# Patient Record
Sex: Female | Born: 1960
Health system: Southern US, Community
[De-identification: ages and names within clinical notes are randomized; demographics above are authoritative.]

## PROBLEM LIST (undated history)

## (undated) DIAGNOSIS — E7800 Pure hypercholesterolemia, unspecified: Secondary | ICD-10-CM

## (undated) DIAGNOSIS — J449 Chronic obstructive pulmonary disease, unspecified: Secondary | ICD-10-CM

## (undated) DIAGNOSIS — I10 Essential (primary) hypertension: Secondary | ICD-10-CM

## (undated) DIAGNOSIS — R7303 Prediabetes: Secondary | ICD-10-CM

## (undated) DIAGNOSIS — I509 Heart failure, unspecified: Secondary | ICD-10-CM

## (undated) DIAGNOSIS — I739 Peripheral vascular disease, unspecified: Secondary | ICD-10-CM

## (undated) DIAGNOSIS — R911 Solitary pulmonary nodule: Secondary | ICD-10-CM

## (undated) DIAGNOSIS — I6529 Occlusion and stenosis of unspecified carotid artery: Secondary | ICD-10-CM

## (undated) DIAGNOSIS — M1990 Unspecified osteoarthritis, unspecified site: Secondary | ICD-10-CM

## (undated) DIAGNOSIS — I701 Atherosclerosis of renal artery: Secondary | ICD-10-CM

## (undated) DIAGNOSIS — D649 Anemia, unspecified: Secondary | ICD-10-CM

## (undated) DIAGNOSIS — Z87442 Personal history of urinary calculi: Secondary | ICD-10-CM

## (undated) DIAGNOSIS — I2510 Atherosclerotic heart disease of native coronary artery without angina pectoris: Secondary | ICD-10-CM

## (undated) HISTORY — DX: Pure hypercholesterolemia, unspecified: E78.00

## (undated) HISTORY — DX: Peripheral vascular disease, unspecified: I73.9

## (undated) HISTORY — PX: CARDIAC CATHETERIZATION: SHX172

## (undated) HISTORY — PX: GALLBLADDER SURGERY: SHX652

## (undated) HISTORY — DX: Chronic obstructive pulmonary disease, unspecified: J44.9

## (undated) HISTORY — DX: Occlusion and stenosis of unspecified carotid artery: I65.29

## (undated) HISTORY — DX: Solitary pulmonary nodule: R91.1

## (undated) HISTORY — DX: Heart failure, unspecified: I50.9

## (undated) HISTORY — DX: Essential (primary) hypertension: I10

## (undated) HISTORY — PX: CHOLECYSTECTOMY: SHX55

## (undated) HISTORY — DX: Unspecified osteoarthritis, unspecified site: M19.90

## (undated) HISTORY — DX: Atherosclerotic heart disease of native coronary artery without angina pectoris: I25.10

## (undated) HISTORY — PX: EXTERNAL EAR SURGERY: SHX627

## (undated) HISTORY — DX: Anemia, unspecified: D64.9

## (undated) HISTORY — DX: Atherosclerosis of renal artery: I70.1

## (undated) HISTORY — PX: CORONARY STENT PLACEMENT: SHX1402

---

## 2000-09-29 ENCOUNTER — Encounter: Admission: RE | Admit: 2000-09-29 | Discharge: 2000-09-29 | Payer: Self-pay | Admitting: *Deleted

## 2000-09-29 ENCOUNTER — Encounter: Payer: Self-pay | Admitting: *Deleted

## 2000-10-07 ENCOUNTER — Encounter: Payer: Self-pay | Admitting: *Deleted

## 2000-10-07 ENCOUNTER — Encounter: Admission: RE | Admit: 2000-10-07 | Discharge: 2000-10-07 | Payer: Self-pay | Admitting: *Deleted

## 2001-05-17 ENCOUNTER — Ambulatory Visit (HOSPITAL_COMMUNITY): Admission: RE | Admit: 2001-05-17 | Discharge: 2001-05-17 | Payer: Self-pay | Admitting: Obstetrics and Gynecology

## 2001-05-17 ENCOUNTER — Encounter: Payer: Self-pay | Admitting: Obstetrics and Gynecology

## 2001-05-20 ENCOUNTER — Inpatient Hospital Stay (HOSPITAL_COMMUNITY): Admission: AD | Admit: 2001-05-20 | Discharge: 2001-05-20 | Payer: Self-pay | Admitting: Obstetrics and Gynecology

## 2001-05-31 ENCOUNTER — Ambulatory Visit (HOSPITAL_COMMUNITY): Admission: RE | Admit: 2001-05-31 | Discharge: 2001-05-31 | Payer: Self-pay | Admitting: Obstetrics and Gynecology

## 2001-05-31 ENCOUNTER — Encounter (INDEPENDENT_AMBULATORY_CARE_PROVIDER_SITE_OTHER): Payer: Self-pay

## 2002-10-21 ENCOUNTER — Encounter: Payer: Self-pay | Admitting: Obstetrics and Gynecology

## 2002-10-21 ENCOUNTER — Ambulatory Visit (HOSPITAL_COMMUNITY): Admission: RE | Admit: 2002-10-21 | Discharge: 2002-10-21 | Payer: Self-pay | Admitting: Obstetrics and Gynecology

## 2002-12-07 ENCOUNTER — Encounter: Payer: Self-pay | Admitting: Obstetrics and Gynecology

## 2002-12-07 ENCOUNTER — Ambulatory Visit (HOSPITAL_COMMUNITY): Admission: RE | Admit: 2002-12-07 | Discharge: 2002-12-07 | Payer: Self-pay | Admitting: Obstetrics and Gynecology

## 2005-04-25 ENCOUNTER — Ambulatory Visit (HOSPITAL_COMMUNITY): Admission: RE | Admit: 2005-04-25 | Discharge: 2005-04-25 | Payer: Self-pay | Admitting: Obstetrics and Gynecology

## 2005-06-09 DIAGNOSIS — I509 Heart failure, unspecified: Secondary | ICD-10-CM

## 2005-06-09 HISTORY — DX: Heart failure, unspecified: I50.9

## 2005-07-28 ENCOUNTER — Encounter: Admission: RE | Admit: 2005-07-28 | Discharge: 2005-07-28 | Payer: Self-pay | Admitting: Family Medicine

## 2005-08-29 ENCOUNTER — Encounter: Admission: RE | Admit: 2005-08-29 | Discharge: 2005-08-29 | Payer: Self-pay | Admitting: Family Medicine

## 2006-05-31 ENCOUNTER — Ambulatory Visit: Payer: Self-pay | Admitting: Internal Medicine

## 2006-05-31 ENCOUNTER — Inpatient Hospital Stay (HOSPITAL_COMMUNITY): Admission: EM | Admit: 2006-05-31 | Discharge: 2006-06-05 | Payer: Self-pay | Admitting: Emergency Medicine

## 2006-06-01 ENCOUNTER — Encounter: Payer: Self-pay | Admitting: Internal Medicine

## 2006-06-16 ENCOUNTER — Ambulatory Visit: Payer: Self-pay | Admitting: Gastroenterology

## 2006-06-23 ENCOUNTER — Ambulatory Visit: Payer: Self-pay | Admitting: Cardiology

## 2006-06-30 ENCOUNTER — Ambulatory Visit: Payer: Self-pay | Admitting: Gastroenterology

## 2006-07-07 ENCOUNTER — Ambulatory Visit (HOSPITAL_COMMUNITY): Admission: RE | Admit: 2006-07-07 | Discharge: 2006-07-07 | Payer: Self-pay | Admitting: Gastroenterology

## 2006-07-08 ENCOUNTER — Ambulatory Visit: Payer: Self-pay | Admitting: Cardiology

## 2006-07-08 LAB — CONVERTED CEMR LAB
BUN: 6 mg/dL (ref 6–23)
Basophils Relative: 0.9 % (ref 0.0–1.0)
CO2: 30 meq/L (ref 19–32)
Calcium: 9.8 mg/dL (ref 8.4–10.5)
Chloride: 107 meq/L (ref 96–112)
Creatinine, Ser: 1.1 mg/dL (ref 0.4–1.2)
Eosinophils Relative: 6.7 % — ABNORMAL HIGH (ref 0.0–5.0)
GFR calc Af Amer: 69 mL/min
INR: 1 (ref 0.9–2.0)
Monocytes Relative: 7.5 % (ref 3.0–11.0)
Platelets: 284 10*3/uL (ref 150–400)
Prothrombin Time: 12.3 s (ref 10.0–14.0)
RBC: 4.02 M/uL (ref 3.87–5.11)
RDW: 17.3 % — ABNORMAL HIGH (ref 11.5–14.6)
WBC: 7.6 10*3/uL (ref 4.5–10.5)

## 2006-07-09 ENCOUNTER — Ambulatory Visit: Payer: Self-pay | Admitting: Gastroenterology

## 2006-07-09 ENCOUNTER — Encounter: Admission: RE | Admit: 2006-07-09 | Discharge: 2006-07-09 | Payer: Self-pay | Admitting: *Deleted

## 2006-07-14 ENCOUNTER — Ambulatory Visit: Payer: Self-pay | Admitting: Cardiology

## 2006-07-15 ENCOUNTER — Ambulatory Visit: Payer: Self-pay | Admitting: Vascular Surgery

## 2006-07-15 ENCOUNTER — Inpatient Hospital Stay (HOSPITAL_COMMUNITY): Admission: AD | Admit: 2006-07-15 | Discharge: 2006-07-16 | Payer: Self-pay | Admitting: Cardiology

## 2006-07-23 ENCOUNTER — Ambulatory Visit: Payer: Self-pay | Admitting: Cardiology

## 2006-07-23 LAB — CONVERTED CEMR LAB
BUN: 15 mg/dL (ref 6–23)
CO2: 25 meq/L (ref 19–32)
Calcium: 9.7 mg/dL (ref 8.4–10.5)
Chloride: 107 meq/L (ref 96–112)
Creatinine, Ser: 1.2 mg/dL (ref 0.4–1.2)
GFR calc Af Amer: 63 mL/min

## 2006-07-29 ENCOUNTER — Ambulatory Visit: Payer: Self-pay | Admitting: *Deleted

## 2006-07-30 ENCOUNTER — Ambulatory Visit: Payer: Self-pay | Admitting: Cardiology

## 2006-07-30 ENCOUNTER — Ambulatory Visit: Payer: Self-pay

## 2006-07-31 ENCOUNTER — Encounter (INDEPENDENT_AMBULATORY_CARE_PROVIDER_SITE_OTHER): Payer: Self-pay | Admitting: Specialist

## 2006-07-31 ENCOUNTER — Inpatient Hospital Stay (HOSPITAL_COMMUNITY): Admission: RE | Admit: 2006-07-31 | Discharge: 2006-08-02 | Payer: Self-pay | Admitting: *Deleted

## 2006-08-06 ENCOUNTER — Ambulatory Visit: Payer: Self-pay

## 2006-08-20 ENCOUNTER — Ambulatory Visit: Payer: Self-pay | Admitting: *Deleted

## 2006-09-08 ENCOUNTER — Emergency Department (HOSPITAL_COMMUNITY): Admission: EM | Admit: 2006-09-08 | Discharge: 2006-09-08 | Payer: Self-pay | Admitting: Emergency Medicine

## 2006-10-02 ENCOUNTER — Encounter: Admission: RE | Admit: 2006-10-02 | Discharge: 2006-10-02 | Payer: Self-pay | Admitting: Family Medicine

## 2007-01-05 ENCOUNTER — Ambulatory Visit: Payer: Self-pay | Admitting: Cardiology

## 2007-01-05 LAB — CONVERTED CEMR LAB
BUN: 12 mg/dL (ref 6–23)
CO2: 28 meq/L (ref 19–32)
Calcium: 9.8 mg/dL (ref 8.4–10.5)
Chloride: 102 meq/L (ref 96–112)
GFR calc Af Amer: 87 mL/min
GFR calc non Af Amer: 72 mL/min
Ketones, ur: NEGATIVE mg/dL
Leukocytes, UA: NEGATIVE
Nitrite: NEGATIVE
Urobilinogen, UA: 0.2 (ref 0.0–1.0)

## 2007-01-26 ENCOUNTER — Ambulatory Visit: Payer: Self-pay

## 2007-01-26 ENCOUNTER — Encounter: Payer: Self-pay | Admitting: Cardiology

## 2007-01-28 ENCOUNTER — Ambulatory Visit: Payer: Self-pay | Admitting: Internal Medicine

## 2007-02-04 ENCOUNTER — Ambulatory Visit: Payer: Self-pay

## 2007-03-04 ENCOUNTER — Ambulatory Visit: Payer: Self-pay | Admitting: *Deleted

## 2007-03-08 ENCOUNTER — Ambulatory Visit: Payer: Self-pay | Admitting: Cardiovascular Disease

## 2007-04-01 ENCOUNTER — Ambulatory Visit: Payer: Self-pay | Admitting: Cardiology

## 2007-04-01 ENCOUNTER — Inpatient Hospital Stay (HOSPITAL_COMMUNITY): Admission: EM | Admit: 2007-04-01 | Discharge: 2007-04-04 | Payer: Self-pay | Admitting: Emergency Medicine

## 2007-04-19 ENCOUNTER — Ambulatory Visit: Payer: Self-pay | Admitting: Internal Medicine

## 2007-04-22 ENCOUNTER — Ambulatory Visit (HOSPITAL_BASED_OUTPATIENT_CLINIC_OR_DEPARTMENT_OTHER): Admission: RE | Admit: 2007-04-22 | Discharge: 2007-04-22 | Payer: Self-pay | Admitting: Urology

## 2007-04-26 ENCOUNTER — Ambulatory Visit: Payer: Self-pay | Admitting: Internal Medicine

## 2007-04-26 LAB — CONVERTED CEMR LAB
AST: 16 units/L (ref 0–37)
Bilirubin, Direct: 0.1 mg/dL (ref 0.0–0.3)
Chloride: 102 meq/L (ref 96–112)
Cholesterol: 167 mg/dL (ref 0–200)
Creatinine, Ser: 1.2 mg/dL (ref 0.4–1.2)
Glucose, Bld: 109 mg/dL — ABNORMAL HIGH (ref 70–99)
HDL: 34.1 mg/dL — ABNORMAL LOW (ref >39.0)
LDL Cholesterol: 96 mg/dL (ref 0–99)
Sodium: 137 meq/L (ref 135–145)
Total Bilirubin: 0.9 mg/dL (ref 0.3–1.2)
Total Protein: 7.6 g/dL (ref 6.0–8.3)

## 2007-05-24 ENCOUNTER — Ambulatory Visit: Payer: Self-pay | Admitting: *Deleted

## 2007-05-24 ENCOUNTER — Encounter (INDEPENDENT_AMBULATORY_CARE_PROVIDER_SITE_OTHER): Payer: Self-pay | Admitting: *Deleted

## 2007-05-24 ENCOUNTER — Inpatient Hospital Stay (HOSPITAL_COMMUNITY): Admission: RE | Admit: 2007-05-24 | Discharge: 2007-05-25 | Payer: Self-pay | Admitting: *Deleted

## 2007-06-17 ENCOUNTER — Ambulatory Visit: Payer: Self-pay | Admitting: *Deleted

## 2007-07-28 ENCOUNTER — Ambulatory Visit: Payer: Self-pay | Admitting: Internal Medicine

## 2007-07-28 LAB — CONVERTED CEMR LAB
Basophils Relative: 0.8 % (ref 0.0–1.0)
CO2: 28 meq/L (ref 19–32)
Calcium: 9.6 mg/dL (ref 8.4–10.5)
Chloride: 101 meq/L (ref 96–112)
Eosinophils Absolute: 0.3 10*3/uL (ref 0.0–0.6)
Eosinophils Relative: 3.5 % (ref 0.0–5.0)
GFR calc non Af Amer: 32 mL/min
Glucose, Bld: 103 mg/dL — ABNORMAL HIGH (ref 70–99)
Platelets: 372 10*3/uL (ref 150–400)
RBC: 3.96 M/uL (ref 3.87–5.11)
WBC: 8.8 10*3/uL (ref 4.5–10.5)

## 2007-08-17 ENCOUNTER — Ambulatory Visit: Payer: Self-pay | Admitting: Internal Medicine

## 2007-08-17 ENCOUNTER — Ambulatory Visit: Payer: Self-pay | Admitting: Cardiovascular Disease

## 2007-08-17 LAB — CONVERTED CEMR LAB
Chloride: 106 meq/L (ref 96–112)
Creatinine, Ser: 1.2 mg/dL (ref 0.4–1.2)
Sodium: 140 meq/L (ref 135–145)

## 2007-09-13 ENCOUNTER — Ambulatory Visit: Payer: Self-pay

## 2007-09-21 ENCOUNTER — Ambulatory Visit: Payer: Self-pay | Admitting: Internal Medicine

## 2007-09-22 ENCOUNTER — Ambulatory Visit: Payer: Self-pay | Admitting: Cardiovascular Disease

## 2007-09-22 LAB — CONVERTED CEMR LAB
Basophils Absolute: 0 10*3/uL (ref 0.0–0.1)
Calcium: 9.3 mg/dL (ref 8.4–10.5)
Cholesterol: 154 mg/dL (ref 0–200)
Eosinophils Absolute: 0.2 10*3/uL (ref 0.0–0.7)
GFR calc Af Amer: 57 mL/min
GFR calc non Af Amer: 47 mL/min
HCT: 38.7 % (ref 36.0–46.0)
HDL: 37.1 mg/dL — ABNORMAL LOW (ref >39.0)
INR: 1 (ref 0.8–1.0)
LDL Cholesterol: 95 mg/dL (ref 0–99)
MCHC: 33.5 g/dL (ref 30.0–36.0)
MCV: 93.3 fL (ref 78.0–100.0)
Monocytes Absolute: 0.8 10*3/uL (ref 0.1–1.0)
Monocytes Relative: 11.8 % (ref 3.0–12.0)
Platelets: 288 10*3/uL (ref 150–400)
Potassium: 4.2 meq/L (ref 3.5–5.1)
RDW: 12.6 % (ref 11.5–14.6)
Sodium: 141 meq/L (ref 135–145)
VLDL: 22 mg/dL (ref 0–40)
aPTT: 30.3 s — ABNORMAL HIGH (ref 21.7–29.8)

## 2007-09-29 ENCOUNTER — Ambulatory Visit: Payer: Self-pay | Admitting: Cardiovascular Disease

## 2007-09-29 ENCOUNTER — Observation Stay (HOSPITAL_COMMUNITY): Admission: RE | Admit: 2007-09-29 | Discharge: 2007-09-30 | Payer: Self-pay | Admitting: Cardiovascular Disease

## 2007-10-15 ENCOUNTER — Ambulatory Visit: Payer: Self-pay

## 2007-10-19 ENCOUNTER — Ambulatory Visit: Payer: Self-pay | Admitting: Cardiovascular Disease

## 2007-11-25 ENCOUNTER — Ambulatory Visit: Payer: Self-pay | Admitting: *Deleted

## 2008-02-03 ENCOUNTER — Ambulatory Visit: Payer: Self-pay | Admitting: *Deleted

## 2008-03-15 ENCOUNTER — Observation Stay (HOSPITAL_COMMUNITY): Admission: RE | Admit: 2008-03-15 | Discharge: 2008-03-15 | Payer: Self-pay | Admitting: *Deleted

## 2008-03-15 ENCOUNTER — Ambulatory Visit: Payer: Self-pay | Admitting: *Deleted

## 2008-03-30 ENCOUNTER — Ambulatory Visit: Payer: Self-pay | Admitting: *Deleted

## 2008-04-13 ENCOUNTER — Ambulatory Visit: Payer: Self-pay

## 2008-05-03 ENCOUNTER — Ambulatory Visit: Payer: Self-pay | Admitting: Cardiovascular Disease

## 2008-05-10 ENCOUNTER — Ambulatory Visit: Payer: Self-pay | Admitting: *Deleted

## 2008-05-10 ENCOUNTER — Inpatient Hospital Stay (HOSPITAL_COMMUNITY): Admission: RE | Admit: 2008-05-10 | Discharge: 2008-05-17 | Payer: Self-pay | Admitting: *Deleted

## 2008-05-10 ENCOUNTER — Ambulatory Visit: Payer: Self-pay | Admitting: Internal Medicine

## 2008-05-11 ENCOUNTER — Encounter (INDEPENDENT_AMBULATORY_CARE_PROVIDER_SITE_OTHER): Payer: Self-pay | Admitting: *Deleted

## 2008-05-25 ENCOUNTER — Ambulatory Visit: Payer: Self-pay | Admitting: *Deleted

## 2008-06-22 ENCOUNTER — Ambulatory Visit: Payer: Self-pay | Admitting: *Deleted

## 2008-07-07 ENCOUNTER — Ambulatory Visit: Payer: Self-pay | Admitting: Cardiovascular Disease

## 2008-09-13 ENCOUNTER — Ambulatory Visit: Payer: Self-pay | Admitting: *Deleted

## 2008-10-09 ENCOUNTER — Encounter: Payer: Self-pay | Admitting: Cardiovascular Disease

## 2008-10-09 DIAGNOSIS — I701 Atherosclerosis of renal artery: Secondary | ICD-10-CM | POA: Insufficient documentation

## 2008-10-09 DIAGNOSIS — I70219 Atherosclerosis of native arteries of extremities with intermittent claudication, unspecified extremity: Secondary | ICD-10-CM | POA: Insufficient documentation

## 2008-10-18 ENCOUNTER — Ambulatory Visit: Payer: Self-pay

## 2008-10-18 ENCOUNTER — Encounter: Payer: Self-pay | Admitting: Cardiovascular Disease

## 2008-10-20 ENCOUNTER — Ambulatory Visit: Payer: Self-pay | Admitting: Internal Medicine

## 2008-10-20 ENCOUNTER — Telehealth: Payer: Self-pay | Admitting: Cardiovascular Disease

## 2008-10-20 DIAGNOSIS — I428 Other cardiomyopathies: Secondary | ICD-10-CM | POA: Insufficient documentation

## 2008-10-20 DIAGNOSIS — J449 Chronic obstructive pulmonary disease, unspecified: Secondary | ICD-10-CM | POA: Insufficient documentation

## 2008-10-20 DIAGNOSIS — E7800 Pure hypercholesterolemia, unspecified: Secondary | ICD-10-CM | POA: Insufficient documentation

## 2008-10-31 ENCOUNTER — Telehealth: Payer: Self-pay | Admitting: Cardiovascular Disease

## 2008-10-31 LAB — CONVERTED CEMR LAB
AST: 22 units/L (ref 0–37)
Alkaline Phosphatase: 107 units/L (ref 39–117)
Calcium: 9.5 mg/dL (ref 8.4–10.5)
Cholesterol: 134 mg/dL (ref 0–200)
GFR calc non Af Amer: 56.54 mL/min (ref >60)
HDL: 34 mg/dL — ABNORMAL LOW (ref >39.00)
LDL Cholesterol: 67 mg/dL (ref 0–99)
Potassium: 4.3 meq/L (ref 3.5–5.1)
Sodium: 141 meq/L (ref 135–145)
Total Bilirubin: 0.9 mg/dL (ref 0.3–1.2)
VLDL: 32.6 mg/dL (ref 0.0–40.0)

## 2008-11-03 DIAGNOSIS — I10 Essential (primary) hypertension: Secondary | ICD-10-CM | POA: Insufficient documentation

## 2008-11-03 DIAGNOSIS — D539 Nutritional anemia, unspecified: Secondary | ICD-10-CM | POA: Insufficient documentation

## 2008-11-03 DIAGNOSIS — I2510 Atherosclerotic heart disease of native coronary artery without angina pectoris: Secondary | ICD-10-CM | POA: Insufficient documentation

## 2008-11-03 DIAGNOSIS — J984 Other disorders of lung: Secondary | ICD-10-CM | POA: Insufficient documentation

## 2008-11-03 DIAGNOSIS — I6529 Occlusion and stenosis of unspecified carotid artery: Secondary | ICD-10-CM | POA: Insufficient documentation

## 2008-11-07 ENCOUNTER — Ambulatory Visit: Payer: Self-pay | Admitting: Cardiovascular Disease

## 2008-11-09 ENCOUNTER — Encounter: Payer: Self-pay | Admitting: Internal Medicine

## 2008-11-22 ENCOUNTER — Telehealth (INDEPENDENT_AMBULATORY_CARE_PROVIDER_SITE_OTHER): Payer: Self-pay | Admitting: *Deleted

## 2009-04-06 ENCOUNTER — Encounter: Payer: Self-pay | Admitting: Cardiovascular Disease

## 2009-04-09 ENCOUNTER — Ambulatory Visit: Payer: Self-pay

## 2009-04-09 ENCOUNTER — Ambulatory Visit: Payer: Self-pay | Admitting: Cardiovascular Disease

## 2009-04-18 LAB — CONVERTED CEMR LAB
BUN: 13 mg/dL (ref 6–23)
Calcium: 9 mg/dL (ref 8.4–10.5)
GFR calc non Af Amer: 56.43 mL/min (ref >60)
Glucose, Bld: 105 mg/dL — ABNORMAL HIGH (ref 70–99)
Potassium: 4.1 meq/L (ref 3.5–5.1)
Sodium: 142 meq/L (ref 135–145)

## 2009-04-19 ENCOUNTER — Ambulatory Visit: Payer: Self-pay | Admitting: Vascular Surgery

## 2009-04-27 ENCOUNTER — Encounter (INDEPENDENT_AMBULATORY_CARE_PROVIDER_SITE_OTHER): Payer: Self-pay | Admitting: *Deleted

## 2009-05-01 ENCOUNTER — Ambulatory Visit: Payer: Self-pay | Admitting: Cardiovascular Disease

## 2009-05-01 ENCOUNTER — Encounter (INDEPENDENT_AMBULATORY_CARE_PROVIDER_SITE_OTHER): Payer: Self-pay

## 2009-05-16 ENCOUNTER — Inpatient Hospital Stay (HOSPITAL_COMMUNITY): Admission: RE | Admit: 2009-05-16 | Discharge: 2009-05-17 | Payer: Self-pay | Admitting: Cardiovascular Disease

## 2009-05-16 ENCOUNTER — Ambulatory Visit: Payer: Self-pay | Admitting: Cardiovascular Disease

## 2009-05-18 ENCOUNTER — Encounter (INDEPENDENT_AMBULATORY_CARE_PROVIDER_SITE_OTHER): Payer: Self-pay | Admitting: *Deleted

## 2009-05-25 ENCOUNTER — Ambulatory Visit: Payer: Self-pay | Admitting: Cardiovascular Disease

## 2009-06-06 ENCOUNTER — Telehealth: Payer: Self-pay | Admitting: Cardiovascular Disease

## 2009-08-15 ENCOUNTER — Telehealth: Payer: Self-pay | Admitting: Cardiovascular Disease

## 2009-10-09 ENCOUNTER — Ambulatory Visit: Payer: Self-pay | Admitting: Vascular Surgery

## 2009-10-29 ENCOUNTER — Telehealth (INDEPENDENT_AMBULATORY_CARE_PROVIDER_SITE_OTHER): Payer: Self-pay | Admitting: *Deleted

## 2009-10-30 ENCOUNTER — Telehealth (INDEPENDENT_AMBULATORY_CARE_PROVIDER_SITE_OTHER): Payer: Self-pay | Admitting: *Deleted

## 2009-11-09 ENCOUNTER — Encounter: Payer: Self-pay | Admitting: Cardiovascular Disease

## 2009-11-12 ENCOUNTER — Ambulatory Visit: Payer: Self-pay

## 2009-11-12 ENCOUNTER — Encounter: Payer: Self-pay | Admitting: Cardiovascular Disease

## 2009-12-04 ENCOUNTER — Ambulatory Visit: Payer: Self-pay | Admitting: Cardiovascular Disease

## 2009-12-24 ENCOUNTER — Ambulatory Visit: Payer: Self-pay | Admitting: Vascular Surgery

## 2010-01-02 ENCOUNTER — Ambulatory Visit: Payer: Self-pay | Admitting: Cardiovascular Disease

## 2010-01-02 ENCOUNTER — Ambulatory Visit (HOSPITAL_COMMUNITY): Admission: RE | Admit: 2010-01-02 | Discharge: 2010-01-02 | Payer: Self-pay | Admitting: Cardiovascular Disease

## 2010-01-10 ENCOUNTER — Ambulatory Visit: Payer: Self-pay | Admitting: Vascular Surgery

## 2010-03-13 ENCOUNTER — Encounter: Payer: Self-pay | Admitting: Cardiovascular Disease

## 2010-03-14 ENCOUNTER — Ambulatory Visit: Payer: Self-pay

## 2010-03-14 ENCOUNTER — Ambulatory Visit: Payer: Self-pay | Admitting: Cardiovascular Disease

## 2010-03-20 LAB — CONVERTED CEMR LAB
ALT: 23 units/L (ref 0–35)
AST: 24 units/L (ref 0–37)
Bilirubin, Direct: 0.1 mg/dL (ref 0.0–0.3)
Cholesterol: 153 mg/dL (ref 0–200)
Total Bilirubin: 0.4 mg/dL (ref 0.3–1.2)
Total Protein: 7.6 g/dL (ref 6.0–8.3)
VLDL: 26.8 mg/dL (ref 0.0–40.0)

## 2010-05-22 ENCOUNTER — Encounter: Payer: Self-pay | Admitting: Cardiovascular Disease

## 2010-07-02 ENCOUNTER — Encounter: Payer: Self-pay | Admitting: Cardiovascular Disease

## 2010-07-02 ENCOUNTER — Ambulatory Visit: Admission: RE | Admit: 2010-07-02 | Discharge: 2010-07-02 | Payer: Self-pay | Source: Home / Self Care

## 2010-07-09 NOTE — Miscellaneous (Signed)
Summary: Orders Update  Clinical Lists Changes  Orders: Added new Test order of Arterial Duplex Lower Extremity (Arterial Duplex Low) - Signed 

## 2010-07-09 NOTE — Progress Notes (Signed)
  Phone Note Other Incoming   Request: Send information Summary of Call: Request for records received from [facility]. Request forwarded to [employer].      Appended Document:  [employer] Request Recieved sent to [facility]

## 2010-07-09 NOTE — Letter (Signed)
Summary: Peripheral Vascular  [facility], Main Office  [address]   [place], [place] [address]   Phone: [phone]  Fax: [phone]     12/04/2009 MRN: [id number]  Brenda [name] [address] [place], [place]  [address]  Dear Ms. Spradlin,   You are scheduled for Peripheral Vascular Angiogram on Wednesday January 02, 2010 with Dr. Excell Seltzer.  Please arrive at the [facility] at 6:30      a.m. on the day of your procedure.  1. DIET     _X___ Nothing to eat or drink after midnight except your medications with a sip of water.  2. MAKE SURE YOU TAKE YOUR ASPIRIN AND PLAVIX.  3. _X____ DO NOT TAKE these medications before your procedure:        Do not take Furosemide the morning of procedure.        __X__ YOU MAY TAKE ALL of your remaining medications with a small amount of water.       4. Plan for one night stay - bring personal belongings (i.e. toothpaste, toothbrush, etc.)  5. Bring a current list of your medications and current insurance cards.  6. Must have a responsible person to drive you home.   7. Someone must be with you for the first 24 hours after you arrive home.  8. Please wear clothes that are easy to get on and off and wear slip-on shoes.  *Special note: Every effort is made to have your procedure done on time.  Occasionally there are emergencies that present themselves at the hospital that may cause delays.  Please be patient if a delay does occur.  If you have any questions after you get home, please call the office at the number listed above.   Julieta Gutting, RN, BSN

## 2010-07-09 NOTE — Assessment & Plan Note (Signed)
Summary: 6 MO F/U   Visit Type:  6 months follow up  CC:  No complains.  History of Present Illness: 50 year-old woman with extensive peripheral vascular disease presents for follow-up evaluation today. She has lower extremity PAD, bilateral subclavian and extracrainial carotid stenosis, and renal artery stenosis. She underwent treatment of left renal in-stent restenosis earlier this year with a covered stent.   The patient's main problem at present is right leg claudication. She complains of pain in the right thigh and calf with low-level walking. She denies rest pain or ulceration. At the time of her last angiogram, she was noted to have severe right iliac stenosis.  She denies chest pain but does admit to exertional dyspnea. She complains of headache but denies focal neurologic symptoms.  Current Medications (verified): 1)  Bayer Aspirin 325 Mg Tabs (Aspirin) .... Take One Tablet Once Daily 2)  Astepro 137 Mcg/spray Soln (Azelastine Hcl) .... One Spray Each Nostril As Needed 3)  Tylenol Extra Strength 500 Mg Tabs (Acetaminophen) .... As Needed 4)  Simvastatin 80 Mg Tabs (Simvastatin) .... Take One Tablet Once Daily 5)  Cvs Pain Relief Pm Ex St 25-500 Mg Tabs (Diphenhydramine-Apap (Sleep)) .... Take One To Two Tablets As Needed For Pain 6)  Carvedilol 3.125 Mg Tabs (Carvedilol) .... Take One Tablet Two Times A Day 7)  Klor-Con M20 20 Meq Cr-Tabs (Potassium Chloride Crys Cr) .... Take One Tablet By Mouth Every Other Day 8)  Furosemide 20 Mg Tabs (Furosemide) .... Take One Tablet By Mouth Every Other Day. 9)  Diovan 80 Mg Tabs (Valsartan) .... Take One Tablet By Mouth Daily 10)  Plavix 75 Mg Tabs (Clopidogrel Bisulfate) .... Take One Tablet By Mouth Daily  Allergies: 1)  Lisinopril (Lisinopril)  Past History:  Past medical history reviewed for relevance to current acute and chronic problems.  Past Medical History: Current Problems:  HYPERTENSION (ICD-401.9) - difficult to assess  because of occlusive disease in all extremities. CAD (ICD-414.00)-Non 0bstructive CAROTID ARTERY OCCLUSION (ICD-433.10) RENAL ARTERY STENOSIS (ICD-440.1), multiple bilateral renal PTA/stent procedures PERIPHERAL VASCULAR DISEASE (ICD-443.9) HYPERCHOLESTEROLEMIA (ICD-272.0) CARDIOMYOPATHY (ICD-425.4) COPD (ICD-496) PULMONARY NODULE, SOLITARY (ICD-518.89) ANEMIA, CHRONIC (ICD-281.9)  Review of Systems       Negative except as per HPI   Vital Signs:  Patient profile:   50 year old female Height:      65 inches Weight:      203 pounds BMI:     33.90 Pulse rate:   77 / minute Pulse rhythm:   regular Resp:     18 per minute BP sitting:   90 / 80  (left arm) Cuff size:   large  Vitals Entered By: Vikki Ports (December 04, 2009 12:11 PM)  Serial Vital Signs/Assessments:  Time      Position  BP       Pulse  Resp  Temp     By           R Arm     90/80                          Vikki Ports   Physical Exam  General:  Pt is alert and oriented, in no acute distress. HEENT: normal Neck: normal carotid upstrokes with bilateral bruits, JVP normal Lungs: CTA CV: RRR without murmur or gallop Abd: soft, NT, positive BS, no bruit, no organomegaly Ext: no clubbing, cyanosis, or edema. femoral pulses trace on right and 1+ on left. pedal  pulses absent on right and diminished on left Skin: warm and dry without rash    Arterial Doppler  Procedure date:  11/13/2009  Findings:      patent covered stent in right renal artery with moderately elevated velocities, severe left renal artery stenosis with severely elevated velocities. Severely elevated velocities in right common iliac artery and moderately elevated velocities in both external iliac arteries.  Impression & Recommendations:  Problem # 1:  PERIPHERAL VASCULAR DISEASE (ICD-443.9) Pt with severe lifestyle limiting claudication of the right leg with severe common iliac stenosis on the right. She also has moderate bilateral external  iliac stenosis. I have recommended proceeding with angiography with plans for PTA of the right common iliac to relieve symptoms and improve functional capacity. Risks and indications reviewed with the patient who agrees to proceed.  Problem # 2:  RENAL ARTERY STENOSIS (ICD-440.1) Appears to have progressive left renal artery stenosis and some increase in velocity across the right renal artery. Will repeat angiography at the time of lower extremity angio and consider PTA if indicated.  Orders: EKG w/ Interpretation (93000) PV Procedure (PV Procedure)  Problem # 3:  HYPERTENSION (ICD-401.9) Very difficult to treat because of arterial occlusive disease of all 4 extremities...we really don't know her central BP but assume it is at least 50 mmHg higher than arm or leg readings based on past measurements at the time of angio. Continue current Rx for now.  Her updated medication list for this problem includes:    Bayer Aspirin 325 Mg Tabs (Aspirin) .Marland Kitchen... Take one tablet once daily    Carvedilol 3.125 Mg Tabs (Carvedilol) .Marland Kitchen... Take one tablet two times a day    Furosemide 20 Mg Tabs (Furosemide) .Marland Kitchen... Take one tablet by mouth every other day.    Diovan 80 Mg Tabs (Valsartan) .Marland Kitchen... Take one tablet by mouth daily  Problem # 4:  HYPERCHOLESTEROLEMIA (ICD-272.0) Due for repeat lipids...will check when she comes in for pre-angio labs.  Her updated medication list for this problem includes:    Simvastatin 80 Mg Tabs (Simvastatin) .Marland Kitchen... Take one tablet once daily  CHOL: 134 (10/20/2008)   LDL: 67 (10/20/2008)   HDL: 34.00 (10/20/2008)   TG: 163.0 (10/20/2008)  Patient Instructions: 1)  Your physician has requested that you have a peripheral vascular angiogram. This exam is performed at the hospital. During this exam IV contrast is used to look at arterial blood flow.  Please review the information sheet given for details. 2)  Your physician recommends that you continue on your current medications as  directed. Please refer to the Current Medication list given to you today.

## 2010-07-09 NOTE — Progress Notes (Signed)
Summary: Low back pain  Phone Note Call from Patient Call back at Home Phone [phone]   Caller: Patient Reason for Call: Talk to Nurse Summary of Call: request call back from nurse, would not state what she needed, just that she will explain to the nurse Initial call taken by: Migdalia Dk,  August 15, 2009 11:36 AM  Follow-up for Phone Call        I spoke with the pt and she is c/o right sided low back pain.  This pain started last Thursday.  The pt has been drinking plenty of water and taking Tylenol for pain.  The pt does have a history of kidney stones and this is the same type of pain she has previously felt with kidney stones.  I asked the pt to call her PCP for an appointment.  The pt agrees with plan.  Follow-up by: Julieta Gutting, RN, BSN,  August 15, 2009 12:38 PM

## 2010-07-09 NOTE — Progress Notes (Signed)
  Phone Note Other Incoming   Request: Send information Summary of Call: Request for records received from [facility]. Request forwarded to [employer].      Appended Document:  Request Received from [facility] sent to [facility]

## 2010-07-09 NOTE — Miscellaneous (Signed)
Summary: Orders Update  Clinical Lists Changes  Orders: Added new Test order of Renal Artery Duplex (Renal Artery Duplex) - Signed 

## 2010-07-09 NOTE — Assessment & Plan Note (Signed)
Summary: rov   Visit Type:  Follow-up  CC:  No complaints.  History of Present Illness: 50 year-old woman with extensive peripheral vascular disease presents for follow-up evaluation today. She has lower extremity PAD, bilateral subclavian and extracrainial carotid stenosis, and renal artery stenosis. She presents today for followup after recent iliac stenting. She has been inactive of late, but denies leg pain with ambulation during normal activity. She denies chest pain or dyspnea. No new complaints today.   Current Medications (verified): 1)  Bayer Aspirin 325 Mg Tabs (Aspirin) .... Take One Tablet Once Daily 2)  Astepro 137 Mcg/spray Soln (Azelastine Hcl) .... One Spray Each Nostril As Needed 3)  Tylenol Extra Strength 500 Mg Tabs (Acetaminophen) .... As Needed 4)  Simvastatin 80 Mg Tabs (Simvastatin) .... Take One Tablet Once Daily 5)  Cvs Pain Relief Pm Ex St 25-500 Mg Tabs (Diphenhydramine-Apap (Sleep)) .... Take One To Two Tablets As Needed For Pain 6)  Carvedilol 3.125 Mg Tabs (Carvedilol) .... Take One Tablet Two Times A Day 7)  Klor-Con M20 20 Meq Cr-Tabs (Potassium Chloride Crys Cr) .... Take One Tablet By Mouth Every Other Day 8)  Furosemide 20 Mg Tabs (Furosemide) .... Take One Tablet By Mouth Every Other Day. 9)  Diovan 80 Mg Tabs (Valsartan) .... Take One Tablet By Mouth Daily 10)  Plavix 75 Mg Tabs (Clopidogrel Bisulfate) .... Take One Tablet By Mouth Daily  Allergies: 1)  Lisinopril (Lisinopril)  Past History:  Past medical history reviewed for relevance to current acute and chronic problems.  Past Medical History: Current Problems:  HYPERTENSION (ICD-401.9) - difficult to assess because of occlusive disease in all extremities. CAD (ICD-414.00)-Non 0bstructive CAROTID ARTERY OCCLUSION (ICD-433.10) RENAL ARTERY STENOSIS (ICD-440.1), multiple bilateral renal PTA/stent procedures PERIPHERAL VASCULAR DISEASE (ICD-443.9) s/p bilateral iliac  stenting HYPERCHOLESTEROLEMIA (ICD-272.0) CARDIOMYOPATHY (ICD-425.4) COPD (ICD-496) PULMONARY NODULE, SOLITARY (ICD-518.89) ANEMIA, CHRONIC (ICD-281.9)  Review of Systems       Negative except as per HPI   Vital Signs:  Patient profile:   50 year old female Height:      65 inches Weight:      208.25 pounds BMI:     34.78 Pulse rate:   72 / minute Pulse rhythm:   regular Resp:     18 per minute BP sitting:   94 / 80  (right arm) Cuff size:   large  Vitals Entered By: Vikki Ports (March 14, 2010 12:07 PM)  Physical Exam  General:  Pt is alert and oriented, obese woman in no acute distress. HEENT: normal Neck: normal carotid upstrokes with bilateral bruits, JVP normal Lungs: CTA CV: RRR without murmur or gallop Abd: soft, NT, positive BS, no bruit, no organomegaly Ext: no clubbing, cyanosis, or edema. femoral pulses1+ bilaterally, pedals 1+ bilaterally Skin: warm and dry without rash    ABI's  Procedure date:  03/14/2010  Findings:      ABI 0.77 right and 0.89 left - better amplitude of ankle waveforms compared with prior studies.  Impression & Recommendations:  Problem # 1:  PERIPHERAL VASCULAR DISEASE (ICD-443.9) Pt stable following iliac intervention. Recommend continue dual antiplatelet Rx with aspirin and plavix, increase walking program with initiation of regular exercise. Continue risk factor modification as outlined.  Problem # 2:  CAROTID ARTERY OCCLUSION (ICD-433.10) No neurologic symptoms at present. S/P carotid bypass by [facility] group.  Her updated medication list for this problem includes:    Bayer Aspirin 325 Mg Tabs (Aspirin) .Marland Kitchen... Take one tablet once daily  Plavix 75 Mg Tabs (Clopidogrel bisulfate) .Marland Kitchen... Take one tablet by mouth daily  Problem # 3:  RENAL ARTERY STENOSIS (ICD-440.1) S/P multiple renal interventions and covered stent placement with demonstrated patency at most recent angio.  Other Orders: TLB-Lipid Panel  (80061-LIPID) TLB-Hepatic/Liver Function Pnl (80076-HEPATIC)  Patient Instructions: 1)  Your physician recommends that you continue on your current medications as directed. Please refer to the Current Medication list given to you today. 2)  Your physician wants you to follow-up in: 6 MONTHS.  You will receive a reminder letter in the mail two months in advance. If you don't receive a letter, please call our office to schedule the follow-up appointment. 3)  Your physician recommends that you have a FASTING Lipid and Liver Profile today.

## 2010-07-11 NOTE — Miscellaneous (Signed)
Summary: Orders Update  Clinical Lists Changes  Orders: Added new Test order of Arterial Duplex Lower Extremity (Arterial Duplex Low) - Signed 

## 2010-07-11 NOTE — Letter (Signed)
Summary: [facility] - Office Visit  [facility] - Office Visit   Imported By: Marylou Mccoy 06/18/2010 11:11:24  _____________________________________________________________________  External Attachment:    Type:   Image     Comment:   External Document

## 2010-08-24 LAB — LIPID PANEL
HDL: 38 mg/dL — ABNORMAL LOW (ref >39)
Total CHOL/HDL Ratio: 3.4 RATIO
Triglycerides: 215 mg/dL — ABNORMAL HIGH (ref <150)
VLDL: 43 mg/dL — ABNORMAL HIGH (ref 0–40)

## 2010-08-24 LAB — BASIC METABOLIC PANEL
BUN: 13 mg/dL (ref 6–23)
Calcium: 9.5 mg/dL (ref 8.4–10.5)
Creatinine, Ser: 0.99 mg/dL (ref 0.4–1.2)
GFR calc non Af Amer: 60 mL/min — ABNORMAL LOW (ref >60)
Glucose, Bld: 105 mg/dL — ABNORMAL HIGH (ref 70–99)
Potassium: 4 mEq/L (ref 3.5–5.1)

## 2010-08-24 LAB — CBC
MCH: 31.5 pg (ref 26.0–34.0)
MCHC: 34.2 g/dL (ref 30.0–36.0)
Platelets: 260 10*3/uL (ref 150–400)
RDW: 14.1 % (ref 11.5–15.5)

## 2010-08-24 LAB — HEPATIC FUNCTION PANEL
Albumin: 3.7 g/dL (ref 3.5–5.2)
Alkaline Phosphatase: 94 U/L (ref 39–117)
Indirect Bilirubin: 0.3 mg/dL (ref 0.3–0.9)
Total Protein: 7.4 g/dL (ref 6.0–8.3)

## 2010-08-24 LAB — PROTIME-INR: INR: 1 (ref 0.00–1.49)

## 2010-09-08 ENCOUNTER — Other Ambulatory Visit: Payer: Self-pay | Admitting: Internal Medicine

## 2010-09-10 LAB — BASIC METABOLIC PANEL
BUN: 13 mg/dL (ref 6–23)
BUN: 9 mg/dL (ref 6–23)
CO2: 26 mEq/L (ref 19–32)
CO2: 27 mEq/L (ref 19–32)
Chloride: 104 mEq/L (ref 96–112)
Creatinine, Ser: 0.89 mg/dL (ref 0.4–1.2)
Glucose, Bld: 100 mg/dL — ABNORMAL HIGH (ref 70–99)
Potassium: 3.9 mEq/L (ref 3.5–5.1)
Potassium: 4.3 mEq/L (ref 3.5–5.1)
Sodium: 138 mEq/L (ref 135–145)

## 2010-09-10 LAB — CBC
HCT: 34.5 % — ABNORMAL LOW (ref 36.0–46.0)
HCT: 42.7 % (ref 36.0–46.0)
Hemoglobin: 14.4 g/dL (ref 12.0–15.0)
MCV: 92.9 fL (ref 78.0–100.0)
Platelets: 219 10*3/uL (ref 150–400)
RDW: 14 % (ref 11.5–15.5)
RDW: 14.1 % (ref 11.5–15.5)
WBC: 8.1 10*3/uL (ref 4.0–10.5)

## 2010-09-27 ENCOUNTER — Encounter: Payer: Self-pay | Admitting: Cardiovascular Disease

## 2010-09-27 ENCOUNTER — Encounter: Payer: Self-pay | Admitting: *Deleted

## 2010-10-01 ENCOUNTER — Encounter: Payer: Self-pay | Admitting: Cardiovascular Disease

## 2010-10-01 ENCOUNTER — Ambulatory Visit (INDEPENDENT_AMBULATORY_CARE_PROVIDER_SITE_OTHER): Payer: PRIVATE HEALTH INSURANCE | Admitting: Cardiovascular Disease

## 2010-10-01 DIAGNOSIS — I70219 Atherosclerosis of native arteries of extremities with intermittent claudication, unspecified extremity: Secondary | ICD-10-CM

## 2010-10-01 DIAGNOSIS — I739 Peripheral vascular disease, unspecified: Secondary | ICD-10-CM

## 2010-10-01 DIAGNOSIS — I701 Atherosclerosis of renal artery: Secondary | ICD-10-CM

## 2010-10-01 DIAGNOSIS — I2510 Atherosclerotic heart disease of native coronary artery without angina pectoris: Secondary | ICD-10-CM

## 2010-10-01 DIAGNOSIS — I10 Essential (primary) hypertension: Secondary | ICD-10-CM

## 2010-10-01 MED ORDER — CARVEDILOL 6.25 MG PO TABS: 6.2500 mg | ORAL_TABLET | Freq: Two times a day (BID) | ORAL | Status: DC

## 2010-10-01 NOTE — Patient Instructions (Signed)
Your physician has requested that you have a renal artery duplex in July 2012. During this test, an ultrasound is used to evaluate blood flow to the kidneys. Allow one hour for this exam. Do not eat after midnight the day before and avoid carbonated beverages. Take your medications as you usually do.  Your physician has requested that you have a lower extremity arterial duplex in July 2012. This test is an ultrasound of the arteries in the legs. It looks at arterial blood flow in the legs. Allow one hour for Lower Arterial scans. There are no restrictions or special instructions  Your physician wants you to follow-up in: 6 MONTHS. You will receive a reminder letter in the mail two months in advance. If you don't receive a letter, please call our office to schedule the follow-up appointment.  Your physician has recommended you make the following change in your medication: INCREASE Carvedilol to 6.25mg  take one tablet by mouth twice a day

## 2010-10-01 NOTE — Progress Notes (Signed)
HPI:  50 year-old woman with extensive peripheral vascular disease presents for follow-up evaluation today. She has lower extremity PAD, bilateral subclavian and extracrainial carotid stenosis, and renal artery stenosis.  The patient reports stable symptoms. She has mildly lifestyle-limiting claudication with bilateral leg pain at moderate walking distance. She can do normal activities without pain. She has not been exercising. No chest pain or dyspnea. She has been having headaches. No other complaints.  Outpatient Encounter Prescriptions as of 10/01/2010  Medication Sig Dispense Refill  . aspirin 325 MG tablet Take 325 mg by mouth daily.        Marland Kitchen azelastine (ASTEPRO) 137 MCG/SPRAY nasal spray 1 spray by Nasal route 2 (two) times daily. Use in each nostril as directed       . carvedilol (COREG) 3.125 MG tablet Take 3.125 mg by mouth 2 (two) times daily with a meal.        . clopidogrel (PLAVIX) 75 MG tablet Take 75 mg by mouth daily.        . diphenhydramine-acetaminophen (TYLENOL PM) 25-500 MG TABS Take 1 tablet by mouth at bedtime as needed.        . furosemide (LASIX) 20 MG tablet Take 20 mg by mouth every other day.       . potassium chloride SA (K-DUR,KLOR-CON) 20 MEQ tablet Take 20 mEq by mouth every other day.       . simvastatin (ZOCOR) 80 MG tablet TAKE ONE TABLET BY MOUTH EVERY DAY  30 tablet  7  . valsartan (DIOVAN) 80 MG tablet Take 80 mg by mouth daily.          Allergies  Allergen Reactions  . Lisinopril     REACTION: D/C DUE TO COUGH/MC  . Oxycodone     Past Medical History  Diagnosis Date  . HTN (hypertension)   . CAD (coronary artery disease)   . Carotid artery occlusion   . Renal artery stenosis   . PVD (peripheral vascular disease)   . Hypercholesteremia   . Cardiomyopathy   . COPD (chronic obstructive pulmonary disease)   . Pulmonary nodule   . Anemia     ROS: Negative except as per HPI  BP 124/90  Pulse 80  Resp 18  Ht 5\' 5"  (1.651 m)  Wt 212 lb 6.4 oz  (96.344 kg)  BMI 35.35 kg/m2  PHYSICAL EXAM: Pt is alert and oriented, overweight woman in NAD HEENT: normal Neck: JVP - normal, carotids 2+= with bilateral bruits Lungs: CTA bilaterally CV: RRR without murmur or gallop Abd: soft, NT, Positive BS, no hepatomegaly Ext: no C/C/E, femoral pulses 1+=, pedals nonpalpable. Radial pulses diminished bilaterally Skin: warm/dry no rash  EKG: NSR with LBBB, HR 80 bpm.  ASSESSMENT AND PLAN:

## 2010-10-11 NOTE — Assessment & Plan Note (Signed)
Difficult to know where her BP is because of 4 extremity arterial occlusive disease. There is at least a 50 mmHg gradient from the central aorta to either arm based on previous cath data. Recommend increase coreg to 6.25 twice daily and continue valsartan.

## 2010-10-11 NOTE — Assessment & Plan Note (Addendum)
Appears stable. Will continue antiplatelet Rx with ASA and plavix. Followup lower extremity duplex in July.

## 2010-10-22 NOTE — Assessment & Plan Note (Signed)
[facility]                            CARDIOLOGY OFFICE NOTE   [name], [name]                      MRN:          [id number]  DATE:07/28/2007                            DOB:          [birth year]    PRIMARY CARE PHYSICIAN:  Dr. Talmadge Coventry.   INTERVAL HISTORY:  [name] is a delightful 50 year old woman with  severe diffuse vascular disease and nonischemic cardiomyopathy with an  ejection fraction at 35-40%.  She is status post bilateral carotid  endarterectomy as well as stenting of her right renal artery.   The last time we saw her, we got a renal artery ultrasound which showed  a stable left renal artery stenosis greater than 60% and progression of  her right renal artery stenosis which is now also greater than 60%. She  also has underwent lithotripsy for kidney stones due to a recent episode  of pyelonephritis.   She returns today and says overall she is doing pretty well. She denies  any chest pain or shortness of breath.  She does continue to have  claudication. ABIs are 0.73 on the right and 0.91 on the left. She has  not had any ulceration.  She does have a little bit of right flank pain  but no fevers, chills, nausea, vomiting or dysuria.   CURRENT MEDICATIONS:  1. Lasix 20 mg a day.  2. Potassium 20 a day.  3. Simvastatin 80 a day.  4. Aspirin 325 a day.  5. Coreg 12.5 a day.  6. Oxycodone.  7. Claritin p.r.n.  8. Lisinopril 10 a day.   PHYSICAL EXAM:  GENERAL:  She is in no acute distress. Ambulates around  the clinic without any respiratory difficulty.  VITAL SIGNS:  Blood pressure was 82 by Doppler on the left and 70 by  Doppler on the right. Weight is 180.  HEENT:  Normal.  NECK:  Supple. She has bilateral carotid endarterectomy scars. Carotids  are 2+ bilaterally with bilateral bruits. No JVD, no lymphadenopathy or  thyromegaly.  LUNGS:  Clear.  CARDIAC:  PMI is nondisplaced. She is regular with a 2/6  systolic  ejection murmur at the right sternal border.S2 is crisp. There is no  gallop.  ABDOMEN:  Soft, nontender, nondistended. There is no CVA tenderness, no  hepatosplenomegaly, no bruits, no masses.  EXTREMITIES:  Warm with no cyanosis, clubbing or edema, no ulceration or  rash.  NEUROLOGIC:  Alert and oriented x3. Cranial nerves II-XII are intact.  Moves all 4 extremities without difficulty. Affect is pleasant.   ASSESSMENT/PLAN:  1. Congestive heart failure secondary to nonischemic cardiomyopathy      currently NYHA class II and limited most by her claudication.      Unfortunately, her blood pressure limits [place] from titrating any      medications further.  2. Hypotension.  This is asymptomatic. I am wondering if our numbers      are off a bit because her vascular disease.  However, this has not      been the case previously.  We will hold her Lasix for a  couple days      and encourage her to drink fluids.  We will then decrease her Lasix      to every other day. We will get labs today including a CBC, CMET      and a urinalysis and urine culture.  I am not going to stop any of      her cardiac meds at this time unless we have evidence of symptoms      or end-organ problems.  I will get back to her later today.  3. Peripheral vascular disease.  She has evidence of restenosis of her      renal artery stent.  Will make an appointment to see Dr. Excell Seltzer in      the very near future.     Bevelyn Buckles. Bensimhon, MD  Electronically Signed    DRB/MedQ  DD: 07/28/2007  DT: 07/29/2007  Job #: [id number]   cc:   Talmadge Coventry, M.D.

## 2010-10-22 NOTE — Discharge Summary (Signed)
Brenda [name], Brenda [name]               ACCOUNT NO.:  [id number]   MEDICAL RECORD NO.:  [id number]          PATIENT TYPE:  OBV   LOCATION:  3729                         FACILITY:  [facility]   PHYSICIAN:  Veverly Fells. Excell Seltzer, MD  DATE OF BIRTH:  June 01, 1961   DATE OF ADMISSION:  09/29/2007  DATE OF DISCHARGE:  09/30/2007                               DISCHARGE SUMMARY   PROCEDURES:  1. Two-view chest x-ray.  2. Abdominal aortography.  3. Bilateral renal angiography.  4. Left renal artery stent.  5. Right renal artery PTA.   Time of discharge 39 minutes.   PRIMARY AND FINAL DISCHARGE DIAGNOSES:  Peripheral vascular disease.   SECONDARY DIAGNOSES:  1. Diffuse peripheral arterial disease with carotid stenosis and      intermittent claudication.  2. Nonischemic cardiomyopathy with an ejection fraction of 35-40%.  3. Status post bilateral carotid endarterectomies and a previous stent      to the right renal artery.  4. Family history of coronary artery disease.  5. Hypertension.  6. Chronic obstructive pulmonary disease and a pulmonary nodule with a      remote history of tobacco use.  7. Chronic anemia with a hemoglobin 11.5, hematocrit 33, and MCV 92.5      this admission.  8. Dyslipidemia with a total cholesterol 154, triglycerides 109, HDL      37, and LDL 95 in April 2009.   HOSPITAL COURSE:  Brenda [name] is a 50 year old female who was evaluated by  Dr. Excell Seltzer for possible peripheral vascular disease.  He felt that renal  angiography was indicated and she came to the hospital for this on September 29, 2007.   The angiography showed 90% left renal artery stenosis, which was treated  with PTA and 6.0 x 14-mm stent reducing the stenosis to 0.  The right  renal artery had 80% in-stent restenosis, which was treated with PTA,  which reduced stenosis from 80% to less than 20%.   Dr. Excell Seltzer noted that there was a 60 to 70-mm gradient between her left  arm cuff pressure and central aortic  pressure.  Her systemic pressures  were running high and therefore he increased her blood pressure  medications with an increase in both the Coreg and lisinopril dose.  On  September 30, 2007, he evaluated Brenda [name] and felt that she could be safely  discharged home with close outpatient followup.   DISCHARGE INSTRUCTIONS:  1. Her activity level is to be increased gradually.  2. She is to stick to a low-sodium heart-healthy diet.  3. She is to call our office for any problems with the cath site.  4. She is not to lift for 2 weeks and no driving for 3 days.  5. She is to follow up and get a renal ultrasound on Oct 15, 2007, at      11 a.m. and follow up with Dr. Excell Seltzer on Oct 19, 2007, at 12:15.  6. She is to follow up with Dr. Hipps Mince, as needed.   DISCHARGE MEDICATIONS:  1. Aspirin 325 mg daily.  2. Coreg 25 mg b.i.d.  3. Simvastatin 80 mg a day.  4. Lisinopril 20 mg a day.  5. Lasix 20 mg a day.  6. Potassium 20 mEq a day.      Theodore Demark, PA-C      Veverly Fells. Excell Seltzer, MD  Electronically Signed    RB/MEDQ  D:  09/30/2007  T:  10/01/2007  Job:  [id number]   cc:   Talmadge Coventry, M.D.  Bevelyn Buckles. Bensimhon, MD  P. Liliane Bade, M.D.

## 2010-10-22 NOTE — Procedures (Signed)
NAMEISABELA, [name]               ACCOUNT NO.:  [id number]   MEDICAL RECORD NO.:  [id number]          PATIENT TYPE:  AMB   LOCATION:  SDS                          FACILITY:  [facility]   PHYSICIAN:  Veverly Fells. Excell Seltzer, MD  DATE OF BIRTH:  Jan 27, 1961   DATE OF PROCEDURE:  01/02/2010  DATE OF DISCHARGE:                    PERIPHERAL VASCULAR INVASIVE PROCEDURE   PROCEDURE:  Abdominal aortic angiography, bilateral lower extremity  runoff, selective right external iliac angiography, bilateral common  iliac stenting.   INDICATIONS FOR PROCEDURE:  Brenda [name] is a 50 year old woman with  extensive vascular disease.  She has bilateral subclavian artery  occlusion, she has severe extracranial carotid disease and has undergone  carotid bypass.  She also has bilateral renal artery stenosis and has  undergone multiple PTA procedures.  She has severe intermittent  claudication and by her noninvasive studies, she has developed recurrent  in-stent restenosis in her renal arteries as well as severe stenosis of  her right common iliac artery.   The patient was referred for renal angiography and lower extremity  angiography to evaluate the problems listed above.  Risks and  indications of the procedure were reviewed with the patient, informed  consent was obtained.  The left groin was prepped and draped,  anesthetized with 1% Lidocaine.  A 5-French sheath was placed in the  left femoral artery with a little bit of difficulty accessing the  artery.  A Wholey wire was used for access.  A pigtail catheter was  advanced into the abdominal aorta and a suprarenal abdominal aortogram  was performed under digital subtraction.  This demonstrated patent  bilateral renal stents without any areas of focal in-stent re-stenosis,  there was mild to moderate diffuse in-stent re-stenosis but no areas  that warrant intervention.  Pigtail catheter was then brought down to  the distal abdominal aorta and a runoff was  performed.  This  demonstrated severe right common iliac stenosis involving the proximal  and ostial right common iliac artery.  The sheath on the left side was  occlusive and there was mild diffuse disease in the external iliacs and  common femoral arteries.  There were no other areas of severe stenosis  and I elected to intervene on the right common iliac artery.  I thought  the only way to treat this would be with bilateral iliac stenting in  order to protect the left side because of the ostial nature of the right  common iliac disease.   The right common iliac artery was accessed with a 7-French Bright tip  sheath.  The sheath on the left side was also changed out for a 7-French  Bright tip sheath.  The right common iliac was predilated with a 6-mm  [place] balloon which was taken to 10 atmospheres.  There was some  evidence of dissection with angiography following the balloon  dilatation.  Kissing stents were then positioned in the ostial iliac  arteries back to the distal aorta.  A 7 x 38 mm Omnilink stent was used  on the right side, a 7 x 28 mm Omnilink stent was used on the left  side,  both stents were deployed at 12 atmospheres and appeared very well  expanded.  Stents were removed, and pigtail catheter was advanced into  the distal abdominal aorta.  Oblique imaging of the iliacs were  performed and this demonstrated widely patent stents bilaterally.  There  was an excellent angiographic result.  The patient tolerated the  procedure well.  The sheaths were both occlusive and we plan to remove  them as soon as the ACT is less than 200 seconds.   PROCEDURAL FINDINGS:  The right renal artery is patent.  There are  multiple layers of stent present.  There is moderate diffuse in-stent re-  stenosis with a patent covered stent in the right renal artery. The  distal portion of the stent has 50% narrowing but there is no critical  stenosis noted. The distal branch renal arteries  opacify well.   The left renal artery stent is widely patent.  There is really only mild  diffuse restenosis through the left renal artery stent and the remaining  portions of the vessel are patent.  The renal branch vessels are well  visualized and are patent.   The abdominal aorta is diffusely diseased with no critical stenosis, the  distal abdominal aorta just before the bifurcation of the iliac arteries  has mild stenosis.   Right leg:  The right common iliac artery is severely diseased with 80%  to 90% ostial and proximal stenosis.  There is heavy calcification  noted.  There is diffuse plaquing throughout the common and external  iliac arteries.  The common femoral artery is patent.  The internal  iliac is patent.  Deep and superficial femoral arteries are patent.  The  superficial femoral artery has scattered plaquing but no critical  stenosis. There is 3 vessel runoff to the right foot.   Left leg:  The left common iliac artery has mild stenosis.  There is  calcification present.  Internal iliac is patent.  The external iliac  has 50% to 60% narrowing. The common femoral is occlusive from the  sheath. The superficial femoral artery has scattered plaquing but no  severe stenosis.  The popliteal is widely patent.  There is 3 vessel  runoff to the left foot.   FINAL ASSESSMENT:  1. Patent bilateral renal stents with mild to moderate in-stent re-      stenosis.  2. Severe right common iliac artery stenosis with successful kissing      balloon bifurcation stenting of the iliac arteries.      Veverly Fells. Excell Seltzer, MD     MDC/MEDQ  D:  01/02/2010  T:  01/02/2010  Job:  [id number]   Electronically Signed by Tonny Bollman MD on 03/05/2010 11:22:39 PM

## 2010-10-22 NOTE — Assessment & Plan Note (Signed)
OFFICE VISIT   [name], Brenda L  DOB:  [birth year]                                       06/22/2008  CHART#:[id number]   [name] underwent right subclavian carotid bypass and  transposition of right vertebral artery on May 10, 2008.  She  presents today for continued follow-up.  She is now off home oxygen.   Right neck incision is well-healed.  Cranial nerves intact.  She has 2+  right radial pulse.   Overall Brenda [name] is doing well following her surgery.  We will plan  follow-up per protocol with carotid Doppler in 6 months.   Balinda Quails, M.D.  Electronically Signed   PGH/MEDQ  D:  06/22/2008  T:  06/23/2008  Job:  [id number]   cc:   Pramod P. Pearlean Brownie, MD  Veverly Fells. Excell Seltzer, MD  Bevelyn Buckles. Bensimhon, MD

## 2010-10-22 NOTE — Assessment & Plan Note (Signed)
[facility]                          CHRONIC HEART FAILURE NOTE   Brenda [name], Brenda [name]                      MRN:          [id number]  DATE:01/28/2007                            DOB:          1961/05/04    PRIMARY CARDIOLOGIST:  Bevelyn Buckles. Bensimhon, M.D.   PERIPHERAL VASCULAR DOCTOR:  Previously was Dr. Samule Ohm, will now be  Veverly Fells. Excell Seltzer, M.D.   PRIMARY CARE PHYSICIAN:  Talmadge Coventry, M.D.   Brenda [name] is a very pleasant 50 year old Caucasian female who was  referred to the Heart Failure Clinic by Dr. Simona Huh who saw her  recently as a work in patient for Dr. Gala Romney secondary to dyspnea on  exertion in the setting of known nonischemic cardiomyopathy and right  flank discomfort with unclear etiology.  Dr. Diona Browner had arranged for  the patient to have some workup done in regards to the flank pain and  the shortness of breath and results of which are available at this time.  Brenda [name] states she is no longer having the right flank pain but she  has also cut back on the number of sodas that she drinks daily.  She  states she consumes a large amount of [employer] sodas daily and since  cutting back on the soda consumption, the pain in her flank has  resolved.  She is complaining of some claudication type symptoms in her  lower extremities with ambulation.  The good news is that she has  stopped smoking.  She states compliance with her medication.   Brenda [name] is a very pleasant young lady.  She lives here in [place]  with her husband and continues to work full time.  She apparently had an  episode of acute congestive heart failure back in 2007.  Brenda [name] had a  cardiac catheterization at that time that showed nonobstructive disease.  She has a known history of peripheral arterial disease with prior 95%  right renal artery stenosis, a recent renal duplex shows progression of  right renal artery stenosis, status post stent now  greater than 60%,  stable left renal artery stenosis, greater than 60% with recommendations  for a PV consult.  Brenda [name] also has a history of carotid disease  status post carotid endarterectomy by Dr. Madilyn Fireman in February 2008.   PAST MEDICAL HISTORY:  1. Congestive heart failure secondary to nonischemic cardiomyopathy      with an EF previously 25-40% based on different testing modalities.      Most recent echocardiogram done just this month shows an EF of 35-      45%.  2. Peripheral arterial disease with prior 95% right renal artery      stenosis that was intervened upon by Dr. Samule Ohm status post recent      Duplex showing progression of right renal artery stenosis.  3. Carotid artery disease, status post carotid endarterectomy by Dr.      Madilyn Fireman.  4. History of tobacco use, recently discontinued.  5. Recent onset of right flank pain, now resolved.  6. Chronic obstructive pulmonary disease.  7. Hypertension.  8. Right femoral artery pseudoaneurysm, treated with thrombin      injection in the setting of previous peripheral vascular      intervention in 2008.  9. History of pneumothorax at age 11.   REVIEW OF SYSTEMS:  As stated above.   CURRENT MEDICATIONS:  1. Lasix 20 mg.  2. Potassium chloride 20 mEq.  3. Coreg 3.125 b.i.d.  4. Simvastatin 40 mg daily.  5. Aspirin 325 mg daily.   PHYSICAL EXAMINATION:  VITAL SIGNS:  Weight 183, blood pressure  initially 188/120, manually 190/134, after 0.1 mg of clonidine here in  the office.  It dropped to 178/122 and eventually at the time of  discharge 158/100.  Heart rate 68.  GENERAL:  Brenda [name] is in no acute distress.  LUNGS:  Clear to auscultation.  Distant breath sounds bilaterally.  No  JVD at a 45 degree angle.  CARDIOVASCULAR:  Reveals S1 S2.  Regular rate and rhythm.  ABDOMEN:  Soft, nontender.  Positive bowel sounds.  LOWER EXTREMITIES:  Without clubbing, cyanosis, or edema.  NEUROLOGIC:  Alert and oriented  x3.    IMPRESSION:  1. Nonischemic cardiomyopathy without fluid volume overload.  At this      time we will continue medications and have her increase her Coreg      to 6.25 mg twice a day.  2. Hypertensive urgency.  I have given the patient clonidine 0.1 mg      here in the office.  Blood pressure is stable at time of discharge.      I have reviewed with Dr. Gala Romney and Dr. Tonny Bollman the      patient's situation.  I am going to arrange for the patient to      follow up with Dr. Excell Seltzer for a peripheral vascular consult.  I      have gone ahead and ordered ABIs and lower extremity arterial      duplex and then I will see the patient back after she follows up      with Dr. Excell Seltzer.      Dorian Pod, ACNP  Electronically Signed      Bevelyn Buckles. Bensimhon, MD  Electronically Signed   MB/MedQ  DD: 01/28/2007  DT: 01/29/2007  Job #: [id number]   cc:   Talmadge Coventry, M.D.

## 2010-10-22 NOTE — Op Note (Signed)
Brenda [name], DERKS               ACCOUNT NO.:  [id number]   MEDICAL RECORD NO.:  [id number]          PATIENT TYPE:  INP   LOCATION:  3729                         FACILITY:  [facility]   PHYSICIAN:  Veverly Fells. Excell Seltzer, MD  DATE OF BIRTH:  06-Jul-1960   DATE OF PROCEDURE:  09/29/2007  DATE OF DISCHARGE:                               OPERATIVE REPORT   PROCEDURE:  1. Abdominal aortography.  2. Bilateral renal angiography, left renal artery stenting.  3. Right renal artery PTA.   PREPROCEDURAL INDICATIONS:  Ms. Flesch is a 50 year old woman with  diffuse vascular disease.  She has had prior right renal artery stenting  just over 1 year ago.  She has been followed up with renal duplex  ultrasound and has developed severe in-stent restenosis in the right  renal artery and severe de novo stenosis in the left renal artery by her  noninvasive studies.  We are unable to accurately check her blood  pressure because of upper extremity arterial disease.  Her kidney size  has begun to decrease.  Her creatinine has remained stable.  She was in  the setting of severe bilateral renal stenosis and progressive decline  in kidney size.  She was brought in for angiography and planned PTA.   Risks and indications of the procedure were reviewed with this patient.  Informed consent was obtained.  The left groin was prepped, draped, and  anesthetized with 1% lidocaine using modified Seldinger technique.  A 6-  French sheath was placed into the left femoral artery.  A 5-French  pigtail catheter was advanced into the suprarenal aorta and a flush  aortogram was performed using digital subtraction.  This demonstrated  severe bilateral renal artery stenosis.  A 5-French IM catheter was  inserted to confirm this and selective bilateral renal angiography was  performed.  There was a marked pressure gradient across both renal  ostia.  Both renal ostia had a 80 to 100-mm gradient on catheter  pullback.  At that point,  the sheath was upsized to a 7-French in  anticipation of using a cutting balloon on the right renal artery  restenosis.  A 7-French IM guide was inserted carefully into the left  renal origin.  A stabilizer guidewire was advanced beyond the area of  stenosis.  Heparin had been used for anticoagulation, 4000 units were  given and ACT was greater than 200.  I elected to directly stent the  left renal artery with a 6.0 x 14-mm Express stent.  This was deployed  at 12 atmospheres.  There is excellent stent expansion with a mild  residual waste in the midportion.  There was good flow in the vessel.  I  postdilated the Express stent with a 7 x 15 mm Sterling balloon, which  was taken to 12 atmospheres.  The final angiography demonstrated a  widely patent stent with no residual stenosis.   At that point, attention was turned to the right renal artery.  There  was an inferior origin to the vessel, which made cannulation somewhat  difficult.  I was able to pass a wire  through the stent without too much  difficulty into the distal renal artery.  I attempted to pass a 5-mm  cutting balloon, but I could not get it passed the proximal edge of the  stent or across the ostium of the vessel.  The cutting balloon was  nowhere near be able to cross the stent.  At that point, I changed out  for a 5 x 15 Sterling balloon and performed multiple balloon inflations  initially up to 8 atmospheres and ultimately up to 12 atmospheres.  This  improved the appearance of the stent and had opened well.  I then took a  5 x 12-mm Quantum maverick balloon, so that I could go up to high  atmosphere pressure with that.  That balloon was carefully positioned  and inflated to 20 atmospheres, which improved on stent expansion.  There was approximate 20% residual stenosis at the completion of the  procedure.  The patient tolerated procedure well with a good  angiographic result.  The guide catheter was removed and the  7-French  sheath will be removed with manual pressure for hemostasis.   ASSESSMENT:  Successful left renal artery stenting and right renal  artery angioplasty.   PLAN:  Ms. Rebuck should continue on aspirin and Plavix.  She will be  observed overnight and a followup renal ultrasound will be scheduled in  2 weeks.      Veverly Fells. Excell Seltzer, MD  Electronically Signed     MDC/MEDQ  D:  09/29/2007  T:  09/30/2007  Job:  [id number]   cc:   Bevelyn Buckles. Bensimhon, MD  P. Liliane Bade, M.D.

## 2010-10-22 NOTE — Progress Notes (Signed)
[facility]                        PERIPHERAL VASCULAR OFFICE NOTE   LESLIE, JESTER                      MRN:          [id number]  DATE:10/19/2007                            DOB:          December 27, 1960    HISTORY:  Karessa Onorato was seen in followup at the [facility]  [facility]ascular office on Oct 19, 2007.  Ms. Wagoner is a 50 year old woman with  diffuse peripheral arterial disease.  She has had surveillance of her  renal arteries following initial right renal artery stenting in February  2008.  She developed severe in-stent restenosis of the right renal  artery by ultrasound with a peak systolic velocity over 5 meters and  also has developed severe left renal artery stenosis with a peak  velocity of nearly 5 meters per second.  There was also a small decrease  in kidney size, but no significant change and both kidneys remain normal  in size.  With those changes, I elected to repeat renal angiography.  This was done on September 29, 2007 and severe bilateral stenosis was  confirmed.  The right renal artery restenosis was treated with balloon  angioplasty.  The left renal artery had a 90% stenosis that was treated  with a 6 mm to zero residual.  The patient tolerated the procedure well  and had no postprocedure problems.  She was discharged home the  following day.  She has no new complaints since her hospitalization.  Of  note, simultaneous pressures were recorded from the left arm in the  central aorta at the time of her procedure and there is a 60-70 mm  gradient present.  She has bilateral upper extremity arterial disease  which complicates management of her hypertension.   She does complain of cough that has been present for several months and  she associates this with her ACE inhibitor.  In reviewing her records,  it appears her lisinopril was started in November 2008.  Otherwise, she  denies chest pain, dyspnea, orthopnea, PND or edema.  Her  leg  claudication symptoms are stable.   MEDICATIONS:  1. Lasix 20 mg every other day.  2. Potassium chloride 20 mEq every other day.  3. Coreg 25 mg twice daily.  4. Lisinopril 20 mg daily.  5. Advair 100/50 mcg daily.  6. Spiriva daily.  7. Aspirin 325 mg daily.  8. Simvastatin 80 mg daily   ALLERGIES:  NKDA.   PHYSICAL EXAMINATION:  GENERAL:  She is alert and oriented in no acute  distress.  VITAL SIGNS:  Weight 193, blood pressure in the left arm is 96/64,  unable to obtain on the right, heart rate 68, respiratory rate 16.  HEENT:  Normal.  NECK:  Normal carotid upstrokes with right greater than left carotid  bruit.  Jugular venous pressure is normal.  LUNGS:  Clear bilaterally.  HEART:  Regular rate and rhythm without murmurs or gallops.  ABDOMEN:  Soft, nontender, no organomegaly.  No abdominal bruits.  EXTREMITIES:  No clubbing, cyanosis or edema.  SKIN:  Warm and dry   DIAGNOSTICS:  Renal duplex was completed Oct 15, 2007 that showed normal  bilateral kidney size with improved flow through both kidneys.  Both  renal arteries were widely patent with brisk flow following  intervention.  The renal to aortic ratio was 1.8 on the right and 1.8 on  the left.  Peak velocities were 223 cm/s on the right and 213 cm/s on  the left.   ASSESSMENT:  Ms. Kabel has a good initial resolve after bilateral renal  artery intervention.  She has developed a cough that is probably related  to her ACE inhibitor.  It has now lasted for several months and  corresponds with duration of lisinopril.  We will change her over to  Diovan 80 mg daily and see if her cough resolves with that.  She really  should have an ACE or ARB on board in the setting of her nonischemic  myopathy.   Regarding her renal stenosis, we will check a follow up renal duplex in  6 months and see her back in the office at that time.  I see that she  has completed her Plavix and is not  currently on this medication.   She  is working on getting medication assistance and I think if she is able  to do this, she would benefit from long-term aspirin and Plavix.  She  has had so much trouble with her vascular disease and has required  multiple interventions.  If she is not able to obtain Plavix, then it  would be reasonable to just continue with 325 mg of aspirin.     Veverly Fells. Excell Seltzer, MD  Electronically Signed    MDC/MedQ  DD: 10/19/2007  DT: 10/19/2007  Job #: [id number]   cc:   Bevelyn Buckles. Bensimhon, MD  Talmadge Coventry, M.D.

## 2010-10-22 NOTE — H&P (Signed)
Brenda [name], Brenda [name]               ACCOUNT NO.:  [id number]   MEDICAL RECORD NO.:  [id number]          PATIENT TYPE:  INP   LOCATION:  3734                         FACILITY:  [facility]   PHYSICIAN:  Rollene Rotunda, MD, FACCDATE OF BIRTH:  06-22-1960   DATE OF ADMISSION:  04/01/2007  DATE OF DISCHARGE:                              HISTORY & PHYSICAL   PRIMARY CARDIOLOGIST:  Dr. Nicholes Mango.   PERIPHERAL VASCULAR:  Dr. Calton Dach.  Brenda [name] is also outpatient in  the heart failure clinic.   PRIMARY CARE:  Annmarie Mazzocchi.   Brenda [name] is a very pleasant 50 year old Caucasian female who presents  to [facility] today complaining of multiple problems including  1. New onset shortness of breath.  2. Chest discomfort.  3. Left flank pain.  4. Nausea and vomiting with dry heaves and fever and chills.  We were asked by ER physician to see Brenda [name] as she has a known  history of nonischemic cardiomyopathy and nonobstructive coronary artery  disease along with severe peripheral vascular disease.  She complained  of chest discomfort that is rather atypical, however.  Also, the  discomfort was Tuesday.  She states she had a headache.  She just felt  poor all day, felt weak.  Wednesday morning she got up experiencing  lower back pain and vomited, noted she had some fever and chills and  mildly short of breath.  This morning she got up, experienced the chest  discomfort, vomiting with dry heaves.  She had some pain in her left  axillary area, left shoulder area radiating down to her elbow and some  substernal discomfort she described as indigestion that felt better  after she vomited.  She went Urgent Care this morning to be evaluated  secondary to acute pain under her left rib cage.  EKG was obtained there  that showed sinus rhythm, sinus tach with a left bundle branch block.  Staff was unsure if it was a new block as they had no old EKG for  comparison.  The patient was sent to [facility] emergency room for  further evaluation.  Brenda [name] as stated above has a history of  nonischemic cardiomyopathy.  She had a CHF exacerbation in 2007 at which  time she underwent a cardiac catheterization that showed nonobstructive  disease with mild AS and nonischemic cardiomyopathy with EF of 35-40%.  Brenda [name] is also known to have significant peripheral arterial disease  with bilateral renal artery stenosis with the right being greater than  the left.  She is status post stenting to the right renal artery.  She  has moderate iliac disease.  She has carotid disease.  She has underwent  a right carotid enterectomy and is pending a left carotid enterectomy  this coming Tuesday by Dr. Madilyn Fireman.  Brenda [name] was last seen in the heart  failure clinic in August.  She states she has been doing well since  then, has actually felt pretty good up until this week.  She walks 30  minutes on her treadmill every day at home.  She is  not currently  working because she was laid off.  She does some light housework,  otherwise not very exertional.  She denies any orthopnea or PND.  No  peripheral edema.  She has complained of decreased appetite and  abdominal fullness and mild dyspnea on exertion along with the left  flank pain.  She denies any dysuria or hematuria.  She does not weigh  every day.  She states her weight has gone from 160 pounds up to 180  pounds over the last year since she stopped smoking.   ALLERGIES:  NO KNOWN DRUG ALLERGIES.   MEDICATIONS:  1. Carvedilol 12.5 mg b.i.d.  2. Tramadol p.r.n.  3. Aspirin 325  4. Simvastatin 40  5. Benadryl p.r.n.  6. Lasix 20 daily  7. KCl 20 daily.   PAST MEDICAL HISTORY:  1. Includes nonischemic cardiomyopathy status post CHF exacerbation in      2007, status post cardiac catheterization at that time showed      nonobstructive disease, mild AS with EF of 35-40%, status post      echocardiogram in September of this year showing EF 35-40%.  2.  Peripheral arterial disease      a.     Status post right renal artery stenosis stent      b.     Stable left renal artery stenosis      c.     Moderate iliac disease      d.     Pending left carotid endarterectomy  by Dr. Madilyn Fireman.  3. Kidney stones greater than 5 years ago.  4. Hypertension, most likely cause of the patient's nonischemic      cardiomyopathy.  5. COPD with remote tobacco use.  The patient quit approximately 1      year ago.   SOCIAL HISTORY:  She lives in [place] with her husband.  She is  currently laid off.  She has adult children.  Quit using tobacco in  2007, exercises on her treadmill for 30 minutes daily.  Denies any EtOH  or herbal medication use.  She tries to follow a heart healthy diet but  is not very consistent.   FAMILY HISTORY:  Mother deceased secondary to complications of bypass.  She her first MI at age 92.  Father deceased in his 32s with a history  of coronary artery disease status post MI.  No siblings with known  coronary artery disease.   REVIEW OF SYSTEMS:  Positive for fevers, chills, headaches, chest pain,  shortness of breath, dyspnea on exertion, generalized weakness, left  flank pain, left back pain, left axillary pain, nausea, vomiting dry  heaves, mild abdominal discomfort.   PHYSICAL EXAM:  Temperature bladder 100.8, pulse 94, respirations 22-30,  blood pressure fluctuating between 90/56 and 135/76, satting 99% on  Venturi mask.  GENERALLY Brenda [name] is in no acute distress at the time of our  evaluation.  HEENT:  She is normocephalic, atraumatic.  Pupils equal, round, reactive  to light.  Sclerae is clear.  NECK: Supple without any signs of JVD.  She has a left carotid bruit.  Well healed scar to the right neck.  CARDIOVASCULAR:  Exam reveals S1-S2, a 2/6 systolic ejection murmur,  questionable radiating up into the right neck area.  She has bilateral  femoral bruits.  LUNGS: She has distant breath sounds bilateral bases.  I  otherwise did  not hear any wheezing, rhonchi or rales.  SKIN is warm and dry.  ABDOMEN:  Soft,  nontender, positive bowel sounds.  LOWER EXTREMITIES: Without clubbing, cyanosis or edema.  She has 2+ DPs  on the right and 1+ DPs on the left.  NEUROLOGICALLY:  She is alert and oriented x3.   LABORATORY DATA:  Chest X-Ray: Showing increased mediastinum appears  widen compared to previous PA and lateral film, questionable right lower  lobe pneumonia, recommend CT.  EKG sinus rhythm with left bundle branch  block with no change from previous EKG, at a rate of 91.  Lab work, H&H  11.8 and 35, WBC 16.2, platelets 248,000.  Sodium 136, potassium 3.4,  chloride 105, BUN 12, creatinine 1.1 with a glucose of 110, AST 13, ALT  15, troponin less than 0.05.  BNP stable at 236.  CT angiogram of the  abdomen and chest:  1. No pulmonary emboli or aortic dissection, stable changes of COPD.      Interval 5 mm calculus in the proximal pelvic portion of the left      ureter, causing mild to moderate left hydronephrosis and      hydroureter.  Interval abdominal perfusion of the upper and      midportions of the left kidney with appearance most compatible with      pyelonephritis with lobar nephronia.  A neoplastic process is less      likely since this appeared normal 07/14/06.  Diffuse fatty filtration      of the liver.   Dr. Rollene Rotunda in to examine and assess patient with complaints of  flank discomfort associated with nausea, vomiting, dry heaves and some  new onset shortness of breath.  Doubt active cardiac issue at this time,  most likely musculoskeletal with the patient having dry heaves with her  chest discomfort.  The patient will be admitted, rule out cardiac  process/slash MI, admitted for nephrolithiasis, ask urology to consult  for antibiotic therapy.  Continue home medications. p.r.n. orders and  possibly discharge  home within the next 24 or 48 hours.      Dorian Pod,  ACNP      Rollene Rotunda, MD, [facility]  Electronically Signed    MB/MEDQ  D:  04/01/2007  T:  04/02/2007  Job:  [id number]   cc:   Talmadge Coventry, M.D.

## 2010-10-22 NOTE — Discharge Summary (Signed)
Brenda [name], Brenda [name]               ACCOUNT NO.:  [id number]   MEDICAL RECORD NO.:  [id number]          PATIENT TYPE:  INP   LOCATION:  3303                         FACILITY:  [facility]   PHYSICIAN:  Balinda Quails, M.D.    DATE OF BIRTH:  [birth year]   DATE OF ADMISSION:  05/24/2007  DATE OF DISCHARGE:  05/25/2007                               DISCHARGE SUMMARY   DISCHARGE DIAGNOSIS:  Severe progressive asymptomatic left internal  carotid artery stenosis.   DISCHARGE/SECONDARY DIAGNOSES:  1. Severe progressive asymptomatic left internal carotid artery      stenosis status post left carotid endarterectomy.  2. Postoperative hypotension, improved.  3. Nonischemic cardiomyopathy with EF 40%.  4. Peripheral vascular disease.  5. Nonobstructive coronary artery disease.  6. Hypertension.  7. History of chronic obstructive pulmonary disease.  8. Chronic anemia.  9. History of right carotid endarterectomy in February 2008 with      transit dysfunction of right marginal mandibular nerve now      resolved.  10.Renal artery stenosis status post renal stent right kidney.  11.Fatty infiltration of the liver per CT scan October 2008.  12.History of left urethral stone and hydronephrosis.  13.History of tobacco abuse.  Quit in December 2007.   ALLERGIES:  No known drug allergies.   PROCEDURES:  Left carotid endarterectomy with Dacron patch angioplasty  done on May 24, 2007 by Dr. Denman George (left carotid  endarterectomy with primary closure of the left common carotid artery  and Dacron patch angioplasty of the left internal carotid artery).   BRIEF HISTORY:  Ms. Begnaud is a 50 year old Caucasian female with history  of ECCVOD status post right carotid endarterectomy in February 2008 for  severe right internal carotid artery stenosis.  She also had known  disease of the left internal carotid artery, and has been followed  regularly at the [facility] office with duplex surveillance carotid  studies.  Most recent study revealed progression of the velocities of the left  internal carotid artery 571/231 cm/second.  She has been asymptomatic of  her carotid disease.  Due to their progression of her carotid disease on  the left Dr. Madilyn Fireman recommended that she undergo elective left carotid  endarterectomy to reduce her risk for future stroke.   HOSPITAL COURSE:  Ms. Garguilo was electively admitted to [facility] on May 24, 2007.  She underwent the previously mentioned  procedure.  Postoperatively she was extubated and neurologically intact.  After short stay in recovery unit was transferred to step-down unit 3300  where she remained until discharge.  She had a relatively uneventful  postoperative course, but did require short term dopamine drip for  hypotension.  She was weaned from dopamine around 3 a.m. on  postoperative day #1.  Of note a Jackson-Pratt drain was placed  intraoperatively, but had minimal output and was discontinued on  postoperative day #1.  She remained neurologically intact during her  hospitalization.  On December 16 vitals showed she was afebrile, blood  pressure 114/70, oxygen saturation 96% on room air, heart rate 70-80 in  sinus rhythm.  She was able to ambulate and void without difficulty.  Morning labs show white count 11.5, hemoglobin 10.6, hematocrit 31.5,  platelet count 300.  Sodium 138, potassium 4.4, BUN 11, creatinine 1.08,  and glucose 132 (of note her admission creatinine was elevated at 1.63,  although she has known history of renal insufficiency.  Nonfasting blood  glucose as well preoperatively was 103.)  At time of this dictation Ms.  Mohamed denies dysphagia.  Pain is controlled.   PHYSICAL EXAMINATION:  HEART:  Regular rate and rhythm.  LUNGS:  Clear.  ABDOMEN:  Benign with decreased bowel sounds.  NEUROLOGICAL:  She remained intact.  She was moving all extremities x4  and symmetrically tongue is midline.  Incision shows no  evidence of hematoma and is clean and dry.   Subsequently, it is felt Ms. Greff will be ready for discharge home  later in the morning or early afternoon on postoperative day #1,  May 25, 2007.  Currently she remains in stable condition.   DISCHARGE MEDICATIONS:  1. Oxycodone 5 mg 1-2 tablets p.o. q.4 h. p.r.n. pain.  2. Lisinopril 10 mg daily.  3. Klor-Con 20 mEq daily.  4. Furosemide 20 mg daily.  5. Simvastatin 80 mg q.h.s.  6. Coreg 12.5 mg daily.  7. Coated aspirin 325 mg daily.  8. Tylenol per pack instructions as needed.  9. Benadryl 25 mg per package instructions p.r.n. as needed per her      home regimen.   DISCHARGE INSTRUCTIONS:  She is to continue heart healthy diet and  increase her activity slowly, avoid driving and heavy lifting for the  next two weeks.  She may shower and clean her incisions gently in soap  and water.  Call if she develops fever greater than 101, redness or  drainage from her incision site or neurological changes.  She will see  Dr. Madilyn Fireman at the [facility] office in approximately two weeks, and our office  will contact her regarding specific appointment date and time.      Jerold Coombe, P.A.      Balinda Quails, M.D.  Electronically Signed    AWZ/MEDQ  D:  05/25/2007  T:  05/25/2007  Job:  [id number]   cc:   Bevelyn Buckles. Bensimhon, MD  Talmadge Coventry, M.D.

## 2010-10-22 NOTE — Assessment & Plan Note (Signed)
OFFICE VISIT   Brenda [name], Brenda [name]  DOB:  07-15-1960                                       03/04/2007  CHART#:[id number]   The patient is status post left carotid endarterectomy carried out  07/31/2006 at [facility].  Following this, she had some  transient dysfunction of her right marginal mandibular nerve.  This is  now totally resolved.  She returned to the office at this time for  duplex surveillance with known left internal carotid artery stenosis.  This has now progressed with marked elevation of velocities, 571/231 cm  per second.  She has been free of any symptoms.   She has had a renal stent placed and apparently does have some  restenosis.  She also has peripheral vascular disease and is to see Dr.  Excell Seltzer regarding this.   The patient is a 50 year old female who appears older than her stated  age.  BP 130/89, pulse 79 per minute.  Left carotid bruit audible.  Right neck incision well healed.  Cranial nerves intact.  Strength equal  bilaterally.   The patient will require left carotid endarterectomy for reduction of  stroke risk.  She is readily agreeable to this.  She is to continue her  aspirin at 325 mg daily.  I will plan to go ahead and  perform a left carotid endarterectomy once she reviews her schedule.  She is going to contact Oswaldo Done at that time.   Balinda Quails, M.D.  Electronically Signed   PGH/MEDQ  D:  03/04/2007  T:  03/05/2007  Job:  [id number]   cc:   Bevelyn Buckles. Bensimhon, MD  Talmadge Coventry, M.D.  Veverly Fells. Excell Seltzer, MD

## 2010-10-22 NOTE — Assessment & Plan Note (Signed)
[facility]                            CARDIOLOGY OFFICE NOTE   Brenda [name], Brenda [name]                      MRN:          [id number]  DATE:01/05/2007                            DOB:          31-May-1961    PRIMARY CARDIOLOGIST:  Bevelyn Buckles. Bensimhon, MD.   REASON FOR VISIT:  Shortness of breath and right flank discomfort.   Brenda [name] is a 50 year old woman scheduled to be seen today in clinic by  the physician of the day.  Brenda [name] has been followed in the past by Dr.  Gala Romney and also Dr. Samule Ohm with a history of nonischemic  cardiomyopathy (ejection fraction ranging from 25% to 40% based on  different testing modalities), peripheral arterial disease with prior  95% right renal artery stenosis that was intervened upon by Dr. Samule Ohm,  carotid artery disease status post carotid endarterectomy by Dr. Liliane Bade in February of this year and nonobstructive coronary  atherosclerosis documented by cardiac catheterization in December 2007,  at which time her cardiomyopathy was diagnosed.  Brenda [name] reports that  Brenda [name] has been doing fairly well until last week.  At that time, Brenda [name] began  to experience a dull discomfort in her right flank pointing to her lower  right costal area.  This pain is not pleuritic.  It seems to be worse at  night time.  Brenda [name] has not experienced any frank dysuria or fevers and  states that it is present most of the time.  Brenda [name] reports that this feels  somewhat similar to previous kidney stones but not as intense.  Brenda [name]  does not report any change in her urine volume.  Additional symptoms  include dyspnea on exertion which has been more prominent over the last  few weeks.  Brenda [name] has had no frank orthopnea or lower extremity edema.  I  note that her weight is up overall, but Brenda [name] is not complaining of any  obvious fluid retention.  Brenda [name] has had no chest pain.  Today's  electrocardiogram shows sinus rhythm with a left bundle branch block  pattern.  I  reviewed her medications which are outlined below.  Brenda [name] has  not had any interval follow-up testing of either her renal arteries  since February or her ejection fraction since December.   ALLERGIES:  NO KNOWN DRUG ALLERGIES.   PRESENT MEDICATIONS:  1. Aspirin 325 mg p.o. daily.  2. Simvastatin 40 mg p.o. daily.  3. Coreg 3.125 mg p.o. b.i.d.  4. Potassium 20 mEq p.o. daily.  5. Lasix 20 mg p.o. daily.   REVIEW OF SYSTEMS:  As described in history of present illness.  Brenda [name] has  had no palpitations or syncope.   PHYSICAL EXAMINATION:  VITAL SIGNS:  Blood pressure 112/86, heart rate  87, weight 176 pounds up from 165 in February.  GENERAL APPEARANCE:  This is a normally nourished woman in no acute  distress.  NECK:  No elevated jugular venous pressure.  There are bilateral carotid  bruits noted.  No thyromegaly.  LUNGS:  Essentially clear with diminished breath sounds.  No obvious  pleural rub noted.  No wheezing.  CARDIOVASCULAR:  Regular rate and rhythm with a 2/6 systolic murmur  heard at the base.  Second heart sound is preserved.  No S3 gallop.  ABDOMEN:  Soft, no obvious bruits.  No guarding or focal tenderness.  EXTREMITIES:  No pitting edema.  Skin is warm and dry.  Distal pulses  are 2+.  MUSCULOSKELETAL:  No kyphosis is noted.  NEUROPSYCHIATRIC:  The patient is alert and oriented x3.   IMPRESSION/RECOMMENDATIONS:  1. Dyspnea on exertion in the setting of known nonischemic      cardiomyopathy.  I plan a follow-up 2-D echocardiogram, BMET and      BNP with subsequent evaluation in the Heart Failure clinic.  At      this point, no specific medication adjustments were made pending      further studies.  Brenda [name] reports compliance with her medications.  2. Right flank discomfort, etiology not certain.  I plan a chest x-ray      with subsequent urinalysis and renal artery Duplex scanning.  These      studies can be reviewed at her return visit as well.  I anticipate      that Brenda [name]  may need follow-up with Dr. Excell Seltzer from a peripheral      vascular perspective in Dr. Melinda Crutch absence.  3. Further plans to follow.     Jonelle Sidle, MD  Electronically Signed    SGM/MedQ  DD: 01/05/2007  DT: 01/06/2007  Job #: [id number]   cc:   Bevelyn Buckles. Bensimhon, MD  Talmadge Coventry, M.D.  Balinda Quails, M.D.

## 2010-10-22 NOTE — H&P (Signed)
[name], [name]               ACCOUNT NO.:  [id number]   MEDICAL RECORD NO.:  [id number]          PATIENT TYPE:  INP   LOCATION:  3303                         FACILITY:  [facility]   PHYSICIAN:  Balinda Quails, M.D.    DATE OF BIRTH:  02/09/1961   DATE OF ADMISSION:  05/24/2007  DATE OF DISCHARGE:                              HISTORY & PHYSICAL   ATTENDING PHYSICIAN:  Balinda Quails, MD.   CARDIOLOGIST:  Bevelyn Buckles. Bensimhon, MD   PRIMARY CARE PHYSICIAN:  Talmadge Coventry, M.D.   ADMISSION DIAGNOSIS:  Severe progressive asymptomatic left internal  carotid artery stenosis   HISTORY:  Brenda [name] is a 50 year old female who underwent right  carotid endarterectomy in February 2008.  This was carried out for  severe right internal carotid artery stenosis.  She has known disease of  the left internal carotid artery and is followed regularly at the office  with duplex surveillance carotid studies.  The most recent study has now  revealed progression of the velocities in the left internal carotid  artery, 571/231 cm/sec.  She has been free of any symptoms.  No sensory,  motor, or visual deficit.   PAST MEDICAL HISTORY:  1. Nonischemic cardiomyopathy with EF of 40%.  2. Peripheral vascular disease.  3. Nonobstructive coronary artery disease.  4. Hypertension.  5. Pulmonary nodule.  6. COPD.  7. Chronic anemia.   MEDICATIONS:  1. Lisinopril 10 mg daily.  2. Klor-Con 20 mEq daily.  3. Furosemide 20 mg daily.  4. Simvastatin 80 mg nightly.  5. Carvedilol 12.5 mg daily.  6. Aspirin 81 mg daily.  7. Tylenol p.r.n.  8. Benadryl 25 mg 1-2 tablets p.r.n.   ALLERGIES:  None known.   FAMILY HISTORY:  Mother died at age 76 of complications of coronary  bypass.  Father died at age 77 of malignancy.  She has five siblings who  are generally well.  Her two children are active and well.   SOCIAL HISTORY:  The patient lives in [place].  Has two children.  She works Teacher, adult education. Smoked two packs a day for several  years, quit in December 2007.  Denies significant alcohol  No drug use.   REVIEW OF SYSTEMS:  Denies any recent chest pain or shortness of breath.  No cough or sputum production.  Weight is stable.  Bowel habits regular.  No dysuria or frequency.  No change in vision or hearing.   PHYSICAL EXAMINATION:  GENERAL:  A 50 year old female who appears older  than her stated age.  Alert and oriented.  No acute distress.  VITAL SIGNS:  Blood pressure is 130/80, pulse 79 per minute.  HEENT:  Poor dentition.  Normocephalic.  NECK:  Supple.  No thyromegaly or adenopathy.  CARDIOVASCULAR:  Left carotid bruit.  Normal heart sounds without  murmurs.  No extra sounds or rubs.  CHEST:  Clear to auscultation.  Normal respiratory effort.  ABDOMEN:  Soft, nontender.  No masses or organomegaly.  Bowel sounds  present.  No abdominal bruits.  EXTREMITIES:  Warm without cyanosis.  No ankle  edema.  NEUROLOGIC:  The patient is alert and oriented.  Moves all extremities  to command.  Cranial nerves intact.  Normal sensation.   IMPRESSION:  1. Asymptomatic severe progressive left internal carotid artery      stenosis.  2. Nonischemic cardiomyopathy.  3. Nonobstructive coronary artery disease.  4. Hypertension.  5. Chronic obstructive pulmonary disease.   PLAN:  The patient will be admitted to [facility] electively  for left carotid endarterectomy for reduction of stroke risk.  Risks and  benefits of the operative procedure were reviewed with the patient. The  indications for surgery is reduction of stroke risk.  Potential  complications include MI, CVA, cranial nerve injury, and death with a  rate of 1-2%.      Balinda Quails, M.D.  Electronically Signed     PGH/MEDQ  D:  05/24/2007  T:  05/24/2007  Job:  [id number]   cc:   Bevelyn Buckles. Bensimhon, MD  Talmadge Coventry, M.D.

## 2010-10-22 NOTE — Discharge Summary (Signed)
NAMEJAELEE, LAUGHTER               ACCOUNT NO.:  [id number]   MEDICAL RECORD NO.:  [id number]          PATIENT TYPE:  INP   LOCATION:  3734                         FACILITY:  [facility]   PHYSICIAN:  Jesse Sans. Wall, MD, FACCDATE OF BIRTH:  Mar 04, 1961   DATE OF ADMISSION:  04/01/2007  DATE OF DISCHARGE:  04/04/2007                               DISCHARGE SUMMARY   PROCEDURES:  CT of the abdomen and chest.   PRIMARY FINAL DISCHARGE DIAGNOSIS:  Pyelonephritis.   SECONDARY DIAGNOSES:  1. History of cerebrovascular disease and peripheral vascular disease      with right renal stent, stable left renal artery stenosis, moderate      iliac disease and history of left carotid stenosis.  2. History of nephrolithiasis.  3. Hypertension.  4. Nonischemic cardiomyopathy with an ejection fraction of 40%,      nonobstructive disease by catheterization.  5. Chronic obstructive pulmonary disease.  6. Remote history of tobacco use.  7. Chronic systolic congestive heart failure.  8. Family history of coronary artery disease in both parents.   Time at discharge 43 minutes.   HOSPITAL COURSE:  Ms. Custodio is a 50 year old female with a history of  heart failure and vascular disease.  She came to the hospital on the day  of admission for shortness of breath, left flank pain, and nausea and  vomiting.  She was admitted for further evaluation and treatment.   A urology consult was called because there was concern for  pyelonephritis.  Dr. Brunilda Payor did feel that she had pyelonephritis and  started on Ancef and gentamicin.  He ordered a urine culture and  sensitivity, but this was never performed.  After approximately 48 hours  on antibiotics, her symptoms had significantly improved.  Dr. Logan Bores saw  Ms. Zavaleta and cleared her to be switched to oral antibiotics.  After  discussion with pharmacy, it was felt that Septra was a good choice.  She is to continue on this as an outpatient, and follow with Dr. Brunilda Payor.   Once she was treated with antibiotics and her infection improved, her  nausea and vomiting also improved.  Her white count initially was  16,000, and this improved.  Her cardiac status remained stable.  She had  cardiac enzymes cycled, but these were negative except for a minimal  elevation in the troponin to 0.08,  which was not considered significant  in the setting of an acute illness.   By April 04, 2007, she had a couple of loose stools after a stool  softener was given, but this was not having ongoing diarrhea.  She was  afebrile.  She was ambulating without chest pain or shortness of breath,  and her O2 saturation was 94% on room air.  Her cardiac status was  considered stable and her neurologic status was improved.  She was  evaluated by Dr. Daleen Squibb considered stable for discharge, and is to follow  up in the office as an outpatient.   DISCHARGE INSTRUCTIONS:  Her activity level is to be increased  gradually.  She is to weigh herself daily.  She  is to follow up with Dr.  Gala Romney and Dr. Excell Seltzer, and a message has been left.  She is to follow  up with Dr. Brunilda Payor, and call him for an appointment.  She is to follow up  with Dr. Antigua Mince as needed.   DISCHARGE MEDICATIONS:  1. Simvastatin 40 mg a day.  2. Aspirin 325 mg daily.  3. Coreg 12.5 mg b.i.d.  4. Tramadol p.r.n.  5. Lasix 20 mg a day.  6. Potassium 20 mEq a day.  7. Flomax 0.4 mg nightly.  8. Septra DS 160/800 one tablet b.i.d. for 10 days.      Theodore Demark, PA-C      Jesse Sans. Daleen Squibb, MD, [facility]  Electronically Signed    RB/MEDQ  D:  04/04/2007  T:  04/05/2007  Job:  [id number]   cc:   Talmadge Coventry, M.D.  Lindaann Slough, M.D.

## 2010-10-22 NOTE — Assessment & Plan Note (Signed)
OFFICE VISIT   MERCEDE, ROLLO  DOB:  08-Apr-1961                                       02/03/2008  CHART#:[id number]   The patient has undergone bilateral carotid endarterectomies, right  carotid endarterectomy in February of 2008, left carotid endarterectomy  carried out December of 2008.   Most recent duplex scan carried out in June reveals a severe right  common carotid artery stenosis with velocities 612/187 cm/sec.   She has been seen in consultation by Dr. Pearlean Brownie who recommended either  redo right carotid endarterectomy or stenting.   On review of operative record a long right carotid endarterectomy was  required with endarterectomy extended down to the base of the right  neck.  Stenosis is noted to be proximal and this may preclude redo  endarterectomy as a viable option if the stenosis extends down to the  ostial right common carotid artery.   She is currently on aspirin and has been given a sample of Plavix.   BP is 140/80, pulse 70 per minute.   We will plan to go ahead with an arch aortogram with carotid  arteriography and possible intervention with right carotid stenting if a  high-grade proximal common carotid lesion is evident.  If this lesion is  more distal and easily assessable by surgery surgical options will be  undertaken.   Balinda Quails, M.D.  Electronically Signed   PGH/MEDQ  D:  02/03/2008  T:  02/04/2008  Job:  [id number]   cc:   Pramod P. Pearlean Brownie, MD  Veverly Fells. Excell Seltzer, MD

## 2010-10-22 NOTE — Assessment & Plan Note (Signed)
OFFICE VISIT   [name], Brenda L  DOB:  11/04/1960                                       10/09/2009  CHART#:[id number]   DATE OF SURGERY:  05/10/2008.   Patient is a very pleasant 50 year old woman with an extensive  peripheral vascular history.  She has undergone bilateral carotid  endarterectomies and a right subclavian carotid bypass with a right  vertebral transposition.  She returns to clinic for follow-up today.  She states that she has had no problems with amaurosis fugax or steal-  like symptoms in her right hand.  Her blood pressure and  hypercholesterolemia are currently well controlled by Dr. Briley Mince.   SOCIAL HISTORY:  She is married with 2 children.  She discontinued  tobacco in 2007.   REVIEW OF SYSTEMS:  Significant for a weight gain.  She does have some  shortness of breath with exertions.  She does have some pain in her legs  when walking and headaches and arthritis.   PHYSICAL FINDINGS:  A very well-nourished woman in no apparent distress.  Heart rate was 80.  Blood pressure was 140/92 in the right arm and  117/89 in the left arm.  O2 saturation was 93%.  She was alert,  oriented.  HEENT:  NCAT, PERRLA, EOMI with normal conjunctiva.  Lungs  were clear.  Cardiac exam revealed a regular rate and rhythm.  The  abdomen was soft, nontender.  Musculoskeletal exam demonstrated no major  deformities.  Neurological exam demonstrated no focal weaknesses.  She  had well-healed incisions on her neck, both the right and left side.  Her radial pulses were palpable.   She did undergo Dopplers of her carotid arteries.  She had bilateral mid  ICA velocities suggestive of 40% to 59% narrowing stenosis.  There was a  patent right subclavian artery to the bifurcation.  The right vertebral  artery appeared to be antegrade.  The flow was not visualized in the  left temporal artery.  Her bypass graft was patent.   Patient continues to do well following  her surgery for carotid arteries  and her occluded right subclavian artery.  We will continue her  surveillance on a yearly basis.  She will return to clinic in 1 year for  follow-up, at which time she will undergo Dopplers of her carotid  systems.   Wilmon Arms, PA   Quita Skye Hart Rochester, M.D.  Electronically Signed   KEL/MEDQ  D:  10/09/2009  T:  10/09/2009  Job:  [id number]

## 2010-10-22 NOTE — Consult Note (Signed)
Brenda [name], Brenda [name]               ACCOUNT NO.:  [id number]   MEDICAL RECORD NO.:  [id number]          PATIENT TYPE:  INP   LOCATION:  2116                         FACILITY:  [facility]   PHYSICIAN:  Hillis Range, MD       DATE OF BIRTH:  08-31-1960   DATE OF CONSULTATION:  DATE OF DISCHARGE:                                 CONSULTATION   ADDENDUM  Dr. Hillis Range came to see Ms. Fleischer.  He spent a significant amount  of time at the bedside managing her hypertension and evaluating her  tachycardia.  She was significantly hypertensive and critical care time  was 40 minutes.      Theodore Demark, PA-C      Hillis Range, MD  Electronically Signed    RB/MEDQ  D:  05/10/2008  T:  05/11/2008  Job:  [id number]

## 2010-10-22 NOTE — Consult Note (Signed)
Brenda [name], Brenda [name]               ACCOUNT NO.:  [id number]   MEDICAL RECORD NO.:  [id number]          PATIENT TYPE:  INP   LOCATION:  2899                         FACILITY:  [facility]   PHYSICIAN:  Marin Roberts, MDDATE OF BIRTH:  May 14, 1961   DATE OF CONSULTATION:  DATE OF DISCHARGE:  03/15/2008                                 CONSULTATION   REFERRING PHYSICIAN:  Balinda Quails, MD   REASON FOR CONSULTATION:  Intracranial interpretation of carotid  arteriogram.   FINDINGS:  A left common carotid arteriogram was performed with imaging  over the head.  This demonstrates normal appearance of the distal  internal carotid artery to the ICA terminus.  The A1 and M1 segments are  within normal limits.  The posterior cerebral artery on the left fills  from this injection.  There is faint flash filling of the right PCA from  this injection as well suggesting cross flow across the basilar tip.  Both A2 segments fill from this injection.  There is no definite  retrograde flow within the right A1 segment.  There are no proximal  stenoses, aneurysms, or branch vessel occlusions.  The dural sinuses  normally fill.   1. Filling of both A2 segments from the left-sided injection.  2. No retrograde flow in the right A1 segment.  3. Fetal type left posterior cerebral artery with flash filling of the      right PCA from this injection as well suggesting flow across the      basilar tip.      Marin Roberts, MD  Electronically Signed     CM/MEDQ  D:  03/16/2008  T:  03/16/2008  Job:  [id number]

## 2010-10-22 NOTE — Progress Notes (Signed)
[facility]                        PERIPHERAL VASCULAR OFFICE NOTE   CHANEL, MCADAMS                      MRN:          [id number]  DATE:05/03/2008                            DOB:          01-22-1961    REASON FOR VISIT:  Renal artery stenosis.   HISTORY OF PRESENT ILLNESS:  Brenda [name] is a 50 year old woman with  severe diffuse vascular disease.  She has had multiple renal and carotid  interventions.  She recently has undergone repeat carotid angiography  and was found to have a patent left carotid endarterectomy site.  However, she has an occluded right common carotid artery, as well as  severe stenosis of the right subclavian artery proximal to a diseased  right vertebral artery.  She is going to undergo surgical  revascularization next week by Dr. Madilyn Fireman.  She will undergo right  subclavian repair with patch angioplasty followed by right subclavian to  carotid bypass and reimplantation of the right vertebral artery.   She has been followed here for renal artery stenosis.  She was found to  have severe de novo of the left renal artery stenosis at the time of  angiography this Spring and underwent stenting.  She also had severe in-  stent restenosis involving the right renal artery.  She underwent  balloon angioplasty of that vessel.  Her blood pressure is impossible to  follow because of bilateral upper extremity arterial disease.  At the  time of her last angiogram I measured her central aortic pressure  simultaneously with her left arm pressure and there was an approximate  70-mm gradient.   She underwent renal duplex scan on April 13, 2008, and this  demonstrated normal bilateral kidney size with low amplitude flow to the  right kidney.  The right renal ostium was not well visualized, but there  was a moderately elevated velocity of 236 cm/sec with a renal to aortic  ratio of 1.74.  The left renal artery had robust flow with a renal to  aortic ratio of 1.6.   From a symptomatic standpoint, she complains of unstable right leg  claudication symptoms involving the right calf with moderate level  activity.  She is able to walk on a treadmill at a slow pace for 30  minutes without stopping.  However, when she walks outdoors, she has  claudication symptoms at short distances.  She has no rest pain.   MEDICATIONS:  1. Lasix 20 mg every other day.  2. Potassium chloride 20 mEq every other day.  3. Coreg 25 mg twice daily.  4. Advair 100/50 mcg daily.  5. Spiriva daily.  6. Diovan 80 mg daily.  7. Crestor 40 mg daily.  8. Aspirin 325 mg daily.   ALLERGIES:  NKDA.   PHYSICAL EXAMINATION:  VITAL SIGNS:  The patient is alert and oriented.  She is in no acute distress.  VITAL SIGNS:  Weight is 212 pounds, heart rate 72, respiratory rate 16.  HEENT:  Normal.  NECK:  Normal carotid upstrokes with bilateral bruits, left greater than  right.  CHEST:  There are bruits in both clavicular  areas.  LUNGS:  Clear bilaterally.  HEART:  Regular rate and rhythm with a 2/6 harsh systolic ejection  murmur.  ABDOMEN:  Soft, nontender, no organomegaly.  No abdominal bruits.  EXTREMITIES:  No clubbing, cyanosis, or edema, the left posterior tibial  pulses 2+, the right is not palpable.   ASSESSMENT:  1. Severe extracranial cerebrovascular disease.  Management per Dr.      Madilyn Fireman.  2. Bilateral renal artery stenosis status post stenting and repeat      angioplasty procedures.  3. Lower extremity peripheral arterial disease with intermittent      claudication.   DISCUSSION:  We will plan on a repeat renal duplex in 6 months.  I think  her last duplex scan was suspicious for recurrent restenosis of the  right renal artery.  The problem is that she does not have very good  treatment options.  Consideration could be made for using a drug eluting  stent or covered stent in the proximal right renal artery.  I think  technically this would  be difficult to do because of her renal artery  anatomy.  I was unable to pass a cutting balloon across the renal artery  at the time of her last procedure and it would be very difficult to go  from an upper extremity access approach because of her severe upper  extremity arterial disease.  I would favor conservative management and  continued to have control of her hypertension.  This is also complicated  by inability to get an accurate blood pressure.  We will need to follow  her renal function as well as her renal size closely by duplex scanning.  As noted above, she is going to undergo surgical revascularization of  her right carotid and subclavian arteries next week.  I will plan on  seeing her back in the clinic in 6 months with a renal duplex scan.     Veverly Fells. Excell Seltzer, MD  Electronically Signed    MDC/MedQ  DD: 05/03/2008  DT: 05/04/2008  Job #: [id number]   cc:   Balinda Quails, M.D.

## 2010-10-22 NOTE — Assessment & Plan Note (Signed)
OFFICE VISIT   SHAELEIGH, GRAW  DOB:  [birth year]                                       05/25/2008  CHART#:[id number]   The patient underwent right subclavian carotid bypass and transposition  of right vertebral artery on May 10, 2008.  She had no significant  problems directly related to her surgery.  She did, however, develop  some congestive heart failure and hypoxemia postoperatively.  She was  treated with Lasix and supplemental oxygen.  Discharged home on oxygen  therapy.  She remains on 3 liters of oxygen at this time.  We did check  a room air sat, which was 90%, and have moved her O2 down to 2 liters  nasal cannula.   Her pulse is 81 per minute, O2 sat 90%.  She does have bilateral  subclavian stenoses and arm pressures are not accurate.   Right neck incision healing well.  Voice is normal.  Cranial nerves  intact.  Strength equal bilaterally.   Reduced oxygen at 2 liters nasal cannula.  Recommended follow-up with  cardiology.   PLAN:  Will return in 1 month for continued follow-up and evaluation.   Balinda Quails, M.D.  Electronically Signed   PGH/MEDQ  D:  05/25/2008  T:  05/26/2008  Job:  [id number]   cc:   Pramod P. Pearlean Brownie, MD  Veverly Fells. Excell Seltzer, MD  Bevelyn Buckles. Bensimhon, MD

## 2010-10-22 NOTE — Progress Notes (Signed)
[facility]                        PERIPHERAL VASCULAR OFFICE NOTE   SHARECE, FLEISCHHACKER                      MRN:          [id number]  DATE:07/07/2008                            DOB:          10-06-1960    REASON FOR VISIT:  Followup renovascular disease.   HISTORY OF PRESENT ILLNESS:  Brenda [name] is a 50 year old woman with  extensive vascular disease.  She has bilateral subclavian artery disease  and severe carotid intervertebral occlusive disease.  She underwent  right subclavian carotid bypass and transposition of the right vertebral  artery in December of last year.  Her postoperative course was  complicated by hypoxemia and congestive heart failure which has  improved.  She is just about back to her baseline.  She has a history of  renal artery stenosis and has had bilateral renal artery stenting, as  well as balloon angioplasty for treatment of recurrent renal restenosis.  Her last renal intervention was in April 2009, where she underwent left  renal artery stenting and right renal artery angioplasty.  Her followup  is complicated by her severe subclavian disease and inability to  accurately measure blood pressure any of the arms or legs.  Her last  renal duplex was performed on April 13, 2008, and showed normal kidney  size bilaterally with decreased amplitude of flow in the right kidney.  The left renal artery velocities were only mildly elevated with good  flow through that kidney.   She also has lower extremity PAD and complains of intermittent right  calf claudication with moderate level of activity.  She has no rest pain  or history of ischemic ulcerations.  Her last ABIs from 2008, were 0.73  on the right and 0.91 on the left.  She has not undergone lower  extremity intervention or surgery.   MEDICATIONS:  Lasix 20 mg every other day, potassium chloride 20 mEq  every other day, Advair daily, Spiriva, Diovan 80 mg daily, aspirin  325  mg daily, simvastatin 80 mg bedtime, Coreg 3.125 mg b.i.d., Benicar 10  mg daily, oxygen 3 L as needed, Benadryl as needed.   ALLERGIES:  NKDA.   PHYSICAL EXAMINATION:  GENERAL:  She is alert and oriented.  No acute  distress.  VITAL SIGNS:  Weight is 203 pounds, blood pressure was 94/62 in the  right arm, unable to check in the left arm, heart rate 76.  HEENT:  Normal.  NECK:  Normal carotid upstrokes, bilateral bruits.  JVP normal.  LUNGS:  Clear bilaterally.  HEART:  Regular rate and rhythm.  No murmurs, gallops.  ABDOMEN:  Soft, nontender, no organomegaly.  No abdominal bruits.  EXTREMITIES:  Femoral pulses are 2+.  DP and PT pulses are 2+ on the  left and trace on the right.   ASSESSMENT:  1. Carotid artery occlusive disease.  Management per Dr. Madilyn Fireman.  The      patient is recovering well from surgery.  2. Lower extremity peripheral arterial disease.  We will follow up      with lower extremity ankle-brachial index and duplex in May at the  time of her return visit.  3. Renal artery stenosis.  She appears to have recurrent restenosis of      the right kidney with low amplitude flow.  Fortunately, her kidney      size has remained stable.  Her treatment options at this point      include observation versus revascularization with either a drug-      eluting or covered stent.  The latter to would mandate dual      antiplatelet therapy with aspirin and Plavix.  Medication cost is a      major issue and she has applied for Medicaid.  I think we should      follow up a renal duplex at a 88-month interval to reassess in May      and allow her time to call and allowed time to qualify for      Medicaid, so that she can obtain the necessary medication.  Again,      treating her renal artery stenosis is complicated by the fact that      we cannot obtain any accurate blood pressure readings.  At the time      of her last renal intervention, there is approximately an 80-mm       gradient between her arm pressure and central aortic pressure.     Veverly Fells. Excell Seltzer, MD  Electronically Signed    MDC/MedQ  DD: 07/11/2008  DT: 07/12/2008  Job #: [id number]   cc:   Balinda Quails, M.D.  Bevelyn Buckles. Bensimhon, MD

## 2010-10-22 NOTE — Assessment & Plan Note (Signed)
OFFICE VISIT   Brenda [name], Brenda [name]  DOB:  10-28-1960                                       11/25/2007  CHART#:[id number]   The patient returned to the office today for continued surveillance of  her carotid disease.  She has undergone bilateral carotid  endarterectomies, a right carotid endarterectomy in February of 2008,  and a left carotid endarterectomy in December of 2008.   The patient has very aggressive atherosclerotic vascular disease.  She  has a remote history of heavy tobacco use, although now has quit  smoking.  Her diagnoses include peripheral vascular disease,  cerebrovascular disease, nonischemic cardiomyopathy, hypertension, COPD,  chronic anemia and dyslipidemia.   Her carotid Doppler carried out today reveals a severe stenosis of the  common carotid artery with velocities 612/187 cm/sec.  The left carotid  system is free of significant restenosis.   On reviewing the operative note, the patient underwent an extensive  endarterectomy in the right, well down into the right common carotid  artery due to very severe plaque disease.   Fortunately, the patient has been free of any symptoms.  Denies sensory,  motor, or visual deficit.  No speech problems.   EVALUATION:  Reveals a 50 year old female who appears older than her  stated age.  Alert and oriented.  No acute distress.  Blood pressure  110/70, pulse 78 per minute.  She has a harsh right carotid bruit.  No  significant left carotid bruit.  Cranial nerves intact.  Strength equal  bilaterally.   CURRENT MEDICATIONS:  Include Lasix, Crestor, simvastatin, carvedilol,  Diovan, Klor-Con, aspirin, Ben-Gay, Tylenol, and Spiriva.   The patient has evidence of a severe recurrent stenosis in the right  common carotid artery; this will likely best be treated with a carotid  stent.  I plan to refer her over to Dr. Delia Heady for a pre-stent  evaluation.  Once he has had a chance to evaluate her,  we will then plan  to go ahead with an arteriogram and potential stenting of the recurrent  right common carotid stenosis.   Balinda Quails, M.D.  Electronically Signed   PGH/MEDQ  D:  11/25/2007  T:  11/25/2007  Job:  [id number]   cc:   Talmadge Coventry, M.D.  Bevelyn Buckles. Bensimhon, MD  Pramod P. Pearlean Brownie, MD

## 2010-10-22 NOTE — Op Note (Signed)
NAMEKHLOIE, [name]               ACCOUNT NO.:  [id number]   MEDICAL RECORD NO.:  [id number]          PATIENT TYPE:  INP   LOCATION:  2116                         FACILITY:  [facility]   PHYSICIAN:  Balinda Quails, M.D.    DATE OF BIRTH:  09/21/1960   DATE OF PROCEDURE:  05/10/2008  DATE OF DISCHARGE:                               OPERATIVE REPORT   SURGEON:  Balinda Quails, MD   FIRST ASSISTANT:  Quita Skye. Hart Rochester, MD   SECOND ASSISTANT:  Wilmon Arms, PA   ANESTHESIA:  General endotracheal.   PREOPERATIVE DIAGNOSES:  1. Right common carotid artery occlusion.  2. High-grade right vertebral artery stenosis.   POSTOPERATIVE DIAGNOSES:  1. Right common carotid artery occlusion.  2. High-grade right vertebral artery stenosis.   PROCEDURES:  1. Right subclavian carotid bypass.  2. Right vertebral transposition.   CLINICAL NOTE:  Brenda [name] is a 50 year old female with advanced  atherosclerotic disease and history of bilateral carotid  endarterectomies.  She showed evidence of recurrent stenosis of the  right carotid system and arteriography revealed total occlusion of the  right common carotid artery.  Patency of the right carotid bifurcation  with retrograde right external carotid and antegrade right internal  carotid flow.  There was aberrant takeoff of her right subclavian artery  from the left side of the aortic arch, complex plaque at the origin of  the right vertebral artery with a high-grade stenosis at the right  dominant vertebral artery.   Plan at this time is to perform a right subclavian carotid bypass with  reimplantation of diseased right vertebral artery.   Details of the operative procedure were explained to the patient.  Potential risks were discussed, this being redo surgery including the  potential risk of stroke, MI, cranial nerve injury, and death.   OPERATIVE PROCEDURE:  The patient was brought to the operating room in  stable hemodynamic condition.   Arterial line was placed in left upper  extremity.  General endotracheal anesthesia was induced.  Right neck was  prepped and draped in a sterile fashion.   A curvilinear skin incision was made through the scar in the right neck  and the incision extended proximally down to the sternal notch.  Subcutaneous tissue was divided with electrocautery.  The right  sternomastoid muscle was mobilized and reflected laterally.  The right  common carotid artery was freed proximally in the neck and the vagus  nerve reflected posteriorly free of harm.  The right common carotid  artery was dissected up to the site of previous endarterectomy.  There  was a patch angioplasty present in the right carotid bifurcation.  This  was a Dacron patch.  This was dissected out and the origin of the right  superior thyroid and external carotid were freed and encircled with  vessel loops.  Distal dissection was carried up to the right internal  carotid artery.  This was a small vessel due to hyperperfusion.  The  right hypoglossal nerve identified and retracted superiorly.  The distal  right internal carotid artery beyond the endarterectomy site was  freed  and encircled with a vessel loop.   Attention was then placed on dissection of the right subclavian and  vertebral artery.  This was carried out at the base of the neck.  The  right vagus nerve was mobilized and was preserved throughout its course  down to the base of the neck and retracted medially.  The right  recurrent laryngeal nerve was identified coursing underneath the  subclavian artery and this was dissected out and preserved.  The right  subclavian artery was palpated and freed proximally down to the base of  the neck and encircled proximally with a vessel loop.  Distal dissection  was then carried along the right subclavian artery and the origin of the  right vertebral artery was identified.  A complex plaque was present in  the area of the right  vertebral artery which was mobilized and encircled  with a vessel loop.  The thyrocervical trunk was also identified and  preserved.   The patient was administered 7000 units of heparin intravenously.   The right subclavian artery was controlled proximally in the neck with a  Henley clamp and distally with a DeBakey clamp.  A longitudinal incision  was made with a 11 blade and it was opened with a 5 punch.  A 6-mm  Dacron graft was beveled and anastomosed end-to-side to the proximal  right subclavian artery using a running 6-0 Prolene suture.  At  completion of this, the artery was flushed and all clamps were removed  from the subclavian and vertebral and the graft was controlled with a  fistula clamp.  The graft then brought up to the exposed right carotid  bifurcation.  The right common external and internal carotid arteries  along with the superior thyroid were controlled with clamps.  A  longitudinal arteriotomy made over the hood of the Dacron patch and  extended out into the right internal carotid artery.  The subclavian  Dacron graft was then beveled and anastomosed end-to-side to the right  carotid bifurcation at the site of patch angioplasty using running 6-0  Prolene suture.  At completion of this, all vessels were well flushed  and clamps were then removed.   Attention was then placed on the vertebral artery.  This was ligated at  its origin with a 2-0 silk and divided and beveled.  The right  subclavian carotid graft was then controlled proximally and distally  with clamps, opened with 11 blade, and 4 punch.  The vertebral artery  was anastomosed end-to-side to the right subclavian carotid graft using  running 6-0 Prolene suture.  At completion of this, adequate flushing  carried out.  Clamps were then removed.   The patient was administered 50 mg of protamine intravenously.  Adequate  hemostasis was obtained.   The neck again examined and these hypoglossal, vagus, and  recurrent  laryngeal nerves were all intact.   The neck was drained with a 15 round Blake drain exited inferior to the  skin.  Fixed to skin with 2-0 silk suture.   The sternomastoid fascia was then closed with running 2-0 Vicryl suture.  Platysma closed with running 3-0 Vicryl suture.  Skin closed with 4-0  Monocryl.  Dermabond applied.   The patient tolerated the procedure well.  There were no apparent  complications.  Awakened readily.  Moved all extremities to command.  No  apparent complications.  Transferred to recovery room in stable  condition.      Balinda Quails, M.D.  Electronically Signed     PGH/MEDQ  D:  05/10/2008  T:  05/11/2008  Job:  [id number]   cc:   Bevelyn Buckles. Bensimhon, MD

## 2010-10-22 NOTE — Procedures (Signed)
CAROTID DUPLEX EXAM   INDICATION:  Follow up known carotid artery disease.   HISTORY:  Diabetes:  No.  Cardiac:  No.  Hypertension:  Yes.  Smoking:  Quit 9 months ago.  Previous Surgery:  Right carotid endarterectomy.  CV History:  Amaurosis Fugax  No  Paresthesias  No  Hemiparesis  No                                       RIGHT             LEFT  Brachial systolic pressure:         140.              160.  Brachial Doppler waveforms:         Biphasic.         Biphasic.  Vertebral direction of flow:        Antegrade.        Not well  visualized.  DUPLEX VELOCITIES (cm/sec)  CCA peak systolic                   108.              88.  ECA peak systolic                   170.              252.  ICA peak systolic                   334 (mid).        578.  ICA end diastolic                   67.               231.  PLAQUE MORPHOLOGY:                  Heterogenous.     Heterogenous.  PLAQUE AMOUNT:                      Moderate to severe.                 Severe.  PLAQUE LOCATION:                    ICA and ECA.      ICA and ECA.   IMPRESSION:  80% to 99% stenosis noted in the left ICA.  60% to 79%  stenosis noted in the right mid ICA.  Status post right carotid  endarterectomy.  Antegrade right vertebral artery.   ___________________________________________  P. Liliane Bade, M.D.   MG/MEDQ  D:  03/04/2007  T:  03/05/2007  Job:  [id number]

## 2010-10-22 NOTE — Assessment & Plan Note (Signed)
OFFICE VISIT   Brenda [name], Brenda [name]  DOB:  1961/01/21                                       06/17/2007  CHART#:[id number]   The patient is status post left carotid endarterectomy carried out  05/24/2007 at [facility].  Discharged home in good condition  postop day 1.  No major complications associated with this operation.  No complaints at this time.   BP 110/80 left arm, 99/73 right arm.  Pulse 77 per minute, respirations  16 per minute.  O2 sat 98%.   Left neck incision healing unremarkably.  Mild left marginal mandibular  palsy.  Cranial nerves otherwise intact.  Strength equal bilaterally.   The patient is doing well following her surgery.  We will plan followup  per protocol in 6 months with a carotid Doppler evaluation.   Balinda Quails, M.D.  Electronically Signed   PGH/MEDQ  D:  06/17/2007  T:  06/18/2007  Job:  [id number]   cc:   Bevelyn Buckles. Bensimhon, MD  Talmadge Coventry, M.D.

## 2010-10-22 NOTE — Op Note (Signed)
Brenda [name], Brenda [name]               ACCOUNT NO.:  [id number]   MEDICAL RECORD NO.:  [id number]          PATIENT TYPE:  INP   LOCATION:  2899                         FACILITY:  [facility]   PHYSICIAN:  Juleen China IV, MDDATE OF BIRTH:  December 01, 1960   DATE OF PROCEDURE:  03/15/2008  DATE OF DISCHARGE:  03/15/2008                               OPERATIVE REPORT   ATTENDING SURGEONS:  1. Juleen China IV, MD  2. Balinda Quails, MD   DIAGNOSIS:  Bilateral carotid stenosis status post carotid  endarterectomy.   PROCEDURES:  1. Ultrasound access, right common femoral artery.  2. Aortic arch angiogram.  3. First-order catheterization, left carotid artery.  4. Left carotid angiogram.  5. First-order catheterization, right subclavian artery.  6. Right subclavian angiogram.  7. First-order catheterization, left subclavian artery.  8. Left subclavian angiogram.   INDICATIONS:  This is a 50 year old female who is status post bilateral  carotid endarterectomy.  By duplex criteria, she was found to have high-  grade stenosis on the right side.  She comes in for arteriogram with  possibility intervention.  Risks and benefits were discussed.  Informed  consent was signed.   PROCEDURE IN DETAIL:  The patient was identified in the holding area and  taken to room 8.  She was placed supine on the table.  The right groin  was prepped and draped in a standard sterile fashion.  Time-out was  called.  The right common femoral artery was evaluated with an  ultrasound and found to be patent.  Lidocaine 1% was used for local  anesthesia.  The right common femoral artery was accessed under  ultrasound guidance with an 18-gauge needle.  An 0.035 Wholey wire was  advanced into the aorta under fluoroscopic visualization and a 5-French  sheath was placed.  Over the wire, a pigtail catheter was advanced into  the ascending aorta and an aortic arch angiogram was obtained.  Next,  selective images were  performed.  A SOS I catheter was used to select  the left common carotid artery.  A Bernstein II catheter was used to  select the right subclavian and the left subclavian artery for their  respective imaging.   FINDINGS:  Aortic arch:  An aberrant right subclavian artery is  visualized originating distal to the left subclavian artery.  There is a  high-grade stenosis proximal to the vertebral artery.  There is stenosis  at the origin of the right vertebral artery.   There is a common origin to the left and right common carotid arteries.  The right common carotid artery is occluded.  The left common carotid  artery is patent.  There is an area of dilatation in the common carotid  artery consistent with a previous patch repair.  There is approximately  40% stenosis just proximal to this.  The left subclavian artery is  patent throughout its course.  There is a focal stenosis distal to the  vertebral artery.  The vertebral artery is occluded once it enters the  bony segment in the V2 distribution.   Left carotid angiogram:  The left common carotid artery is patent.  There is approximately 40-50% stenosis at the origin of the patch.  The  carotid bifurcation is widely patent.  Please see intracranial  interpretation dictated by Neuroradiology.   Left subclavian angiogram:  There is an area of stenosis approximately  40% in the left subclavian artery distal to the vertebral artery.  The  vertebral artery terminates approximately 3 cm from its origin.   Right subclavian artery:  There is an area of stenosis within the right  subclavian artery proximal to and leading up to the origin of the  vertebral artery.  There is a focal stenosis at the origin of the  vertebral artery.  On these selective images, there is reconstitution of  the right carotid artery at its bifurcation.   After these images were obtained, decision was made to terminate the  procedure.  The patient was taken to the  holding area for sheath pull.  She was neurologically intact.   IMPRESSION:  1. Aberrant right subclavian artery.  2. Occluded right common carotid artery.  There is reconstitution of      the external and internal carotid artery on the right.  3. High-grade stenosis at the origin of the right vertebral artery and      in the right subclavian artery just proximal to the origin of the      right vertebral artery.  The left vertebral artery is occluded.           ______________________________  V. Charlena Cross, MD  Electronically Signed     VWB/MEDQ  D:  03/15/2008  T:  03/16/2008  Job:  [id number]

## 2010-10-22 NOTE — Assessment & Plan Note (Signed)
[facility]                            CARDIOLOGY OFFICE NOTE   DORE, OQUIN                      MRN:          [id number]  DATE:04/19/2007                            DOB:          22-Sep-1960    PRIMARY CARE PHYSICIAN:  Talmadge Coventry, M.D.   INTERVAL HISTORY:  [name] is a delightful 50 year old woman with  diffuse vascular disease and a nonischemic cardiomyopathy with ejection  fraction of about 35-40%.  She was recently hospitalized for  pyelonephritis which was treated with antibiotics.  She was discharged  last week.   She returns today.  She says she feels much better.  She is going to  follow up with the urologist tomorrow.  Apparently she still has a  kidney stone that they are deciding what to do with.   She denies any lower extremity edema, no PND or orthopnea, she is  walking on the treadmill for a half an hour every day with just mild  claudication.  She has not been having problems with her blood pressure.   CURRENT MEDICATIONS:  1. Lasix 20 a day.  2. Potassium 20 a day.  3. Simvastatin 40 a day.  4. Aspirin 325 a day.  5. Coreg 12.5 b.i.d.  6. Flomax 0.4 a day.   PHYSICAL EXAMINATION:  VITAL SIGNS:  Blood pressure 124/62, heart rate  72, weight 182.  GENERAL APPEARANCE:  She is well-appearing, in no acute distress,  ambulates around the clinic without any respiratory difficulty.  HEENT:  Normal.  NECK:  Supple.  She has evidence of a right carotid endarterectomy scar.  There is mild swelling lateral to this but there is no erythema or  fluctuance.  Carotids are 2+ bilaterally with bilateral bruits.  There  is no JVD.  No lymphadenopathy or thyromegaly.  LUNGS:  Clear.  CARDIOVASCULAR:  PMI is nondisplaced.  She is regular with 2/6 systolic  ejection murmur at the right sternal border, S2 is crisp, no gallop.  ABDOMEN:  Soft, nontender, nondistended.  There is no  hepatosplenomegaly, no bruits, no masses.  No  CVA tenderness.  EXTREMITIES:  Warm with no clubbing, cyanosis, or edema.  No ulceration,  no rash.  NEUROLOGIC:  Alert and oriented x3.  Cranial nerves II-XII intact.  She  moves all four extremities without difficulty.  Affect is pleasant.   ASSESSMENT/PLAN:  1. Congestive heart failure secondary to nonischemic cardiomyopathy.      She is currently New York Heart Association Class II.  She is doing      well.  She is euvolemic.  I am not sure why she is not on ACE      inhibitor.  She does have issues with renal artery stenosis but I      do not think this is an absolute contraindication at this point as      she has been stented, although there is some evidence of      restenosis.  We will try her on lisinopril 10 mg a day and get a      BMET in one week.  2. Peripheral vascular disease.  Follow up with Dr. Excell Seltzer.  3. Recent pyelonephritis.  She is due to follow up with urologist      tomorrow.   DISPOSITION:  We will see her back in two months to continue titration  of her heart failure medications.     Bevelyn Buckles. Bensimhon, MD  Electronically Signed    DRB/MedQ  DD: 04/19/2007  DT: 04/19/2007  Job #: [id number]   cc:   Talmadge Coventry, M.D.

## 2010-10-22 NOTE — Op Note (Signed)
NAME:  Brenda [name], Brenda [name]               ACCOUNT NO.:  [id number]   MEDICAL RECORD NO.:  [id number]          PATIENT TYPE:  AMB   LOCATION:  NESC                         FACILITY:  [facility]   PHYSICIAN:  Lindaann Slough, M.D.  DATE OF BIRTH:  04-01-1961   DATE OF PROCEDURE:  04/22/2007  DATE OF DISCHARGE:                               OPERATIVE REPORT   PREOPERATIVE DIAGNOSIS:  Left ureteral stone with hydronephrosis.   POSTOPERATIVE DIAGNOSIS:  Left ureteral stone with hydronephrosis.   PROCEDURE:  Cystoscopy, left retrograde pyelogram, ureteroscopy, holmium  laser of left ureteral stone, stone extraction, and insertion of double-  J catheter.   SURGEON:  Danae Chen, M.D.   ANESTHESIA:  General.   INDICATIONS:  The patient is a 50 year old female who was seen in  consultation about a month ago for left pyelonephritis.  CT scan showed  a 5-mm stone in the left mid-ureter with mild-to-moderate  hydronephrosis.  The patient was treated with antibiotics and Flomax;  however, she has not passed the stone.  Repeat CT scan shows that the  stone is in the same location.  She is scheduled today for cystoscopy,  holmium laser of the ureteral stone, and insertion of double-J catheter.   DESCRIPTION OF PROCEDURE:  Under general anesthesia, the patient was  prepped and draped and placed in the dorsal lithotomy position.  A  panendoscope was inserted in the bladder.  The bladder mucosa is normal.  There is no stone or tumor in the bladder.  The ureteral orifices are in  normal position and shape.   Retrograde Pyelogram:   A sensor-tipped guidewire was passed through a #6-French open-ended  ureteral catheter and passed through the cystoscope into the left  ureteral orifice.  The Glidewire was advanced into the mid-ureter, and  the open-ended catheter was advanced in the distal ureter.  The sensor-  tipped guidewire was then removed.  Contrast was then injected through  the cone-tipped catheter.   There is a filling defect in the mid-ureter,  and the ureter proximal to the filling defect is moderately dilated.  The sensor-tipped guidewire was then passed through the open-ended  catheter all the way up into the renal pelvis, and the open-ended  catheter was removed.   The cystoscope was removed.  A #6-French rigid ureteroscope was then  passed in the bladder and into the ureter.  The stone was visualized in  the mid-ureter.  The stone was then fragmented into multiple stone  fragments with the holmium laser.  The stone fragments were then removed  with the nitinol stone basket.  There was no evidence of remaining stone  fragment in the ureter.  Contrast was then injected through the  ureteroscope, and there was no evidence of extravasation of contrast,  and there was no evidence of remaining filling defect in the ureter.  The proximal ureter, renal pelvis, and collecting system are moderately  dilated.  The ureteroscope was then removed.  The guidewire was then  back loaded into the cystoscope, and a #6-French 26 double-J catheter  was passed over the guidewire.  The proximal  curl of the double-J  catheter is in the renal pelvis.  The distal curl is in the bladder.  The  string was left attached to the double-J catheter.  The bladder was then  emptied and the cystoscope and guidewire removed.   The patient tolerated the procedure well and left the OR in satisfactory  condition to postanesthesia care unit.      Lindaann Slough, M.D.  Electronically Signed     MN/MEDQ  D:  04/22/2007  T:  04/23/2007  Job:  [id number]

## 2010-10-22 NOTE — Assessment & Plan Note (Signed)
[facility]                            CARDIOLOGY OFFICE NOTE   ANE, CONERLY                      MRN:          [id number]  DATE:09/21/2007                            DOB:          04-Jun-1961    PRIMARY CARE PHYSICIAN:  Talmadge Coventry, M.D.   INTERVAL HISTORY:  Ms. Ireland is a delightful 50 year old woman with  severe vascular disease and nonischemic cardiomyopathy, ejection  fraction of 35-40%.  She is status post bilateral carotid  endarterectomies and stenting of her right renal artery.   She has been followed by Dr. Excell Seltzer in the peripheral vascular disease  clinic for claudication.  She says she is doing much better.  She is  exercising on the treadmill for 35 minutes every day, walking 2.5 miles  an hour without any symptoms.  However, she says once she get outside,  she has some claudication in the right leg.  She has not had any  ulceration.  From a cardiac point of view she has been doing great.  No  chest pain.  She denies orthopnea, no PND, no significant lower  extremity edema.  She has put on some weight due to the fact that she  has stopped smoking.   She recently had a renal ultrasound which showed progression of her  bilateral renal artery stenosis and, in fact, her kidneys are getting  just a little bit smaller.   CURRENT MEDICATIONS:  1. Aspirin 325 mg a day.  2. Coreg 12.5 mg b.i.d.  3. Simvastatin 80 mg a day.  4. Lisinopril 10 mg a day.  5. Lasix 20 mg every other day.  6. Potassium 20 mEq every other day.   ALLERGIES:  No known drug allergies.   SOCIAL HISTORY:  Lives in [place] with two children.  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  She smoked two packs a day for a number of  years but quit December 2007.  No alcohol use.   FAMILY HISTORY:  Mother died at 49 from complications of coronary bypass  surgery.  Father died at 39 due to cancer.  Five siblings are alive and  well.   PHYSICAL EXAM:  She is  well-appearing, in no acute distress.  Ambulates  around the clinic without any respiratory difficulty.  Blood pressure is very difficult to auscultate.  It is really  unobtainable in the left arm.  In the right arm I get 112/80.  Heart  rate 68.  Weight is 187.  HEENT:  Normal.  NECK:  Supple.  There are bilateral carotid endarterectomy scars with  soft bilateral bruits.  There is no lymphadenopathy or thyromegaly.  LUNGS:  Clear.  CARDIAC:  PMI is nondisplaced.  Regular rate and rhythm with a 2/6  systolic ejection murmur at the right sternal border.  S2 is crisp.  There is no gallop.  ABDOMEN:  Soft, nontender, nondistended.  No bruits or masses.  No  hepatosplenomegaly.  EXTREMITIES:  Warm with no cyanosis, clubbing or edema.  No rash.  NEUROLOGIC:  Alert and oriented x3.  Cranial nerves II-XII are intact.  She moves all four extremities without difficulty.  Affect is pleasant.   ASSESSMENT/PLAN:  1. Progressive bilateral renal artery stenosis with slight diminution      of her kidney size.  She has been evaluated Dr. Excell Seltzer and will be      proceeding with angiogram and probable stenting to the next week or      two.  Otherwise, her vascular disease seems stable.  She does      appear to have significant upper extremity vascular disease, which      is no surprise.  It will be helpful to know difference between her      central aortic pressure and her cuff pressures.  2. Nonischemic cardiomyopathy.  She is doing well, New York Heart      Association functional class I.  Volume status looks good.  After      her procedure we will focus on titrating her beta-blocker and ACE      inhibitor.  3. Hyperlipidemia.  Goal LDL is less than 70.  Continue simvastatin.      Recheck lipids tomorrow  4. Carotid artery disease.  This is followed in the vascular clinic.   DISPOSITION:  We will see her back in a couple months for routine follow-  up.     Bevelyn Buckles. Bensimhon, MD   Electronically Signed    DRB/MedQ  DD: 09/21/2007  DT: 09/21/2007  Job #: [id number]

## 2010-10-22 NOTE — Procedures (Signed)
CAROTID DUPLEX EXAM   INDICATION:  Followup known carotid artery disease.   HISTORY:  Diabetes:  No.  Cardiac:  No.  Hypertension:  Yes.  Smoking:  Quit.  Previous Surgery:  No.  CV History:  No.  Amaurosis Fugax No, Paresthesias No, Hemiparesis No                                       RIGHT             LEFT  Brachial systolic pressure:  Brachial Doppler waveforms:  Vertebral direction of flow:  DUPLEX VELOCITIES (cm/sec)  CCA peak systolic                   Occluded  ECA peak systolic                   Retrograde  ICA peak systolic                   55  ICA end diastolic                   32  PLAQUE MORPHOLOGY:                  Heterogenous  PLAQUE AMOUNT:                      Severe  PLAQUE LOCATION:                    CCA   IMPRESSION:  1. Limited study.  2. Occluded right CCA with retrograde flow to the ICA via the ECA.  3. High-grade stenosis noted at the proximal right vertebral artery.   ___________________________________________  P. Liliane Bade, M.D.   MG/MEDQ  D:  03/30/2008  T:  03/31/2008  Job:  [id number]

## 2010-10-22 NOTE — Consult Note (Signed)
Brenda [name], Brenda [name]               ACCOUNT NO.:  [id number]   MEDICAL RECORD NO.:  [id number]          PATIENT TYPE:  INP   LOCATION:  3734                         FACILITY:  [facility]   PHYSICIAN:  Lindaann Slough, M.D.  DATE OF BIRTH:  [birth year]   DATE OF CONSULTATION:  04/01/2007  DATE OF DISCHARGE:                                 CONSULTATION   REASON FOR CONSULTATION:  He has left ureteral stone with hydronephrosis  and pyelonephritis.   HISTORY:  The patient is a 50 year old female who was seen at the [facility] today with 3 days history of flank pain on and off.  The pain was  associated with nausea and vomiting.  She also had chest pain this  morning and she went to the Urgent Care and was then treated with  nitroglycerin.  She was then transferred to the emergency room here for  further evaluation.  She has a history of peripheral vascular disease  and  is scheduled for left carotid endarterectomy next week.  She has a  past history of kidney stones and has passed kidney stone in the past.  She was then treated here in [place] and in [place]. However, she  does not recall the name of the urologist.  She denies frequency,  urgency or hematuria.  And she had no previous surgery for kidney stone.  She had a stent in the right kidney for renal artery stenosis.  A CT  scan of the abdomen and pelvis showed a 5 mm left mid ureteral stone  with mild-to-moderate hydronephrosis and inhomogeneous low density mass  in the upper pole of the left kidney that was not present on the CT scan  done in February.  It is suggestive of pyelonephritis or lobar  nephronia.  The patient is doing well at this time.  She denies any  flank pain.   PAST MEDICAL HISTORY:  Positive for peripheral vascular disease, renal  artery stenosis, right side, and history of kidney stone.   PAST SURGICAL HISTORY:  She had right carotid endarterectomy, and she  had ear surgery in the past and stent placed in  the right kidney.   MEDICATIONS:  Coreg,  tramadol,  and Lasix.  Aspirin 325 mg daily,  simvastatin.   ALLERGIES:  NO KNOWN DRUG ALLERGIES.   SOCIAL HISTORY:  She is married, has two children.  Quit smoking about  be a year ago and does not drink.   FAMILY HISTORY:  Her mother died of a heart attack at age 42. Her father  had heart disease and died of brain cancer.  She has three sisters and  two brothers.   REVIEW OF SYSTEMS:  Is as noted in HPI and everything else is negative.   PHYSICAL EXAMINATION:  GENERAL:  This is a well-built 50 year old female  who is in no distress at this time.  She is alert and oriented.  VITAL SIGNS:  Blood pressure is 119/53, pulse 92, respirations 22,  temperature 101.3  HEENT:  Her head is normal.  She has pink conjunctivae.  Ears, nose and  throat are  within normal limits.  NECK:  Supple.  No cervical adenopathy.  No thyromegaly.  LUNGS:  Clear.  HEART:  Regular rhythm.  ABDOMEN:  Abdomen is soft.  It is not distended at this time.  Liver,  spleen and kidneys are not palpable.  She has no CVA tenderness.  Bladder is not distended.  She has no inguinal, umbilical or inguinal  hernia.  I did not do a pelvic examination.   LABORATORY DATA:  Urinalysis shows 21-50 wbc's and 0-2 rbc's, many  bacteria.  Creatinine is 1.1.  Hemoglobin 11.8, hematocrit 35, WBC 16.2.   I independently reviewed the CT scan and the findings are as noted in  the history.   IMPRESSION:  1. Left ureteral stone, mild-to-moderate hydronephrosis,      pyelonephritis.  2. Peripheral vascular disease.   PLAN:  Urine culture and sensitivity.  Start Ancef and gentamicin.  Flomax to help facilitate the passage of the stone.  If the patient does  not pass the stone or if she continues to have a fever, she will need  stent placement and possible stone manipulation or percutaneous  nephrostomy for drainage of the left kidney.      Lindaann Slough, M.D.  Electronically  Signed     MN/MEDQ  D:  04/01/2007  T:  04/03/2007  Job:  [id number]

## 2010-10-22 NOTE — Procedures (Signed)
CAROTID DUPLEX EXAM   INDICATION:  Followup, bilateral carotid endarterectomy.   HISTORY:  Diabetes:  No.  Cardiac:  No.  Hypertension:  Yes.  Smoking:  Quit about two years ago.  Previous Surgery:  Bilateral endarterectomy.  CV History:  Amaurosis Fugax No, Paresthesias No, Hemiparesis No                                       RIGHT             LEFT  Brachial systolic pressure:         100               110  Brachial Doppler waveforms:         Monophasic        Monophasic  Vertebral direction of flow:        Abnormal          Not well  visualized  DUPLEX VELOCITIES (cm/sec)  CCA peak systolic                   612/187           166  ECA peak systolic                   92                289  ICA peak systolic                   144 (mid)         136  ICA end diastolic                   64                48  PLAQUE MORPHOLOGY:                  Heterogenous      Heterogenous  PLAQUE AMOUNT:                      Mild              Mild  PLAQUE LOCATION:                    ICA, CCA, ECA     ICA, ECA   IMPRESSION:  1. Greater than 70% stenosis noted in the right proximal common      carotid artery.  2. 40-59% stenosis noted in bilateral internal carotid arteries.  3. Status post bilateral carotid endarterectomy.  4. Abnormal flow noted in the right vertebral artery.   ___________________________________________  P. Liliane Bade, M.D.   MG/MEDQ  D:  11/25/2007  T:  11/25/2007  Job:  [id number]

## 2010-10-22 NOTE — Consult Note (Signed)
Brenda [name], Brenda [name] NO.:  [id number]   MEDICAL RECORD NO.:  [id number]          PATIENT TYPE:  INP   LOCATION:  2116                         FACILITY:  [facility]   PHYSICIAN:  Hillis Range, MD       DATE OF BIRTH:  February 17, 1961   DATE OF CONSULTATION:  DATE OF DISCHARGE:                                 CONSULTATION   PRIMARY CARE PHYSICIAN:  Talmadge Coventry, MD   PRIMARY CARDIOLOGIST:  Bevelyn Buckles. Bensimhon, MD   PERIPHERAL VASCULAR:  Veverly Fells. Excell Seltzer, MD.   CVTS:  Balinda Quails, MD   CHIEF COMPLAINT:  Tachycardia and hypotension.   HISTORY OF PRESENT ILLNESS:  Brenda [name] is a 50 year old female with a  history of nonischemic cardiomyopathy and peripheral vascular disease.  She was admitted for right carotid-to-subclavian bypass today.  She had  no problems during surgery, but postoperatively she developed  tachycardia with a heart rate in the 110s and hypotension with a  systolic blood pressure that was dropping into the 60s.  Her symptoms  did not resolve with dopamine at 15 mcg/kg/minute and Cardiology was  asked to evaluate her.   Brenda [name] is sleepy, but arouses easily to verbal stimulus.  She answers  questions appropriately.  She denies chest pain or shortness of breath.  She has good pain control.  Prior to admission, she has had no recent  weight changes or heart failure symptoms.  She was seen in the [facility]  on May 03, 2008, and was having no chest pain with exertion and her  weight was stable.  She did not take her usual morning medicines of  Diovan 80 mg or furosemide 20 mg today, but she did take her carvedilol  25 mg this morning at 6:00 a.m.  Currently, she is being given IV fluid  resuscitation and periodic infusions of Neo-Synephrine with a drip  pending.   PAST MEDICAL HISTORY:  1. Her systolic blood pressure is currently in the 80s.  2. History of nonischemic cardiomyopathy with an EF of 35%-45% by      echocardiogram in August  2008.  3. Status post cardiac catheterization in December 2007 with mild      nonobstructive coronary artery disease and significant peripheral      arterial disease with bilateral renal artery stenosis and moderate      iliac disease as well as mild aortic stenosis.  4. Hypertension.  5. History of pulmonary nodule.  6. COPD.  7. History of anemia.  8. Cerebrovascular disease.  9. History of tobacco abuse, quit December 2007.  10.History of left ureteral stone and hydronephrosis.  11.History of fatty liver.  12.Family history of coronary artery disease.   SURGICAL HISTORY:  1. Right subclavian-to-carotid bypass on May 10, 2008.  2. History of bilateral carotid endarterectomy.  3. Status post bilateral renal artery stenting with repeat procedure      on the right including PTA.  4. Cardiac catheterization.  5. Cystoscopy and laser stone extraction of left ureteral stone.   ALLERGIES:  No known drug allergies.   MEDICATIONS PRIOR  TO ADMISSION:  1. Diovan 80 mg daily.  2. Lasix 20 mg q.o.d.  3. Klor-Con 20 mEq q.o.d.  4. Crestor 40 mg daily.  5. Simvastatin 80 mg daily.  6. Carvedilol 25 mg b.i.d.  7. Aspirin 325 mg daily.  8. Tylenol p.r.n.  9. Spiriva HandiHaler daily.  10.Advair daily.  11.Benadryl Allergy p.r.n.   SOCIAL HISTORY:  She lives in [place] with her husband.  She quit  tobacco in December 2007, with approximately 40-pack year history.  She  denies alcohol or drug abuse.   FAMILY HISTORY:  Her mother died at age 63 of complications from  coronary bypass surgery.  Her father died at age 63 of cancer with no  history of coronary artery disease.  She has 5 siblings without heart  disease.   REVIEW OF SYSTEMS:  She has not had fevers, chills, or sweats.  She has  chronic dyspnea on exertion that has not changed.  Her weight has not  changed.  She has mild pain at the incision site, but this is generally  well controlled.  She has had no recent GI or  other musculoskeletal  symptoms.  Full 14-point review of systems is, otherwise, negative.   PHYSICAL EXAMINATION:  VITAL SIGNS:  Temperature 98.6, blood pressure  68/55 on dopamine of 15 mcg/kg/minute and D5 half-normal saline at 75 mL  an hour, pulse 117, respiratory rate 20, O2 saturation 89% on O2 by  plain mask.  GENERAL:  She is a well-developed, well-nourished white female who is  not in acute distress.  HEENT:  Normal.  NECK:  There is no lymphadenopathy, thyromegaly, or JVD noted.  Her  incision on the right has only mild edema with a JP drain in place.  On  the left a carotid bruit is noted.  CV:  Heart is regular in rate and rhythm with an S1 and S2 and 2-3/6  systolic murmur is noted.  Distal pulses are intact in all 4  extremities.  LUNGS:  She has very few rales and her lungs are otherwise clear.  SKIN:  She has no rashes or lesions.  Her incision is without drainage  or ecchymosis.  ABDOMEN:  Soft and nontender with active bowel sounds.  EXTREMITIES:  There is no cyanosis, clubbing, or edema noted.  MUSCULOSKELETAL:  There is no joint deformity or effusions.  NEUROLOGIC:  She is slightly sleepy, but responds appropriately to  verbal stimulus with cranial nerves II through XII grossly intact.   LABORATORY DATA:  Chest x-ray shows increasing prominence of the right  mediastinum, which could be technical.  Consider followup as clinically  indicated.  Mild bibasilar atelectasis and emphysema/bullous disease are  noted.   EKG, sinus tachycardia at rate of 113 with no acute ischemic changes and  the left bundle-branch block is old.   Postoperative CBC, hemoglobin 11.5, hematocrit 34, WBC 16.8, and  platelets 240.  Preoperative BMET, sodium 139, potassium 4.4, chloride  107, CO2 of 26, BUN 12, creatinine 1.21, and glucose 97.   IMPRESSION:  Brenda [name] was seen today by Dr. Hillis Range.  She is alert  50 year old female in no acute distress with no focal findings.  Her   incision looks good.  She has sinus tachycardia, but atrial fibrillation  is not present.  There is no evidence of superior vena cava obstruction  on exam.  A limited transthoracic echo performed by Dr. Johney Frame shows  mild right ventricular underfilling and vigorous left ventricular  function,  which suggests hypovolemia.  No pericardial effusion was seen.  Her EKG is sinus tachycardia with the left bundle-branch block and no  arrhythmia other than electrical alternans is seen.  Her right  mediastinum is fuller on chest x-ray and the vascular PA is aware.  Her  symptoms are most suspicious for postoperative hypovolemia and  vasodilatation.  We, therefore, recommend resuscitative IV fluids as  well as a Neo-Synephrine drip with dopamine to be used as well p.r.n.  We will cycle cardiac enzymes and check a 2-D echocardiogram tomorrow  after her blood pressure and volume status is stabilized.  Her volume  status will need to be watched closely.  The situation was discussed  with Dr. Madilyn Fireman who was aware of the situation.  Cardiology will continue  to follow her closely as well.      Theodore Demark, PA-C      Hillis Range, MD  Electronically Signed    RB/MEDQ  D:  05/10/2008  T:  05/11/2008  Job:  [id number]

## 2010-10-22 NOTE — Op Note (Signed)
[name], [name]               ACCOUNT NO.:  [id number]   MEDICAL RECORD NO.:  [id number]          PATIENT TYPE:  INP   LOCATION:  3303                         FACILITY:  [facility]   PHYSICIAN:  Balinda Quails, M.D.    DATE OF BIRTH:  March 25, 1961   DATE OF PROCEDURE:  05/24/2007  DATE OF DISCHARGE:  05/25/2007                               OPERATIVE REPORT   SURGEON:  Denman George, MD   ASSISTANT:  Jerold Coombe, P.A.   ANESTHETIC:  General endotracheal.   PREOPERATIVE DIAGNOSIS:  Severe progressive left internal carotid artery  stenosis.   POSTOPERATIVE DIAGNOSIS:  Severe progressive left internal carotid  artery stenosis.   PROCEDURE:  Left carotid endarterectomy, Dacron patch angioplasty,   CLINICAL NOTE:  Brenda [name] is a 50 year old female with history of  nonischemic cardiomyopathy and congestive heart failure.  She has a  history of heavy tobacco use in the past, having not smoked in the past  year.  She underwent a right carotid endarterectomy earlier this year  and was followed in the CTS office with known left carotid stenosis.  This has progressed now and revealed severe increasing velocities  consistent with a high-grade stenosis.   The patient was seen and evaluated, consented to surgery, left carotid  endarterectomy for reduction stroke risk.  Risks and benefits of the  operative procedure reviewed with the patient in detail with a major  morbidity mortality 1-2% to include but not limited MI, CVA, cranial  nerve injury and death.   OPERATIVE PROCEDURE:  The patient brought to the operating room stable  condition.  Placed under general endotracheal anesthesia.  Foley  catheter was in place.   Left neck prepped and draped in sterile fashion.   Curvilinear skin incision made along the anterior border of left  sternomastoid muscle.  Dissection carried down through subcutaneous  tissue with electrocautery.  Platysma divided.  Deep dissection carried  down to expose the carotid bifurcation.  The left mid jugular lymph node  was biopsied which was incidentally present and sent for specimen.   The left carotid bifurcation exposed, severe plaque was present  extending well down into the left common carotid artery.  The incision  was extended proximally down to the head of the clavicle and the left  common carotid artery mobilized down below the level of the omohyoid  muscle.  The omohyoid belly was divided.  The vagus nerve reflected  posteriorly and preserved.  The left common carotid artery was encircled  proximally with vessel loop.   Distal dissection then carried up to the carotid bifurcation where the  superior thyroid and external carotid were freed and encircled with  vessel loops.  The internal carotid artery plaque extended distally.  The internal carotid artery exposed up to the posterior belly of the  digastric muscle.  The hypoglossal nerve reflected superiorly and  preserved.  Branching veins were ligated with 3-0 silk and divided.  Distal internal carotid artery encircled with fine vessel loop.   The patient was administered 7000 units heparin intravenously.  Adequate  circulation time permitted.  The carotid vessels controlled with clamps.  Longitudinal arteriotomy then made in the distal left common carotid  artery.  Extensive calcified and irregular ulcerated plaque was present.  The arteriotomy was extended proximally down well down into the left  common carotid artery and distally across the carotid bulb and up into  the internal carotid artery beyond the plaque.  The shunt was then  inserted.   The endarterectomy was then begun at the bifurcation and the plaque was  raised down into the common carotid artery where a long endarterectomy  was carried down to the base of the neck.  The plaque was divided  transversely with Potts scissors.  The superior thyroid external carotid  were endarterectomized using an  eversion technique and the distal  internal carotid artery plaque feathered out well.  Fragments of plaque  removed with fine forceps.  The site irrigated with heparin saline  solution.   The long arteriotomy on the left common carotid artery was then closed  with a primary running 6-0 Prolene suture.  This was closed up to the  bulb.  The internal carotid artery and bulb were then patched with a  Finesse Dacron patch using running 6-0 Prolene suture.  At completion of  the patch angioplasty shunt was removed.  All vessels well flushed.  Clamps removed directing initial antegrade flow up the external carotid  artery, following this internal carotid was released.  There was an  excellent pulse and Doppler signal in the distal internal carotid  artery.   Adequate hemostasis obtained.  Sponges counts correct.  The patient  administered 50 mg protamine intravenously.  The right and left neck was  drained with a 15 round Blake drain, exited inferiorly through the skin,  fixed to skin with 2-0 silk suture.   The sternomastoid fascia then closed running 2-0 Vicryl suture.  Platysma closed running 3-0 Vicryl suture.  Skin closed 4-0 Monocryl.  Dermabond applied.   Anesthesia was reversed in the operating room.  The patient awakened  readily.  No apparent complications.  Transferred to recovery room in  stable condition neurologically intact.      Balinda Quails, M.D.  Electronically Signed     PGH/MEDQ  D:  05/24/2007  T:  05/25/2007  Job:  [id number]   cc:   Bevelyn Buckles. Bensimhon, MD  Talmadge Coventry, M.D.

## 2010-10-22 NOTE — Procedures (Signed)
CAROTID DUPLEX EXAM   INDICATION:  Follow up carotid artery disease.   HISTORY:  Diabetes:  No  Cardiac:  CHF  Hypertension:  Yes  Smoking:  Previous  Previous Surgery:  Bilateral CEA with subclavian artery to bifurcation  bypass graft 05/10/2008 by Dr. Madilyn Fireman.  CV History:  Possible stroke in past per patient, asymptomatic now.  Amaurosis Fugax No, Paresthesias No, Hemiparesis No                                       RIGHT             LEFT  Brachial systolic pressure:         98                90  Brachial Doppler waveforms:         biphasic  monophasic/biphasic  Vertebral direction of flow:        antegrade         no flow visualized  DUPLEX VELOCITIES (cm/sec)  CCA peak systolic                   100 (bypass graft)                   153  ECA peak systolic                   126               101  ICA peak systolic                   121               134  ICA end diastolic                   40                47  PLAQUE MORPHOLOGY:                  heterogeneous     heterogeneous  PLAQUE AMOUNT:                      mild              mild  PLAQUE LOCATION:                    ICA/ECA           ICA/ECA   IMPRESSION:  1. Bilateral mid internal carotid artery velocities are suggestive of      40%-59% stenosis.  Patent right subclavian artery to carotid      bifurcation bypass graft.  2. Right vertebral artery appears antegrade, left vertebral artery      flow not visualized.   ___________________________________________  Quita Skye Hart Rochester, M.D.   AS/MEDQ  D:  10/10/2009  T:  10/10/2009  Job:  [id number]

## 2010-10-22 NOTE — Assessment & Plan Note (Signed)
OFFICE VISIT   Brenda [name], Brenda [name]  DOB:  [birth year]                                       03/30/2008  CHART#:[id number]   The patient has undergone a cerebral arteriogram since her prior visit.  This reveals a very complex picture.  She has essentially a widely  patent left carotid endarterectomy site.  Her left vertebral is  occluded.  She has an occluded right common carotid artery, however, the  bifurcation is open with retrograde right external carotid flow and  antegrade right internal carotid flow.  She also has a tight stenosis of  the right subclavian artery proximal to a diseased right vertebral  artery.  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   I have reviewed these findings with several of my partners.  The plan at  this time is to perform a right subclavian repair with patch  angioplasty, right subclavian carotid bypass and reimplantation of the  right vertebral artery.   Blood pressure at this time 138/78, pulse is 78 per minute.  Cranial  nerves intact.  Strength equal bilaterally.   I will plan to schedule surgery electively in the next upcoming several  weeks.   Balinda Quails, M.D.  Electronically Signed   PGH/MEDQ  D:  03/30/2008  T:  03/31/2008  Job:  [id number]   cc:   Pramod P. Pearlean Brownie, MD  Veverly Fells. Excell Seltzer, MD

## 2010-10-22 NOTE — Progress Notes (Signed)
HEALTHCARE                        PERIPHERAL VASCULAR OFFICE NOTE   BRIELE, LAGASSE                      MRN:          [id number]  DATE:03/08/2007                            DOB:          01/01/1961    Brenda [name] was seen in followup at the [facility] Peripheral [facility]  office on March 08, 2007.   She has been followed by Dr. Samule Ohm and I will be assuming her care from  here forward.   From a cardiac standpoint, she is followed by Dr. Gala Romney.   Brenda [name] is a 50 year old woman with nonischemic cardiomyopathy and  diffuse peripheral arterial disease who presents today for a followup  evaluation.  She has undergone right carotid endarterectomy by Dr. Madilyn Fireman  in February and is planning on left carotid endarterectomy in the next  few weeks.  She has undergone renal stenting for severe right renal  artery stenosis.  Her renal stent procedure was complicated by a distal  wire perforation which was treated with coil embolization of 2 superior  right renal artery branches.   She has recently undergone a repeat lower extremity vascular study as  well as a renal vascular duplex.  The renal duplex from August 19th,  showed significant restenosis in the right renal artery with a renal  aortic ratio of 4.3 and a peak systolic velocity in the origin of the  renal artery of 430-cm/sec.  Renal size is stable with the right kidney  length of 11.5-cm, and left kidney length of 11.1-cm.  The left renal  artery peak systolic velocity is also 400-cm/sec with a renal aortic  ratio of 4.  This is stable from her previous study.   Her lower extremity study showed moderate right CFA stenosis with severe  left CFA stenosis and a peak systolic velocity of 656-cm/sec.  Her ABI  was 0.73 on the right and 0.91 on the left.   From a symptomatic standpoint, she has bilateral calf claudication,  right worse than left.  She claudicates mostly with brisk walking.   She  is able to walk for 30 minutes at a regular pace.  Her blood pressure  control has been erratic.   CURRENT MEDICINES:  1. Lasix 20 mg daily.  2. Potassium 20 mEq daily.  3. Simvastatin 40 mg daily.  4. Aspirin 325 mg daily.  5. Carvedilol 12.5 mg twice daily.   PHYSICAL EXAMINATION:  GENERAL:  She is alert and oriented, no acute  distress.  VITAL SIGNS:  Weight is 183 pounds, blood pressure 100/76 in the right  arm, 80/68 in the left arm.  Heart rate is 74.  Respiratory rate is 16.  HEENT:  Normal.  NECK:  Normal carotid upstrokes with a prominent left carotid bruits as  well as a right subclavian bruit.  Jugular venous pressure is normal.  LUNGS:  Clear to auscultation bilaterally.  HEART:  Regular rate and rhythm without murmurs or gallops.  ABDOMEN:  Soft, nontender.  No organomegaly.  No abdominal bruits.  EXTREMITIES:  Femoral pulses are 2 plus with bilateral bruits.  There is  no clubbing, cyanosis,  or edema.  Pedal pulses are diminished.   ASSESSMENT:  1. Severe carotid arterial disease, status post right carotid      endarterectomy and upcoming left carotic endarterectomy in the next      few weeks.  2. Severe renal artery stenosis with significant restenosis of the      right renal artery.  3. Lower extremity peripheral arterial disease with severe common      femoral artery stenosis and diffuse superficial femoral artery      stenosis.   PLAN:  I am going to follow up with a renal ultrasound and basic  metabolic panel at the time of her return visit in 3 months.  I would  like to allow Brenda [name] to recover from her carotid endarterectomy prior  to considering a repeat renal artery revascularization.  I have some  reluctance in pursuing this in the setting of her previous complications  with a femoral pseudoaneurysm as well as severe left common femoral  artery stenosis.  This complicates access options.  Also, she sustained  a wire perforation during her  previous procedure.  If she develops renal  insufficiency or progressive and refractory hypertension, we will need  to intervene appropriately.     Veverly Fells. Excell Seltzer, MD  Electronically Signed    MDC/MedQ  DD: 03/14/2007  DT: 03/14/2007  Job #: [id number]   cc:   Balinda Quails, M.D.  Bevelyn Buckles. Bensimhon, MD

## 2010-10-22 NOTE — Procedures (Signed)
CAROTID DUPLEX EXAM   INDICATION:  Follow up right subclavian to bifurcation bypass with a  history of bilateral carotid endarterectomy.   HISTORY:  Diabetes:  No.  Cardiac:  No.  Hypertension:  Yes.  Smoking:  Quit.  Previous Surgery:  Bilateral carotid endarterectomy and right subclavian  to bifurcation bypass graft.  CV History:  Amaurosis Fugax No, Paresthesias No, Hemiparesis No.                                       RIGHT             LEFT  Brachial systolic pressure:         140               120  Brachial Doppler waveforms:         Biphasic          Monophasic  Vertebral direction of flow:        Antegrade         Not well  visualized  DUPLEX VELOCITIES (cm/sec)  CCA peak systolic                   99 (bypass)       147  ECA peak systolic                   122               180  ICA peak systolic                   126 (mid)         152 (mid)  ICA end diastolic                   44                57  PLAQUE MORPHOLOGY:                  Heterogenous      Heterogenous  PLAQUE AMOUNT:                      Mild              Mild  PLAQUE LOCATION:                    ICA, ECA          ICA, ECA   IMPRESSION:  1. Patent right subclavian to bifurcation bypass graft.  2. 40-59% stenosis noted in bilateral mid internal carotid arteries.  3. History of bilateral carotid endarterectomy.  4. Antegrade bilateral vertebral arteries.   ___________________________________________  Di Kindle. Edilia Bo, M.D.   MG/MEDQ  D:  04/19/2009  T:  04/19/2009  Job:  [id number]

## 2010-10-22 NOTE — Discharge Summary (Signed)
[name], [name]               ACCOUNT NO.:  [id number]   MEDICAL RECORD NO.:  [id number]          PATIENT TYPE:  INP   LOCATION:  2037                         FACILITY:  [facility]   PHYSICIAN:  Brenda [name], M.D.    DATE OF BIRTH:  [birth year]   DATE OF ADMISSION:  05/10/2008  DATE OF DISCHARGE:  05/17/2008                               DISCHARGE SUMMARY   DISCHARGE DIAGNOSES:  1. Recurrent right carotid occlusive disease with right vertebral      occlusive disease resulting in steal syndrome.  2. Nonischemic cardiomyopathy.  3. Peripheral vascular disease.  4. Nonobstructive coronary artery disease.  5. Hypertension.  6. Solitary pulmonary nodule.  7. Chronic obstructive pulmonary disease.  8. Chronic anemia.  9. Acute blood loss anemia.  10.Postoperative hypotension and bradycardia.  11.Elevated oxygen requirement postoperatively.   PROCEDURES PERFORMED:  1. Right subclavian to carotid bypass using 6-mm Hemashield graft.  2. Proximal ligation of right vertebral artery with translocation to 6-      mm Hemashield graft and decide anastomosis by Dr. Madilyn Fireman, May 10, 2008.   COMPLICATIONS:  None.   DISCHARGE MEDICATIONS:  1. Percocet 5/325 one p.o. q.4 h. p.r.n. pain, total #20 were given.  2. Lasix 20 mg p.o. daily.  3. Klor-Con 20 mEq p.o. daily.  4. Coreg 325 mg p.o. b.i.d.  5. Benicar 10 mg p.o. daily.  6. Crestor 40 mg p.o. nightly.  7. Aspirin 325 mg p.o. daily.  8. Spiriva 18 mcg 1 inhalation daily.  9. Advair 1 puff daily.  10.Benadryl allergy p.r.n.  11.Home O2 at 3 liters per minute due to room air saturation of 76%.   CONDITION AT DISCHARGE:  Stable, improving.   DISCHARGE DISPOSITION:  She has been discharged home in stable condition  with a wound healing well.  She is given careful instructions regarding  the care of her wound and her activity level.  She is to see Dr. Madilyn Fireman  in 2 weeks for followup.  The office will arrange a visit.   Brief  identifying statement with complete details, please refer the  typed history and physical.  Briefly, this very pleasant woman has  undergone a right carotid endarterectomy.  She has developed recurrent  disease.  Dr. Madilyn Fireman evaluated her and found her to have recurrent  disease with additional vertebral steal syndrome.  He recommended  subclavian to carotid bypass with reimplantation of the right vertebral  arteries.  She was informed of the risks and benefits of the procedure  and after careful consideration, elected to proceed with surgery.   HOSPITAL COURSE:  Preoperative workup was completed as an outpatient.  She was brought in through same-day surgery and underwent the  aforementioned procedure.  Complete details, please refer the typed  operative report.  The procedure was without complications.  She was  returned to the postanesthesia care unit extubated.  While in the  intensive care unit, she became hypotensive with tachycardia.  She was  at first felt to be in atrial fibrillation with rapid ventricular  response, however.  We asked  the cardiologist to evaluate her.  She was  found to be in sinus tachycardia.  This was treated with volume.  Her  pressure and rhythm did respond to this.  She was subsequently  transferred to a bed in a surgical intensive care unit.  She was  observed over the next several days.  We were eventually able to  transfer her to a bed on a surgical convalescent floor.  Postoperatively, she did have difficulties with oxygenation.  She  continued to have an elevated oxygen requirement.  We were able to wean  her to 3 liters nasal cannula by discharge.  On May 17, 2008, she  was evaluated.  She was not 3 liters per minute.  She was felt stable  and was discharged home on O2.  Hopefully, we will be able to wean the  O2 at home.  We will continue her diuresis for the next several weeks.  Arrangements were made to have her followed in followup by Dr.  Madilyn Fireman.      Wilmon Arms, PA      P. Liliane Bade, M.D.  Electronically Signed    KEL/MEDQ  D:  05/17/2008  T:  05/17/2008  Job:  [id number]

## 2010-10-25 NOTE — H&P (Signed)
NAME:  Brenda [name], Brenda [name]               ACCOUNT NO.:  [id number]   MEDICAL RECORD NO.:  [id number]          PATIENT TYPE:  EMS   LOCATION:  MAJO                         FACILITY:  [facility]   PHYSICIAN:  Theresia Bough, MD       DATE OF BIRTH:  [birth year]   DATE OF ADMISSION:  05/31/2006  DATE OF DISCHARGE:                              HISTORY & PHYSICAL   PRIMARY CARE PHYSICIAN:  The patient is not assigned.   PRESENTING COMPLAINT:  Chest pain.   HISTORY OF PRESENT ILLNESS:  This is a 50 year old female patient who  came in to [place] with the complaint of chest pain.  At the time I saw her,  the patient said the chest pain is now resolved.  She also complained of  cough.  Cough is sometimes productive.  She denies nausea, no vomiting,  and no diarrhea.  She denies headaches.  The patient denies any  abdominal pain.  Denies dysuria.  There is no feet swelling.  The  patient denies any sick contact.   PAST MEDICAL HISTORY:  The patient has a history of hypertension.  She  is not taking any blood pressure medicine.  She quit taking her blood  pressure medicines by herself.   SOCIAL HISTORY:  She lives with her husband.  She smokes about 2 packs  of cigarettes per day.  She has been smoking for several years.   DRUG ALLERGIES:  SHE HAS NO KNOWN DRUG ALLERGIES.   FAMILY HISTORY:  She has a family history of heart disease.   HOME MEDICATIONS:  The patient is not on any medications at this time.   PHYSICAL EXAMINATION:  VITALS:  Temperature of 98.0, pulse rate of 94,  respirations of 20, blood pressure of 145/95.  HEAD AND NECK:  Shows pink conjunctivae.  She has no jaundice.  Neck is  supple.  Mucous membranes are moist.  CHEST:  Clear to auscultation bilaterally.  CARDIOVASCULAR:  Shows normal heart sounds, no murmur, and no gallop.  ABDOMEN:  Soft, not tender.  No masses palpable.  The patient has normal  bowel sounds.  EXTREMITIES:  Show no edema and no cyanosis.  CENTRAL NERVOUS SYSTEMS:   The patient is alert and oriented to time,  place, and person.  She has no focal deficits.  Speech is clear.  Power  is 4/4 in all limbs.   LAB WORK:  Shows WBC of 12.4, which is slightly elevated, hemoglobin of  11.5, hematocrit of 34.8, platelets of 432.  D-dimer 1.34, which is  slightly elevated.  Chest x-ray shows chronic changes with edema.  The  patient also had possible interstitial pneumonitis.  Myoglobin level is  53.6, which is normal.; CK-MB 2.2 is normal; troponin less than 0.05 is  normal.  BNP is slightly increased at 713.  EKG shows left bundle branch  block.  There is no old EKG to compare.  I have consulted cardiology for  this left bundle branch block.   ASSESSMENT:  1. Chest pain.  2. Uncontrolled high blood pressure.  3. Query pneumoniae.   PLAN:  The  plan is to admit to telemetry bed.  I will repeat the  patient's cardiac enzymes.  I have consulted cardiology for left bundle  branch block.  I am also going to do CT angiogram to rule out PE because  of the elevated D-dimer.  I will start the patient on IV Levaquin 500 mg  q.24 hours; Lopressor 50 mg p.o. b.i.d., first dose now for high blood  pressure; nitroglycerin patch half inch to apply to skin q.6 hours to  treat her chest pain.  We will also write some nebulizer treatment,  albuterol and Atrovent q.6 hours p.r.n. shortness of breath.  The  patient should also have tobacco cessation counseling.      Theresia Bough, MD  Electronically Signed     GA/MEDQ  D:  05/31/2006  T:  06/01/2006  Job:  [id number]

## 2010-10-25 NOTE — Progress Notes (Signed)
[facility]                        PERIPHERAL VASCULAR OFFICE NOTE   Brenda [name], Brenda [name]                      MRN:          [id number]  DATE:06/23/2006                            DOB:          1960-11-28    PRIMARY CARE PHYSICIAN:  Talmadge Coventry, M.D.   PRIMARY CARDIOLOGIST:  Bevelyn Buckles. Bensimhon, MD   REASON FOR CONSULTATION:  Patient referred by Dr. Gala Romney for 95%  right renal artery stenosis.   HISTORY OF PRESENT ILLNESS:  Brenda [name] is a 50 year old lady who  presented to the hospital with heart failure and chest pain in late  December, 2007.  She was found to have a nonischemic cardiomyopathy with  an ejection fraction of 40%.  She was also found to have bilateral  carotid stenoses which are to be evaluated by Dr. Madilyn Fireman and a 95% right  renal artery stenosis.   Brenda [name] denies any prior history of kidney disease.  She had never had  a prior hospitalization for heart failure.  She denies any TIA or  stroke.  She does have bilateral calf claudication when hurrying.  She  says this is very rarely a problem.  She has never had any rest pain or  tissue loss.  She has no claudication in her usual daily life.   PAST MEDICAL HISTORY:  1. Mild nonobstructive atherosclerotic coronary artery disease by      catheterization on June 01, 2006.  2. Hypertension.  3. Nonischemic cardiomyopathy.  4. Right greater than left renal artery stenosis.  5. Asymptomatic carotid stenosis, details not available, to be      followed by Dr. Madilyn Fireman.  6. Pulmonary nodule.  7. Bullous chronic obstructive pulmonary disease.  8. Anemia with planned colonoscopy by Dr. Tama Gander.  9. History of pneumothorax at age 58.   ALLERGIES:  NKDA.   CURRENT MEDICATIONS:  1. Enteric-coated aspirin 81 mg daily.  2. Lasix 20 mg daily.  3. K-Dur 20 mEq daily.  4. Coreg 3.125 mg twice daily.  5. Simvastatin 40 mg nightly.  6. Spiriva.  7. Iron sulfate 325 mg 3  times per day.  8. Colace 100 mg twice per day.  9. Albuterol 90 mcg p.r.n.  10.Home O2 at 2 liters per nasal cannula.   SOCIAL HISTORY:  Lives in [place].  Has two children.  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  Smoked two packs per day for a number of  years but quit in December, 2007.  Denies significant alcohol use.  Denies illicit drug use.   FAMILY HISTORY:  Mother died at 47 of complications from coronary bypass  surgery.  Father died at 52 of malignancy.  Five siblings are alive and  well.  Her two children are alive and well.   REVIEW OF SYSTEMS:  Notable for bilateral hearing aids, dentures on the  top.  It otherwise negative in detail, except as above.   PHYSICAL EXAMINATION:  GENERAL:  She is generally well-appearing and  well-tanned in no distress.  VITAL SIGNS:  Heart rate 80, blood pressure 140/100 on the right and  140/100 on the  left.  Weight is 167 pounds.  HEENT:  Normal except for the poor dentition.  MUSCULOSKELETAL:  Normal.  SKIN:  Normal.  NECK:  She has no jugular venous distention, thyromegaly, or  lymphadenopathy.  LUNGS:  Clear to auscultation.  Respiratory effort is normal.  CARDIAC:  She had a nondisplaced point of maximal cardiac impulse.  There is a regular rate and rhythm without murmur, rub or gallop.  ABDOMEN:  Soft, nondistended, nontender.  There is no  hepatosplenomegaly.  Soft abdominal bruit on the right.  Normal bowel  sounds.  No pulsatile midline mass.  EXTREMITIES:  Warm without cyanosis, clubbing, edema, or ulceration.  PERIPHERAL VASCULAR:  Carotid pulses 2+ bilaterally with very prominent  bruits bilaterally.  Femoral pulses 2+ on the right and trace on the  left with a left bruit.  DP/PT pulses not palpable on either side.   IMPRESSION/RECOMMENDATIONS:  1. Carotid disease, to be followed by Dr. Madilyn Fireman.  2. Anemia.  Discussed with Dr. Christella Hartigan.  He plans colonoscopy.  Will      need to anticoagulate her for renal  revascularization.  He will      proceed to this in the near future, before any renal      revascularization.  3. Renal artery stenosis:  Very severe right renal artery stenosis.      It is unclear to me how well controlled her blood pressure is.  It      is clearly not controlled today; however, when she was seen in Dr.      Larae Grooms office a week ago, blood pressure was normal.  Nonetheless,      the lesion is clearly preocclusive.  At her young age and with the      multitude of other medical problems, I would favor being aggressive      for preservation of renal function.  4. Lower extremity vascular disease and claudication, which is only      minimally lifestyle-limiting.  We will manage conservatively with      exercise therapy.  No Pletal, given her congestive heart failure.   Will plan on scheduling renal revascularization for approximately three  weeks from now.  This will give time for the colonoscopy.     Salvadore Farber, MD  Electronically Signed    WED/MedQ  DD: 06/23/2006  DT: 06/23/2006  Job #: [id number]   cc:   Talmadge Coventry, M.D.  Bevelyn Buckles. Bensimhon, MD  P. Liliane Bade, M.D.  Rachael Fee, MD

## 2010-10-25 NOTE — Op Note (Signed)
NAME:  RAYLEY, GAO               ACCOUNT NO.:  [id number]   MEDICAL RECORD NO.:  [id number]          PATIENT TYPE:  OBV   LOCATION:  4738                         FACILITY:  [facility]   PHYSICIAN:  Salvadore Farber, MD  DATE OF BIRTH:  01-26-1961   DATE OF PROCEDURE:  07/14/2006  DATE OF DISCHARGE:                               OPERATIVE REPORT   PROCEDURE:  Stenting of the right renal artery ostium, coil embolization  of two superior right renal artery branches to treat wire perforation.,  selective right renal angiography.   INDICATIONS:  Ms. Bookwalter is a 50 year old lady who presented with heart  failure and chest pain in late December 2007.  She was found to have a  nonischemic cardiomyopathy with an ejection fraction of 40%.  She was  also found to have a 95% right renal artery stenosis.  After extensive  discussion with Ms. Switalski regarding the risks and benefits of renal  artery stenting as well as the inconclusive nature of preservation of  renal function with stenting, she strongly wished to proceed with  revascularization.   PROCEDURAL TECHNIQUE:  Informed consent was obtained as discussed above.  Under 1% lidocaine local anesthesia, a 6-French sheath was placed in the  right common femoral artery using modified Seldinger technique.  Anticoagulation was initiated with heparin to achieve and maintain an  ACT of greater than 250 seconds.  A 6-French LIMA guiding catheter was  then advanced over a wire and used to selectively engage the right renal  artery.  Angiography was performed by power injection.  This confirmed  at 90% ostial renal artery stenosis.  Renal size remained normal.  We  decided to proceed with percutaneous revascularization.   A stabilizer wire was advanced cross the stenosis and distally.  As we  were advancing it distally, it jumped past a bend in the vessel and went  further distally than planned.  We then predilated the vessel using a 5  x 15 mm Aviator  balloon at 6 atmospheres.  At this point, the patient  complained of some mild right flank pain.  Angiography raised concern  for wire perforation in the superior pole of the right kidney.  However,  it was not clear that this was not simply a calix.  I felt that we  needed to secure the angioplasty result before further investigating a  possible wire perforation since we would need to pass equipment through  the proximal segment to deal with the wire perforation.  We then quickly  stented the ostium using a 6 x 12 mm Herculink stent deployed at 12  atmospheres for 20 seconds.  I then pulled back it back to the ostium  and post dilated at 12 atmospheres again.   I then repeated angiography in multiple views.  I felt that images did  confirm a wire perforation.  Further confirming a perforation was a  pulsatility with pulsatile extrinsic compression on the superior portion  of the renal pelvis.  I then advanced a Prowater wire into what appeared  to be the affected branch vessel.  We then  advanced a 2 x 9 mm Maverick  across this and inflated it to 2 atmospheres for 30 seconds.  While we  were preparing the Maverick, we had also reversed the anticoagulation  with protamine.  We deflated the Maverick balloon but saw what appeared  to be some continued extravasation.  I then reinflated the Maverick  balloon and left it inflated while we prepared for coil embolization.   We used a Courier microcatheter and, after removing the Maverick  balloon, I quickly advanced a Courier microcatheter over the wire into  the affected branch.  I then deployed an UltiPaq 10 microcoil.  This  appeared undersized and moved distally rapidly.  I then further  embolized using a 2.5 x 30 mm Microsphere 10 microcoil.  With this, flow  appeared to slow.  I then pulled back and the flow appeared to slow in  the affected branch.   I then turned attention to a second branch that was possibly the source  of bleeding.   I advanced a Prowater wire into this and over it, the  microcatheter.  I then deployed a 2 x 25 mm Microsphere 10 coil.  Repeat  angiography demonstrated cessation of the extravasation and slow flow in  the first vessel embolized.  I then removed the guiding catheter over a  wire.  The patient was then transferred to the holding room in stable  condition.  Sheath will be removed there.  After the sheath is removed  we will proceed to CT scan to assess the intraparenchymal hemorrhage.   COMPLICATIONS:  Wire perforation with intraparenchymal right renal  hemorrhage treated with coil embolization.   FINDING:  A 90% right renal artery ostial stenosis stented to no  residual.   IMPRESSION AND PLAN:  Successful stenting of the right renal ostium  complicated by distal wire perforation.  The wire perforation was  treated with embolization of the branch vessels involved.  Will check CT  scan to assess the right renal parenchymal hematoma.  She will be  maintained on both aspirin and Plavix.  Will place a Foley catheter to  avoid clotting since hematuria is likely.  We will follow her renal  function closely.      Salvadore Farber, MD  Electronically Signed     WED/MEDQ  D:  07/14/2006  T:  07/14/2006  Job:  [id number]   cc:   Talmadge Coventry, M.D.  Bevelyn Buckles. Bensimhon, MD

## 2010-10-25 NOTE — Discharge Summary (Signed)
NAME:  Brenda [name], Brenda [name]               ACCOUNT NO.:  [id number]   MEDICAL RECORD NO.:  [id number]          PATIENT TYPE:  INP   LOCATION:  NA                           FACILITY:  [facility]   PHYSICIAN:  Balinda Quails, M.D.    DATE OF BIRTH:  06-20-1960   DATE OF ADMISSION:  DATE OF DISCHARGE:                               DISCHARGE SUMMARY   PRIMARY CARE PHYSICIAN:  Dr. Talmadge Coventry.   CARDIOLOGIST:  Dr. Arvilla Meres.   HISTORY AND PHYSICAL:  This is a 50 year old Caucasian female with  history of hypertension, coronary artery disease and chronic obstructive  pulmonary disease with recently diagnosed bilateral internal carotid  artery stenoses.  The patient was recently discharged from [facility] December of 2007 and was diagnosed with nonischemic  cardiomyopathy.  In the hospital, patient's stay was complicated by  acute renal failure with hypokalemia.  At that time, she was also  diagnosed with renal artery stenosis.  While in the hospital, the  patient underwent a carotid Doppler study, which showed greater than 80%  stenosis of the distal common carotid artery, which produces increased  velocities throughout the internal carotid artery.  The left internal  carotid artery stenosis is 60-80% stenosis.  She had bilateral ECA  stenosis.  The vertebral flow was abnormal wave form.  The right common  carotid artery was extensive, severe, regular and heterogenous plaque in  the distal segment of the vessel.   Patient saw Dr. Madilyn Fireman in consultation and he had her undergo a CT  angiogram, which revealed bilateral subclavian artery stenosis.  The  proximal left vetebral artery is occluded and there is an 80% stenosis  of the proximal right vertebral artery.  Mild stenosis of the distal  right common carotid and proximal right internal carotid artery.  She  has 75% stenosis of the proximal left internal carotid artery due  calcified plaque, which is irregular and ulcerated.   Also sclerotic  plaque is seen throughout the left common carotid artery.  It is best  felt the patient undergo a right carotid endarterectomy to reduce her  risk of stroke.   Currently, the patient remains asymptomatic.  She does complain of 1  episode during last spring, early summer, having episodes of dizziness  and feeling as though she is going to black out.  This has only happened  once and she has had no further episodes.  Currently, she denies  headaches, nausea, vomiting, vertigo, dizziness, history of falls,  seizures, numbness, muscle weakness, dysarthria, no arthralgia, visual  changes, syncope, presyncope, memory loss, confusion or TIAs/CVA  symptoms.   PAST MEDICAL HISTORY:  1. Nonischemic cardiomyopathy.  2. Acute renal failure.  3. Peripheral vascular disease with bilateral renal artery stenosis.  4. Bilateral carotid artery stenosis.  5. Pulmonary nodule.  6. Chronic obstructive pulmonary disease.  7. Congestive heart failure.  8. Coronary artery disease.  9. Hypertension.  10.Anemia.  11.History of pneumothorax at age 27.  12.Former tobacco abuse.   PAST SURGICAL HISTORY:  1. On July 16, 2006 the patient underwent a right renal artery  stent placement.  2. Tonsillectomy and adenoidectomy.  3. Cardiac catheterization December of 2007.   ALLERGIES:  NO KNOWN DRUG ALLERGIES.   MEDICATIONS:  1. Aspirin 81 mg p.o. daily.  2. Lasix 20 mg p.o. daily.  3. Potassium chloride 20 mEq p.o. daily.  4. Coreg 3.125 mg p.o. daily.  5. Zoloft 100 mg p.o. q.h.s.  6. Spiriva 1 cap daily.  7. Iron 325 mg p.o. q.i.d.  8. Colace 100 mg p.o. b.i.d.  9. Albuterol 90 mcg every 4 hours.  10.Percocet 2.5/325 mg every 6 hours p.r.n.  11.Plavix 75 mg p.o. daily.   REVIEW OF SYSTEMS:  See HPI for pertinent positives and negatives.  Otherwise, negative for diabetes mellitus, hypothyroidism, chest pain,  shortness of breath.   SOCIAL HISTORY:  The patient is married and  has 2 children.  She lives  with her husband.  The patient has smoked 2 packs a day for a while;  however, she quit in December of 2007.  She denies alcohol use.  She is  employed as a Estate manager/land agent.  She does still drive.   FAMILY HISTORY:  Mother deceased at 60 due to complications with  coronary artery bypass grafting.  The patient's fathered deceased at 88  to cancer.  The patient has 5 siblings who are alive and well.  She has  2 children that are alive and well.   PHYSICAL EXAMINATION:  VITAL SIGNS:  Blood pressure 112/80.  Heart rate  60.  Respirations 16.  GENERAL:  This is a 50 year old Caucasian female in no acute distress.  HEENT:  Normocephalic, atraumatic.  Pupils equal, round and reactive to  light and accommodation.  Extraocular movements intact.  Oral mucosa is  pink and moist.  Sclerae anicteric.  No dentures.  NECK:  Supple.  Palpable carotids.  She has carotid bruits that are  present bilaterally on auscultation.  RESPIRATORY:  Symmetrical on inspiration, unlabored and clear to  auscultation bilaterally.  CARDIAC:  Regular rate and rhythm.  No murmur, gallop or rub.  ABDOMEN:  Soft, nontender, nondistended.  Normoactive bowel sounds x4.  GU/RECTAL:  Deferred.  EXTREMITIES:  No edema.  Lower extremities warm.  She has 2+ pulses  bilaterally and throughout.  NEUROLOGIC:  Nonfocal.  Alert and oriented x4.  Gait is steady.  Muscle  strength 5+ bilaterally and throughout.  Deep tendon reflexes 2+ and  symmetrical.   ASSESSMENT:  1. Right extracranial cerebral vascular occlusive disease.  2. Right internal carotid artery stenosis, asymptomatic.   PLAN:  1. We will admit the patient to [facility] on July 31, 2006 under Dr. Madilyn Fireman' service.  2. The patient will undergo a right carotid endarterectomy.  3. The risks and benefits were explained to the patient in great     detail.  Dr. Madilyn Fireman has seen and evaluated the patient prior to      admission  and is in agreeance with the above.      Constance Holster, PA      P. Liliane Bade, M.D.  Electronically Signed    JMW/MEDQ  D:  07/29/2006  T:  07/30/2006  Job:  [id number]   cc:   Talmadge Coventry, M.D.  Bevelyn Buckles. Bensimhon, MD

## 2010-10-25 NOTE — Progress Notes (Signed)
[facility]                        PERIPHERAL VASCULAR OFFICE NOTE   Peru, PAONE                      MRN:          [id number]  DATE:07/30/2006                            DOB:          02-03-1961    PRIMARY CARE PHYSICIAN:  Dr. Talmadge Coventry.   HISTORY OF PRESENT ILLNESS:  Brenda [name] is a 50 year old woman with  nonischemic cardiomyopathy and peripheral vascular disease, as well as  cerebrovascular disease.  On July 14, 2006 I stented her 95% stenosis  of the right renal artery.  That procedure was complicated by a wire  perforation which was treated with coil and possession of a renal artery  branch.  Renal function has remained stable, with a creatinine of 1.2.  Procedure was also complicated by a right common femoral arterial pseudo-  aneurysm which was treated with Thrombin injection.  Repeat Duplex today  shows complete resolution of the pseudo-aneurysm, though there is still  a large hematoma.  She is due for elective carotid endarterectomy by Dr.  Madilyn Fireman tomorrow.   CURRENT MEDICATIONS:  1. Lasix 20 mg daily.  2. K-Dur 20 mEq daily.  3. Coreg 3.125 mg twice per day.  4. Simvastatin 40 mg per day.  5. Spiriva 1 tablet per day.  6. Iron sulfate 325 mg twice daily.  7. Colace 100 mg twice daily.  8. Aspirin 325 mg daily.  She had been taking Plavix but stopped it yesterday on the instruction  of Dr. Madilyn Fireman' office.   PHYSICAL EXAMINATION:  She is generally well appearing and in no  distress, with heart rate 82, blood pressure 123/88 and equal  bilaterally.  Weight is 165 pounds.  NECK:  She has no jugular venous distention, thyromegaly, or  lymphadenopathy.  LUNGS:  Clear to auscultation.  CARDIOVASCULAR:  She has a nondisplaced point of maximal cardiac  impulse.  There is a regular rate and rhythm, without murmurs, rubs or  gallops.  ABDOMEN:  Soft, nontender, nondistended.  There is no  hepatosplenomegaly, and no  pulsatile midline mass.  There is no  abdominal bruit.  In the right groin, there is a large hematoma without  associated bruit.   IMPRESSION/RECOMMENDATIONS:  Doing well after renal artery stenting.  Creatinine 1.2 on February 14.  Due for renal Duplex in 2 weeks.  We  will schedule this.  She will then have a renal Duplex again in 6  months.     Salvadore Farber, MD  Electronically Signed    WED/MedQ  DD: 07/30/2006  DT: 07/30/2006  Job #: [id number]   cc:   Talmadge Coventry, M.D.  Bevelyn Buckles. Bensimhon, MD  P. Liliane Bade, M.D.

## 2010-10-25 NOTE — Progress Notes (Signed)
[facility]                        PERIPHERAL VASCULAR OFFICE NOTE   MAYTAL, MIJANGOS                      MRN:          [id number]  DATE:08/17/2007                            DOB:          02/16/1961    Brenda [name] was seen back at the [facility] peripheral vascular office on  August 17, 2007.  Brenda [name] is a 47-year woman with lower extremity  peripheral arterial disease, renal artery stenosis status post renal  stenting, and carotid stenosis, status post bilateral carotid  endarterectomies by Dr. Madilyn Fireman.  She also has nonischemic cardiomyopathy  and is followed by Dr. Gala Romney.   From a symptomatic standpoint, Brenda [name] has been doing quite well  recently.  She has been engaged in regular physical exercise and has  made a real effort at taking good care of herself.  She just walked this  morning for 25 minutes on the treadmill without stopping.  She does  complain of right calf claudication but it is not severely limiting at  this point.  She has no symptoms of the left leg.  She also has mild  pain in the right hip and thigh.  She currently is doing daily exercise  either on the treadmill or a stationary bike.   She has recovered well from her carotid surgeries and has no other  complaints today.   CURRENT MEDICATIONS:  Include Lasix 20 mg every other day, potassium  chloride 20 mEq every other day, aspirin 325 mg daily, carvedilol 12.5  mg twice daily, simvastatin 80 mg daily, lisinopril 10 mg daily.   ALLERGIES:  NKDA.   PHYSICAL EXAMINATION:  On exam she is alert and oriented.  No acute  distress.  Blood pressure is 90/70, heart rate 76, weight 184,  respiratory rate 16.  HEENT:  Normal.  NECK:  Normal carotid upstrokes with well-healed endarterectomy scars.  Bilateral carotid bruits are present.  Jugular venous pressure is  normal.  LUNGS:  Clear bilaterally.  HEART:  Regular rate and rhythm with a 2/6 systolic ejection murmur at  the right upper sternal border.  ABDOMEN:  Soft, nontender, no organomegaly.  No abdominal bruits.  EXTREMITIES:  No clubbing, cyanosis or edema.  Femoral pulses are 2+  with a right femoral bruit.  Pedal pulses are 2+ and equal.   ASSESSMENT:  1. Lower extremity peripheral arterial disease.  In August of last      year ABI was 0.73 on the right and 0.91 on the left.  The accuracy      of her ABIs is questionable because she has upper extremity      arterial disease as well.  She had diffusely elevated velocities on      the right without focal stenoses, and on the left there were      severely elevated velocities in the common femoral artery.      Otherwise there were no high-grade stenoses seen.  She is      responding well to medical therapy and does not require      revascularization at this time.  Her functional capacity is quite  good.  2. Renal artery stenosis.  She is status post right renal artery stent      for severe stenosis.  Last renal duplex was in August of 2008 and      demonstrated severely elevated velocities over the origin of the      right renal artery at 431 cm/second.  The left renal artery also      had severely elevated velocities at 401 cm/second.  Labs were drawn      today.  Her renal function has normalized with a creatinine of 1.2.      It is really difficult to know how her blood pressure is running      because she has bilateral upper extremity arterial disease and her      blood pressures are not accurate.  We will repeat a renal duplex      again and review the results prior to determining how to proceed      with medical therapy versus renal revascularization.  Clinically      she is doing quite well and unless there is significant progression      I am inclined to manage her medically at present.  3. Carotid arterial disease.  Followed by Dr. Madilyn Fireman.   FOLLOWUP:  For followup will be in touch with Brenda [name] when her renal  ultrasound results are  available and otherwise will plan on seeing her  back in 6 months.     Veverly Fells. Excell Seltzer, MD  Electronically Signed    MDC/MedQ  DD: 08/19/2007  DT: 08/20/2007  Job #: [id number]   cc:   Balinda Quails, M.D.  Talmadge Coventry, M.D.  Bevelyn Buckles. Bensimhon, MD

## 2010-10-25 NOTE — Discharge Summary (Signed)
NAMECORYNNE, Brenda [name]               ACCOUNT NO.:  [id number]   MEDICAL RECORD NO.:  [id number]          PATIENT TYPE:  INP   LOCATION:  3710                         FACILITY:  [facility]   PHYSICIAN:  Marcellus Scott, MD     DATE OF BIRTH:  07-Feb-1961   DATE OF ADMISSION:  05/31/2006  DATE OF DISCHARGE:  06/05/2006                               DISCHARGE SUMMARY   PRIMARY CARE PHYSICIAN:  Talmadge Coventry, M.D.   CARDIOLOGIST:  Bevelyn Buckles. Bensimhon, MD   DISCHARGE DIAGNOSES:  1. Congestive heart failure/non-ischemic cardiomyopathy.  2. Shock.  3. Acute blood loss anemia.  4. Hypokalemia.  5. Acute renal failure.  6. Peripheral arterial disease with bilateral renal artery stenosis,      right greater than left.  7. Carotid stenosis.  8. Pulmonary nodule.  9. Acute bronchitis on chronic obstructive pulmonary disease.   DISCHARGE MEDICATIONS:  1. Enteric coated aspirin 81 mg p.o. daily.  2. Lasix 20 mg p.o. daily.  3. Potassium chloride 20 meq p.o. daily.  4. Coreg 3.125 mg p.o. twice daily.  5. Simvastatin 40 mg p.o. nightly.  6. Spiriva one capsule inhaled daily.  7. Ferrous Sulfate 325 mg p.o. t.i.d.  8. Colace 100 mg p.o. b.i.d.  9. Albuterol 90 mcg per spray inhaled q. 4-6 hourly p.r.n.  10.Percocet 2.5/325 one to two tablets p.o. q. 6 hours p.r.n.   PROCEDURES DONE:  1. June 04, 2006. CT of the abdomen and pelvis without contrast.      Impression of CT of the abdomen is interval development of  mild      fullness of both intrarenal collection systems.  Interstitial      pulmonary edema with bibasilar dependent atelectasis.  Small      bilateral pleural effusions.  Stable pulmonary nodule at the right      lung base.  Follow-up imaging is recommended.  The pelvic CT      without contrast impression is bladder distention, likely accounts      for the mild fullness in both intrarenal collecting systems. No      evidence of extraperitoneal or retroperitoneal hematoma.  2. June 03, 2006.  Portable chest x-ray.  Impression:  3. Increased interstitial and mild air space capacities, suspect      infection.  4. Increase in left basilar atelectasis.  5. COPD.  6. June 01, 2006.  CT of the abdomen with contrast.  Impression is      small bilateral pleural effusions and basilar atelectasis.      Bilateral basilar interstitial lung disease.  Tiny right lung base      nodule, possibly 3 mm in diameter, stable since 07/28/05.  Contrast      retention with the kidney, particularly right, cannot exclude      underlying or contrast induced nephropathy.   CT of the pelvis.  Impression is minimal stranding surrounding the right  external iliac vessels compatible with prior cardiac catheterizations.  No significant retroperitoneal hematoma or hemorrhage.  Please see CT  abdomen report confirming appearance of this.  1. June 01, 2006.  Portable  chest x-ray.  Impression:  COPD with      mild CHF, minimally improved.  2. May 31, 2006.  CT angiogram of the chest.  Impression:  3. No evidence of acute pulmonary embolism.  4. CHF and small bilateral pleural effusions.  Underlying pulmonary      emphysema.  5. Mild hilar and mediastinal adenopathy, likely due to lymphedema.  6. Abberent origin of right subclavian artery incidentally noted.  7. May 31, 2006.  Portable chest x-ray.  Impression:  Underlying      chronic lung changes, COPD.  Probable underlying interstitial      pneumonitis.  8. June 01, 2006.  Transthoracic echocardiogram.  Summary:  LV      size was upper limits of normal.  Overall LV systolic function      mildly decreased.  Left ventricular ejection fraction estimated to      be 40%.  There was mild diffuse left ventricular hypokinesis.  Left      ventricular wall thickness was mildly increased.  Left atrium was      mildly dilated.  9. June 01, 2006.  Cardiac catheterization.  Please refer to the      procedure note for  further details.  The impression was:  10.Mild to moderate non-ischemic cardiomyopathy with ejection fraction      of 35 to 40%.  11.Mild non-obstructive coronary artery disease.  12.Significant peripheral artery disease with bilateral renal artery      stenosis, right greater than left and moderate caliectasis.  13.Mild aortic stenosis.  14.June 03, 2006.  Carotid Doppler.  Preliminary report shows      bilateral greater than 80% internal carotid artery stenosis.   CONSULTATIONS OBTAINED:  1. Cardiology from Dr. Bevelyn Buckles. Bensimhon of [facility].  2. CVTS.   HOSPITAL COURSE AND CONDITION OF PATIENT ON DISCHARGE:  For details of  the initial part of the admission, please refer to the history and  physical note done by Dr. Hadley Pen on May 31, 2006.  In summary, the  patient is a 50 year old female patient with history of hypertension,  noncompliance with medication, smoker, who presented to the Emergency  Room with chest pain.  She was admitted to the telemetry bed to rule out  acute coronary syndrome and for uncontrolled hypertension and  questionable pneumonia.  Her cardiac enzymes were cycled.  Cardiology  consult was obtained for the left bundle branch block.  The patient had  a CT angiogram because of an elevated D dimer and pulmonary embolism was  ruled out.  1. Congestive heart failure/nonischemic cardiomyopathy.  As indicated      above, the patient was admitted to the telemetry floor.  Cardiology      was consulted who proceeded to do a cardiac catheterization as      indicated because of high risk of coronary artery disease.      Findings of the catheter results are as above.  Following the      catheterization, the patient had a period of shock for which she      was transferred to the Intensive Care Unit, had to be placed on      Dopamine. For suspicion of retroperitoneal hematoma, the patient     had a CT of the abdomen and pelvis which were negative.  Also  the      patient had a drop in her hematocrit which was followed closely but      stabilized and the patient did  not have to be transfused.  The      patient's nitro paste , beta blockers and ACE inhibitors were held.      The patient gradually did well and was transferred out of the      Intensive Care Unit to the telemetry floor.  The patient on the      27th complained of some back pain and dropped her hematocrit to      8.4. However on rechecking the hematocrit it was still over 9 so      transfusion was not done.  CT of the abdomen and pelvis without      contrast were repeated and were negative for any bleed.  The      patient has been followed up by cardiology and they are okay for      the patient to be discharged and followed up as an outpatient.  The      patient is to continue her Lasix as well as potassium supplements.      The patient on the 27th of December complained of some dyspnea.      She was noted to have congestive heart failure with crackles in the      lungs which was felt to be multifactorial secondary to CHF as well      as COPD.  Her BNP was elevated in the 750s range.  The patient was      given Lasix and has done well.  Today's BNP is 433.  The patient is      asymptomatic at rest with lungs clear.  2. Shock.  As indicated above, post cardiac catheterization the      patient went into a period of shock for unclear reasons at this      time and had to be on pressors which were weaned off.  The patient      has done well and has been off pressors for more than 36 hours,      maintaining good blood pressures and she is ready for discharge.  3. Acute blood loss anemia, the source of which has remained a      mystery.  However the patient's H&H has remained stable and she had      not required any transfusions.  There is a question of iron      deficiency anemia and the patient will go home on iron supplements.  4. Hypokalemia.  This has been repeatedly repleted  with potassium      supplements.  Today potassium is 3.4.  She is to continue oral      potassium maintenance as an outpatient and basic metabolic panel      will have to be followed up as an outpatient.  5. Acute renal failure.  The patient had a brief period of acute renal      insufficiency with a creatinine in the 1.6 and then 1.4 which was      attributed secondary to contrast with cardiac catheterization which      with hydration improved and her creatinine today is 1.  6. Peripheral artery disease and renal artery stenosis, right greater      than the left.  Findings as above.  This is to be followed up as an      outpatient by the primary care physician and further work-up and      management to be done as deemed necessary.  7. Carotid artery stenosis.  The patient has  been evaluated in the      hospital by CVTS who will follow-up in the office after discharge. 8. Pulmonary nodule.  This was an incidental finding on the CAT scan.      This is to be followed up as an outpatient as deemed necessary.  9. Acute bronchitis on chronic obstructive pulmonary disease.  The      patient has completed a six day course of Avalox.  The patient will      be discharged on no antibiotic, however she is to continue Spiriva      at maintenance for COPD as well as Albuterol inhaler prn and she is      going home on oxygen because of the saturation of 87% on ambulation      today.  This to be followed up with her primary doctor.   I have been regularly updating her medical management with the patient  as well as her daughters.   Today's lab details:  Hemoglobin 9.3, hematocrit 28, white blood cell  count 9.6, platelet 370.  Sodium 135, potassium 3.4, chloride 101,  bicarb 24, BUN 10, creatinine 1, glucose 88, calcium 8.8.   She is to be seen as an outpatient by the cardiologist, by CVTS and her  primary care physician.      Marcellus Scott, MD  Electronically Signed     AH/MEDQ  D:   06/05/2006  T:  06/05/2006  Job:  [id number]   cc:   Talmadge Coventry, M.D.  Bevelyn Buckles. Bensimhon, MD

## 2010-10-25 NOTE — Consult Note (Signed)
NAMEBRIAWNA, CARVER               ACCOUNT NO.:  [id number]   MEDICAL RECORD NO.:  [id number]          PATIENT TYPE:  INP   LOCATION:  1844                         FACILITY:  [facility]   PHYSICIAN:  Bevelyn Buckles. Bensimhon, MDDATE OF BIRTH:  02-20-1961   DATE OF CONSULTATION:  05/31/2006  DATE OF DISCHARGE:                                 CONSULTATION   PRIMARY CARE PHYSICIAN:  Talmadge Coventry, M.D.   CHIEF COMPLAINT:  Chest pain and elevated BNP and left bundle branch  block.   HISTORY:  Ms. Lennox is a 50 year old woman with a history of bullous  emphysema and ongoing tobacco use, as well as hypertension and a strong  family history of coronary heart disease.  She denies any known history  of cardiac disease.  Apparently last week she had an episode of  transient chest pain, which she did not pay much attention to.  She has  chronic dyspnea.  Today, she was shopping when she had onset of  progressive dyspnea also associated with some chest pain.  There is no  diaphoresis, nausea, vomiting or radiation.  She was brought to the ER  and subsequently became pain free.  A chest x-ray showed COPD with  probable congestive heart failure.  BNP was elevated at 713.  EKG showed  a left bundle branch block of unknown duration.  She has never had a  previous EKG.  D-dimer was elevated at 1.34.  Cardiac markers were  negative.  Troponin was 0.06.  She was evaluated by the hospitalist  service, and we are asked to consult.   REVIEW OF SYSTEMS:  She notes chronic shortness of breath and has  recently had some mild hemoptysis.  Denies any lower extremity edema,  orthopnea or PND plus fatigue.  No bright red blood per rectum.  No  melena.  No fevers, chills, nausea or vomiting.  No syncope or  presyncope noted.  The remainder of the review of systems is negative  except for HPI and problem list.   PROBLEM LIST:  1. Bullous emphysema with previous pneumothorax requiring chest tube.  2.  Hypertension.  3. Denies diabetes.  Lipid status is unknown.   HOME MEDICATIONS:  None.   ALLERGIES:  None.   SOCIAL HISTORY:  She lives in [place].  She has 2 kids.  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  Has a history of tobacco at 2 packs per day  times many years, ongoing.  Denies significant alcohol intake.   FAMILY HISTORY:  Mother died at age 47 during complications from bypass  surgery.   PHYSICAL EXAMINATION:  GENERAL:  She looks older than her stated age.  She has a flattened affect, but is in no acute distress.  Respirations  are unlabored.  VITAL SIGNS:  Temperature is 98.2, blood pressure 154/89, heart rate  108.  She is satting 99% on 2 liters.  HEENT:  Sclerae are anicteric.  Extraocular movements intact.  There are  no xanthelasmas.  Mucous membranes are moist.  Oropharynx is clear.  NECK:  Supple.  There is no obvious JVD.  Carotids  are 2+ bilaterally  with bilateral radiated bruits.  CARDIAC:  She has distant heart sounds.  She is tachycardic and regular.  There is a 2/6 systolic ejection murmur with his mid peaking at the  right sternal border.  S2 is mildly depressed, but still audible.  There  is no rub or gallop.  LUNGS:  Very poor air movement throughout with no audible wheezing or  rales.  ABDOMEN:  Soft, nontender, nondistended.  There is no  hepatosplenomegaly, no bruits, no masses appreciated.  EXTREMITIES:  Warm with no clubbing, cyanosis, or edema.  Dorsalis pedis  pulses are 1+ bilaterally.  There are no rashes or arthropathies.  NEUROLOGIC:  She has a flat affect.  Cranial nerves II-XII are intact.  Moves all 4 extremities without difficulty.  She has poor eye contact.   LABORATORY DATA:  Troponin of 0.06.  CK-MB is 2.2.  CBC reveals white  count of 12.4, hemoglobin 11.5, platelets 432.  D-dimer is elevated at  1.34.  BNP is 713.  Creatinine is 0.8.  Glucose of 104.   ELECTROCARDIOGRAM:  EKG shows sinus tachycardia with a left bundle  branch  block of unknown duration.  There are no previous EKGs.   CT SCAN OF CHEST:  Bullous emphysema with no obvious PE by my read.  There are some small bilateral pleural effusions, but no obvious heart  failure.  Final interpretation is pending.   ASSESSMENT:  1. Chest pain, atypical.  2. Elevated BNP.  3. Apparent aortic stenosis by exam.  4. Bullous emphysema with history of pneumothorax.   DISCUSSION/PLAN:  Her chest pain is somewhat atypical.  However, on  physical exam, I am concerned about aortic stenosis.  She also has  multiple cardiac risk factors, as well as severe bullous lung disease.  At this point, we will plan to start with an echocardiogram and cycle in  cardiac markers.  Should she have elevated cardiac markers or abnormal  echocardiogram, I would have a low threshold to proceed with cardiac  catheterization.  Otherwise, could consider a functional study.  We  appreciate the consult and will continue to follow with you.      Bevelyn Buckles. Bensimhon, MD  Electronically Signed     DRB/MEDQ  D:  05/31/2006  T:  06/01/2006  Job:  [id number]

## 2010-10-25 NOTE — Discharge Summary (Signed)
Brenda [name], Brenda [name]               ACCOUNT NO.:  [id number]   MEDICAL RECORD NO.:  [id number]          PATIENT TYPE:  INP   LOCATION:  4738                         FACILITY:  [facility]   PHYSICIAN:  Salvadore Farber, MD  DATE OF BIRTH:  1960-07-14   DATE OF ADMISSION:  07/14/2006  DATE OF DISCHARGE:  07/16/2006                               DISCHARGE SUMMARY   ADDENDUM DISCHARGE SUMMARY   Please add to the discharge medications, Percocet 5/325 mg 1 tab p.o.  q.6 hours p.r.n. groin pain.      [name], ANP      Salvadore Farber, MD  Electronically Signed    CB/MEDQ  D:  07/16/2006  T:  07/17/2006  Job:  [id number]

## 2010-10-25 NOTE — Op Note (Signed)
[facility]  Patient:    Brenda [name], Brenda [name] Visit Number: [id number] MRN: [id number]          Service Type: DSU Location: [facility] Attending Physician:  Marcelle Overlie Dictated by:   Marcelle Overlie, M.D. Admit Date:  05/31/2001                             Operative Report  PREOPERATIVE DIAGNOSIS:       Missed abortion.  POSTOPERATIVE DIAGNOSIS:      Missed abortion.  PROCEDURE:                    Dilation and evacuation.  SURGEON:                      Marcelle Overlie, M.D.  ANESTHESIA:                   MAC with paracervical block.  ESTIMATED BLOOD LOSS:         Less than 50 cc.  DESCRIPTION OF PROCEDURE:     The patient was taken to the operating room. She was then given IV sedation and placed in the lithotomy position.  The vagina and vulva were prepped and draped in the usual sterile fashion. In-and-out catheter was used to empty the bladder.  A speculum was inserted in the vagina.  The cervix was grasped with a tenaculum.  The uterus was sounded to 8 cm.  It was anteverted.  Paracervical block was performed at 5 and 7 oclock.  The cervical internal os was gently dilated using Pratt dilators. A #7 suction cannula was inserted into the uterus and suction curettage was performed x 2 with retrieval of products consistent with products of conception.  The suction cannula was removed and sharp curettage was performed.  There was a thorough curettage where was no tissue retrieved. There was no bleeding at the end of the procedure.  All sponge, lap and instrument counts were correct x 2.  The patient tolerated the procedure well and returned to the recovery room in stable condition.  PATHOLOGY:                    Uterine curettings. Dictated by:   Marcelle Overlie, M.D. Attending Physician:  Marcelle Overlie DD:  05/31/01 TD:  06/01/01 Job: [id number]

## 2010-10-25 NOTE — Cardiovascular Report (Signed)
NAMEBLANCHE, SCOVELL               ACCOUNT NO.:  [id number]   MEDICAL RECORD NO.:  [id number]          PATIENT TYPE:  INP   LOCATION:  3316                         FACILITY:  [facility]   PHYSICIAN:  Bevelyn Buckles. Bensimhon, MDDATE OF BIRTH:  07-29-1960   DATE OF PROCEDURE:  06/01/2006  DATE OF DISCHARGE:                            CARDIAC CATHETERIZATION   PRIMARY CARE PHYSICIAN:  Dr. Talmadge Coventry.   CARDIOLOGIST:  Dr. Gala Romney.   PATIENT IDENTIFICATION:  Ms. Checo is a 50 year old woman with a history  of COPD and ongoing tobacco use as well as hypertension.  She was  admitted with chest pain and shortness of breath and a new right bundle  branch block.  Chest x-ray was suggested congestive heart failure.  A  troponin was mildly elevated at 0.12 and she was brought to the  catheterization lab for diagnostic angiography.   PROCEDURES PERFORMED:  1. Right heart cath.  2. Left heart cath.  3. Left ventriculogram.  4. Selective coronary angiography.  5. Abdominal aortogram.   DESCRIPTION OF PROCEDURE:  The risks and benefits of catheterization  were explained.  Consent was signed and placed on the chart.  A 5-French  arterial sheath was placed in the right femoral artery using a modified  Seldinger technique.  Standard catheters including JL-4, JR-4 and angled  pigtail were used for the catheterization.  Of note, the JR-4 catheter  selectively intubated a large conus branch and thus a 5-French no-torque  catheter was used to image the right coronary artery proper.  All  catheter exchanges were made over wire.  The were no apparent  complications.  A 7-French venous sheath was placed in the right femoral  vein and a standard right heart cath was performed with a Swan-Ganz  catheter.   HEMODYNAMIC RESULTS:  Right atrial pressure mean of 5, RV 41/0, PA 23/10  with a mean of 15.  Pulmonary capillary wedge pressure mean of 11.  Central aortic pressure 137/63 with a mean of 89.  LV  pressure 154/0  with an EDP of 15.  On pullback across the aortic valve, there was a  mean gradient of 17.  The aortic valve area is 1.31 cm2 consistent with  mild aortic stenosis.  There was no gradient on simultaneous LV and  wedge pressure tracing suggestive of no mitral stenosis.  Fick cardiac  output was 3.9 liters per minute.  Cardiac index was 2.2 liters per  minute per meter squared.  Pulmonary vascular resistance was 1.0 Woods  units.   Left main was short with no significant disease.  LAD was a very long  vessel wrapping the apex.  It gave off two large diagonals.  There was a  40% lesion in the mid-LAD.   Left circumflex was a large vessel that gave off a large OM1 which  bifurcated at the ostium.  There were two large branches.  There was a  40% stenosis in the proximal portion of the upper branch.  In the mid  left circumflex, there was a 40% lesion and the distal left circumflex  gave off a very  large posterolateral and a small posterolateral.   Right coronary artery was a moderate-sized dominant vessel that gave off  a large PDA and two small posterolaterals.  There was also a very large  conus branch.  In the mid right coronary, there was a 30-40% tubular  lesion.   Left ventriculogram done in the RAO projection showed an EF of  approximately 35-40% with global hypokinesis.  No obvious mitral  regurgitation.   Abdominal aortogram showed a 95% proximal right renal artery stenosis  and a 60-70% left renal artery stenosis.  There was moderate abdominal  aortic plaquing with evidence of a probable 60% right common iliac  stenosis and 30-40% on the left common iliac.   ASSESSMENT:  1. Mild to moderate nonischemic cardiomyopathy as described above with      ejection fraction approximately 35-40%.  2. Mild nonobstructive coronary artery disease.  3. Significant peripheral arterial disease with bilateral renal      arteries stenosis, right greater than left and moderate  iliac      disease.  4. Mild aortic stenosis.   PLAN/DISCUSSION:  Mrs. Apel will need medical therapy for her  nonischemic cardiomyopathy including an ACE inhibitor and beta blocker.  She will also need to follow up with Dr. Samule Ohm for consult regarding  her peripheral arterial disease and possible renal artery stenting and  evaluation of her claudication.  We will also have her carotid  ultrasound as an outpatient.  Hopefully, she will be stable for  discharge home in the morning and can follow-up in the heart failure  clinic.      Bevelyn Buckles. Bensimhon, MD  Electronically Signed     DRB/MEDQ  D:  06/01/2006  T:  06/02/2006  Job:  [id number]   cc:   Talmadge Coventry, M.D.

## 2010-10-25 NOTE — Discharge Summary (Signed)
Brenda [name], Brenda [name]               ACCOUNT NO.:  [id number]   MEDICAL RECORD NO.:  [id number]          PATIENT TYPE:  INP   LOCATION:  4738                         FACILITY:  [facility]   PHYSICIAN:  Salvadore Farber, MD  DATE OF BIRTH:  Sep 15, 1960   DATE OF ADMISSION:  07/14/2006  DATE OF DISCHARGE:  07/16/2006                               DISCHARGE SUMMARY   PRIMARY CARDIOLOGIST:  Dr. Arvilla Meres.   PRIMARY CARE PHYSICIAN:  Dr. Talmadge Coventry.   PRIMARY PERIPHERAL VASCULAR CARDIOLOGIST:  Dr. Samule Ohm.   PRINCIPAL DIAGNOSIS:  Right renal artery stenosis.   SECONDARY DIAGNOSES:  1. Nonischemic cardiomyopathy, ejection fraction of 40%.  2. Peripheral vascular disease, bilateral carotid stenosis, followed      by Dr. Madilyn Fireman.  3. Nonobstructive coronary artery disease by catheterization June 01, 2006.  4. Hypertension.  5. History of pulmonary nodule.  6. Bolus chronic obstructive pulmonary disease.  7. Anemia with planned colonoscopy with Dr. Christella Hartigan.  8. History of pneumothorax at age 53.  68. Right femoral artery pseudoaneurysm.   ALLERGIES:  NO KNOWN DRUG ALLERGIES.   PROCEDURES:  1. Selective renal angiography with successful PTA and stenting of the      right renal artery with placement of a 6.0 x 12 mm HERCULINK Plus      Biliary Stent.  Coiling of wire perforation x3.  2. CT of the abdomen and pelvic.  3. Right groin ultrasound.  4. Right groin pseudoaneurysm thrombin injection.   HISTORY OF PRESENT ILLNESS:  [name] with prior  history of nonischemic cardiomyopathy and nonobstructive coronary artery  disease, as well as peripheral vascular disease with bilateral carotid  stenoses and bilateral right greater than left renal artery stenosis,  who was recently seen by Dr. Samule Ohm in clinic June 23, 2006, for  followup of renal artery stenosis and decision was made to pursue renal  angiography and revascularization.   HOSPITAL COURSE:   Patient presented to [facility] on February 5  for renal angiography and PTA and stenting of the right renal artery  with successful placement of a bare metal stent as outlined above.  The  case was complicated by wire perforation coiling, requiring coil  embolization with the use of 3 micro-coils.  Overall, the patient  tolerated this procedure well and was taken to radiology for CT of the  abdomen and pelvis to further evaluate the right kidney and this showed  no evidence of perinephric/retroperitoneal hemorrhage.  On February 6,  she was noted on exam to have a right groin bruit and an ultrasound was  performed revealing a right groin pseudoaneurysm superior to become  femoral artery.  Dr. Samule Ohm then performed a thrombin injection of the  right groin pseudoaneurysm with the use of ultrasound.  There was  incomplete thrombosis of the pseudoaneurysm and this was followed up  with ultrasound and felt to be stable on February 7.  Her renal function  has remained stable throughout this hospitalization.  Plan is for  discharge today and we will have her follow up with a  BMET in 1 week to  reevaluate renal function.  Despite her history of nonischemic  cardiomyopathy, we have not initiated ACE inhibitor therapy at this  point secondary to concern for her renal function following this renal  artery procedure and its complications.  She will follow up with a right  groin ultrasound on February 21 at 10 a.m. and then with Dr. Samule Ohm, on  the same day, at 11:45.   DISCHARGE LABS:  Hemoglobin 11.6, hematocrit 35.7, WBC 10.1, platelets  334, sodium 137, potassium 3.8, chloride 105, CO2 25, BUN 15, creatinine  1.0, glucose 90, total bilirubin 0.4, alkaline phosphatase 82, AST 17,  ALT 12, total protein 6.7, albumin 3.2, calcium 9.2.  CK 33, MB 0.5,  troponin-I 0.01.  BNP 71.0.   DISPOSITION:  Patient is being discharged home today in good condition.   FOLLOWUP PLANS AND APPOINTMENT:   As above, patient will follow up with a  BMET in 1 week.  She has a followup scheduled for a right groin  ultrasound on February 21 at 10 a.m. and subsequent to that will see Dr.  Samule Ohm on the same day at 11:45 a.m.   DISCHARGE MEDICATIONS:  1. Aspirin 325 mg daily.  2. Plavix 75 mg daily x30 days.  3. Lasix 20 mg daily.  4. K-Dur 20 mEq daily.  5. Coreg 3.125 mg b.i.d.  6. Zocor 40 mg q.p.m.  7. Spiriva 18 mcg daily.  8. Ferrous sulfate 325 mg b.i.d.  9. Colace 100 mg b.i.d.   OUTSTANDING LAB STUDIES:  None.   DURATION OF DISCHARGE ENCOUNTER:  Forty-five minutes, including  physician time.      [name], ANP      Salvadore Farber, MD  Electronically Signed    CB/MEDQ  D:  07/16/2006  T:  07/17/2006  Job:  [id number]   cc:   Talmadge Coventry, M.D.

## 2010-10-25 NOTE — Op Note (Signed)
Brenda [name], Brenda [name]               ACCOUNT NO.:  [id number]   MEDICAL RECORD NO.:  [id number]          PATIENT TYPE:  INP   LOCATION:  2550                         FACILITY:  [facility]   PHYSICIAN:  Balinda Quails, M.D.    DATE OF BIRTH:  06/28/1960   DATE OF PROCEDURE:  07/31/2006  DATE OF DISCHARGE:                               OPERATIVE REPORT   SURGEON:  Denman George, M.D.   ASSISTANT:  Coral Ceo, P.A.   ANESTHETIC:  General endotracheal.   ANESTHESIOLOGIST:  Guadalupe Maple, M.D.   PREOPERATIVE DIAGNOSIS:  Is severe right internal carotid artery  stenosis.   POSTOPERATIVE DIAGNOSIS:  Is severe right internal carotid artery  stenosis.   PROCEDURE:  Right carotid endarterectomy Dacron patch angioplasty.   CLINICAL NOTE:  Brenda [name] is a 50 year old female with a history of  heavy tobacco use.  Recently admitted to the hospital with a  cardiomyopathy and volume overload.  She was noted have carotid bruits  bilaterally.  Workup revealed evidence of moderate to severe bilateral  internal carotid artery stenosis.  Tandem lesions noted in the right  common carotid and internal carotid arteries.  She is brought to the  operating at this time for elective right carotid endarterectomy.   PROCEDURE NOTE:  The patient was brought to the operating room in stable  hemodynamic condition.  Placed under general endotracheal anesthesia.  Foley catheter and arterial line in place.  Right neck prepped and  draped in sterile fashion.   Curvilinear skin incision made along the anterior border of the right  sternomastoid muscle.  Dissection carried down through subcutaneous  tissue with electrocautery.  Platysma incised.  Deep dissection carried  down to expose the facial vein which was ligated with 2-0 silk.  The  carotid bifurcation exposed.  Common carotid artery mobilized down to  the omohyoid muscle and encircled with vessel loop.  The origin of the  superior thyroid and  external carotid were freed and encircled with  vessel loops.  The internal carotid artery then dissected and followed  distally up to the posterior belly of the digastric muscle and encircled  with vessel loop.  The hypoglossal nerve identified and preserved.  Vagus nerve reflected posteriorly and preserved.   The patient administered 7000 units of heparin intravenously.  Adequate  circulation time permitted.  The carotid vessels controlled with clamps.  Longitudinal arteriotomy made in the distal common carotid artery.  The  arteriotomy extended across carotid bulb up into the internal carotid  artery.  There were two areas of stenosis, one in the distal common  carotid artery and the other in the bulb.  These were 80% proximally.  There was no thrombus present.   A shunt inserted.  The endarterectomy carried out with an elevator.  The  plaque raised down in the common carotid artery where the plaque was  divided transversely with Potts scissors.  Plaque then raised up into  the bulb where the superior thyroid and external carotid were  endarterectomized using an eversion technique.  The distal internal  carotid artery plaque feathered out  well.  Fragments of plaque removed  with fine forceps.  The site irrigated with heparin and saline solution.   A patch angioplasty of the endarterectomy site carried out with running  6-0 Prolene suture and a Finesse Dacron patch.  At completion of the  patch angioplasty, the shunt was removed.  All vessels well flushed.  Clamps removed directing initial antegrade flow up the external carotid  artery; following this, the internal carotid was released.   Excellent pulse and Doppler signal in distal internal carotid artery.  The patient administered 50 mg of protamine intravenously.  Hemostasis  aided with Gelfoam.   The sternomastoid fascia closed with running 2-0 Vicryl suture.  Platysma closed with running 3-0 Vicryl suture.  Skin closed with  4-0  Monocryl.  Steri-Strips applied.   The patient tolerated this procedure well.  Transferred to recovery room  in stable condition.  No apparent complications.  Neurologically intact.      Balinda Quails, M.D.  Electronically Signed     PGH/MEDQ  D:  07/31/2006  T:  07/31/2006  Job:  [id number]   cc:   Bevelyn Buckles. Bensimhon, MD  Talmadge Coventry, M.D.

## 2010-10-25 NOTE — Assessment & Plan Note (Signed)
[facility]                         GASTROENTEROLOGY OFFICE NOTE   KINETA, FUDALA                      MRN:          [id number]  DATE:06/16/2006                            DOB:          [birth year]    New GI consultation.   REASON FOR REFERRAL:  Dr. Soule Mince asked me to evaluate Ms. Ahr in  consultation regarding recent anemia.   HISTORY OF PRESENT ILLNESS:  Ms. Pola is a very pleasant 50 year old  woman who was recently admitted to [facility] for chest pain.  She  was eventually discovered to have CHF with a nonischemic cardiomyopathy.  Shortly after a cardiac angiogram she developed hypotensive episode.  There was concern that she was having intraperitoneal bleed and so CT  scan was done that did not confirm any retroperitoneal or  intraperitoneal hematoma.  She did have a drop in her hemoglobin.  Specifically, her admission hemoglobin was 12.9, but shortly after her  angiogram her hemoglobin was found to be 8.4.  Her discharge hemoglobin  was 9.6.  She had no overt GI bleeding while hospitalized.  I do not see  any reports that she was Hemoccult positive either.   Since leaving the hospital she has been on iron supplements twice a day,  as well as her slough of cardiac medicines.  She has still had no overt  GI bleeding.   Two to three years ago, she had an episode of what sounds like bloody  diarrhea, this lasted for approximately 2 days.  She was told this was  probably an infection and it was indeed self-limited.  She has had no  overt GI bleeding since then.   PAST MEDICAL HISTORY:  Coronary artery disease.  CHF, elevated  cholesterol, arthritis, hypertension, kidney stones, renal artery  stenosis (scheduled to meet with Dr. Samule Ohm to discuss this in 1 to 2  weeks).   CURRENT MEDICATIONS:  1. Aspirin.  2. Lasix.  3. Potassium.  4. Coreg.  5. Simvastatin.  6. Spiriva.  7. Ferrous sulfate 325 twice daily.  8.  Colace.   ALLERGIES:  No known drug allergies.   SOCIAL HISTORY:  Married with 2 daughters.  Nonsmoker, nondrinker.   FAMILY HISTORY:  No colon cancer or colon polyps in family.   PHYSICAL EXAMINATION:  She is 5 foot 5 inches, 161 pounds.  Blood  pressure 110/76, pulse 70.  CONSTITUTION:  Generally well-appearing, neurologic, alert and oriented  x3.  EYES:  Extraocular movements intact.  MOUTH:  Oropharynx moist, no lesions.  NECK:  Supple, no lymphadenopathy.  CARDIOVASCULAR:  Heart regular rate and rhythm.  LUNGS:  Clear to auscultation bilaterally.  ABDOMEN:  Soft, nontender, nondistended.  Normal bowel sounds.  EXTREMITIES:  No lower extremity edema.  SKIN:  No rashes or lesions on visible extremities.   ASSESSMENT AND PLAN:  A 50 year old woman with recent cardiac event,  known peripheral vascular disease, anemia while hospitalized.   She has had no overt gastrointestinal bleeding and I do not see that she  was Hemoccult positive while hospitalized.  Her hemoglobin did however  drop while she was hospitalized  from 12.9 down to the low 8s.  This was  following a hypotensive episode and since she had no overt  gastrointestinal bleeding and a CT scan showed no internal abdominal  bleeding, I suspect that some of her anemia is delusional from fluids  that she most likely got while she was hypotensive.  She does need a  full colonoscopy since she had overt gastrointestinal bleeding 2 to 3  years ago and that has not been evaluated since then.  I would prefer to  wait another month or so, since she has had some acute cardiac events,  even in the past 2 weeks.  I do not see that she has had true acute  myocardial infarction, but chronic heart failure was just diagnosed.  She also has I believe a newly diagnosed renal artery stenosis, so that  will need to be evaluated as well.  She is already scheduled to see Dr.  Samule Ohm.  I will therefore get a basic set of labs, including a CBC  and a  complete metabolic profile today.  She will continue on iron twice daily  and she will return to see me in 4 to 6 weeks time.  If she is feeling  well at that point, then we will arrange for her to have colonoscopy at  her soonest convenience.     Rachael Fee, MD  Electronically Signed    DPJ/MedQ  DD: 06/16/2006  DT: 06/16/2006  Job #: [id number]   cc:   Talmadge Coventry, M.D.

## 2010-10-31 ENCOUNTER — Ambulatory Visit: Payer: Self-pay

## 2010-10-31 ENCOUNTER — Other Ambulatory Visit: Payer: Self-pay

## 2010-11-01 ENCOUNTER — Other Ambulatory Visit (INDEPENDENT_AMBULATORY_CARE_PROVIDER_SITE_OTHER): Payer: PRIVATE HEALTH INSURANCE

## 2010-11-01 ENCOUNTER — Ambulatory Visit (INDEPENDENT_AMBULATORY_CARE_PROVIDER_SITE_OTHER): Payer: PRIVATE HEALTH INSURANCE

## 2010-11-01 DIAGNOSIS — I6529 Occlusion and stenosis of unspecified carotid artery: Secondary | ICD-10-CM

## 2010-11-01 DIAGNOSIS — Z48812 Encounter for surgical aftercare following surgery on the circulatory system: Secondary | ICD-10-CM

## 2010-11-01 NOTE — Assessment & Plan Note (Signed)
OFFICE VISIT  Brenda [name], Brenda [name] DOB:  07-02-1960                                       11/01/2010 CHART#:[id number]  This is a 50 year old woman with a history of bilateral carotid disease. On 05/10/2008 had a right common carotid artery occlusion and high grade right vertebral artery stenosis and had a right subclavian carotid bypass on the right vertebral transposition by Dr. Madilyn Fireman, December 2009. The patient has done well without any symptoms.  She states that they sometimes have problems getting a blood pressure in the right arm.  She is also a patient of Dr. Earmon Phoenix and had bilateral iliac stents placed in August 2011 and bilateral renal stents on the right and left in August 2008.  She had a left renal stent in 2009 as well.  She otherwise has been doing well and follows up today for her carotid duplex scan which showed bilateral patent CEAs with no hemodynamically significant stenosis, patent right subclavian artery to carotid bifurcation bypass graft.  Medications reviewed today.  She takes Diovan 80 mg daily, Klor-Con 20 mg every other day, Lasix 20 mg every other day, baby aspirin 81 mg daily, Plavix 75 mg daily, and carvedilol 6.25 mg twice daily.  She has no known drug allergies.  PHYSICAL EXAMINATION:  This is a moderately overweight woman in no acute distress.  Her wounds are well healed on her neck.  She has no facial droop.  No tongue deviation.  She has good and equal strength in bilateral upper and lower extremities.  Wounds were healing well.  Her bilateral upper extremities were warm and pink with palpable pulses. Her systolic blood pressure was 98 on the right and 90 on the left.  Her sats were 98%, and respiratory rate was 15.  ASSESSMENT/PLAN:  Bilateral patent carotid endarterectomies with no hemodynamically significant stenosis and patent right subclavian artery to carotid bifurcation bypass graft.  Plan is to follow up per  routine for her carotid duplex scan.  Della Goo, PA-C  Fransisco Hertz, MD Electronically Signed  RR/MEDQ  D:  11/01/2010  T:  11/01/2010  Job:  [id number]

## 2010-11-06 NOTE — Procedures (Unsigned)
CAROTID DUPLEX EXAM  INDICATION:  Followup carotid artery disease.  HISTORY: Diabetes:  No. Cardiac:  Congestive heart failure. Hypertension:  Yes. Smoking:  Previous. Previous Surgery:  Bilateral carotid endarterectomies with subclavian artery to the bifurcation bypass graft 05/10/2008 by Dr. Madilyn Fireman. CV History:  The patient is currently asymptomatic. Amaurosis Fugax No, Paresthesias No, Hemiparesis No                                      RIGHT             LEFT Brachial systolic pressure:         98                90 Brachial Doppler waveforms:         Biphasic          Monophasic Vertebral direction of flow:        Antegrade         Atypical antegrade DUPLEX VELOCITIES (cm/sec) CCA peak systolic                   67 (bypass graft) 122 ECA peak systolic                   101               151 ICA peak systolic                   101               95 ICA end diastolic                   31                35 PLAQUE MORPHOLOGY:                  Heterogeneous     Heterogeneous PLAQUE AMOUNT:                      Mild              Mild PLAQUE LOCATION:                    ICA, ECA          CCA  IMPRESSION:  Bilateral patent carotid endarterectomies with no hemodynamically significant stenosis.  Patent right subclavian artery to the carotid bifurcation bypass graft.  The left vertebral is atypical antegrade, absent end diastolic velocity suggestive of stenosis proximally.  Intimal thickening within the left common carotid mild to moderate.  Study stable compared to previous.  ___________________________________________ Quita Skye Hart [place], M.D.  OD/MEDQ  D:  11/01/2010  T:  11/01/2010  Job:  [id number]

## 2010-11-29 ENCOUNTER — Other Ambulatory Visit: Payer: Self-pay | Admitting: Cardiovascular Disease

## 2010-12-13 ENCOUNTER — Encounter: Payer: PRIVATE HEALTH INSURANCE | Admitting: Cardiology

## 2010-12-13 ENCOUNTER — Telehealth: Payer: Self-pay | Admitting: Cardiovascular Disease

## 2010-12-13 ENCOUNTER — Telehealth: Payer: Self-pay | Admitting: *Deleted

## 2010-12-13 ENCOUNTER — Encounter (INDEPENDENT_AMBULATORY_CARE_PROVIDER_SITE_OTHER): Payer: PRIVATE HEALTH INSURANCE | Admitting: Cardiology

## 2010-12-13 DIAGNOSIS — I701 Atherosclerosis of renal artery: Secondary | ICD-10-CM

## 2010-12-13 DIAGNOSIS — I70219 Atherosclerosis of native arteries of extremities with intermittent claudication, unspecified extremity: Secondary | ICD-10-CM

## 2010-12-13 DIAGNOSIS — I10 Essential (primary) hypertension: Secondary | ICD-10-CM

## 2010-12-13 NOTE — Telephone Encounter (Signed)
Walk In Pt Form " Pt Leaves on Trip Thursday need Medication for Leg Pain" sent to Message Nurse 12/13/10/km

## 2010-12-13 NOTE — Telephone Encounter (Signed)
Returning call back to pam.

## 2010-12-13 NOTE — Telephone Encounter (Signed)
Received a walk-in note from front desk.  Pt states she is going on a trip and will be in a van for 24 hours (leaving Thursday).  She wants to know if we can prescribe her a medication for her legs as she has "blood flow disease".  Called and left message for pt to call back to discuss her needs.

## 2010-12-13 NOTE — Telephone Encounter (Signed)
Spoke with pt who is requested pain medication because she is going on a trip and knows her legs will be hurting.  Advised pt there is no pain medication we can give to her to prevent leg pain that she may have.  Instructed pt and stressed that she get out of the vehicle and walk around for 5 to 10 mins at least once a hour during her travels not only to help with leg pain but to prevent formation of DVT.  She stated understanding and that she would just take tylenol if need be.

## 2010-12-14 ENCOUNTER — Other Ambulatory Visit: Payer: Self-pay | Admitting: Internal Medicine

## 2010-12-16 ENCOUNTER — Encounter: Payer: Self-pay | Admitting: Cardiovascular Disease

## 2010-12-19 ENCOUNTER — Telehealth: Payer: Self-pay | Admitting: *Deleted

## 2010-12-19 NOTE — Telephone Encounter (Signed)
Spoke with pt and gave her PV test results and Dr. Earmon Phoenix recommendation to schedule sooner appointment than planned follow up for October if having increased symptoms.  She states she is not having increased pain or difficulty ambulating. Does have pain when walking longer distances but this is not new and has not increased.  She is aware to call to schedule appointment if symptoms increase.

## 2010-12-19 NOTE — Telephone Encounter (Signed)
Called pt to give her results of PV studies. Left message to call back.

## 2011-01-14 DIAGNOSIS — I6529 Occlusion and stenosis of unspecified carotid artery: Secondary | ICD-10-CM

## 2011-03-04 LAB — CBC
HCT: 33.3 — ABNORMAL LOW
Platelets: 281
RBC: 3.6 — ABNORMAL LOW
WBC: 8.6

## 2011-03-04 LAB — BASIC METABOLIC PANEL
BUN: 12
GFR calc Af Amer: >60
GFR calc non Af Amer: >60
Potassium: 3.9
Sodium: 142

## 2011-03-10 LAB — POCT I-STAT, CHEM 8
Calcium, Ion: 1.09 — ABNORMAL LOW
Glucose, Bld: 99
HCT: 44
Hemoglobin: 15
Potassium: 4.5

## 2011-03-10 LAB — CBC
Hemoglobin: 13.6
Platelets: 308
RDW: 14.7

## 2011-03-10 LAB — POCT I-STAT 4, (NA,K, GLUC, HGB,HCT)
Glucose, Bld: 100 — ABNORMAL HIGH
HCT: 45

## 2011-03-12 ENCOUNTER — Ambulatory Visit (INDEPENDENT_AMBULATORY_CARE_PROVIDER_SITE_OTHER): Payer: PRIVATE HEALTH INSURANCE | Admitting: Cardiovascular Disease

## 2011-03-12 ENCOUNTER — Encounter: Payer: Self-pay | Admitting: Cardiovascular Disease

## 2011-03-12 DIAGNOSIS — I739 Peripheral vascular disease, unspecified: Secondary | ICD-10-CM

## 2011-03-12 DIAGNOSIS — I701 Atherosclerosis of renal artery: Secondary | ICD-10-CM

## 2011-03-12 DIAGNOSIS — I70219 Atherosclerosis of native arteries of extremities with intermittent claudication, unspecified extremity: Secondary | ICD-10-CM

## 2011-03-12 DIAGNOSIS — E7800 Pure hypercholesterolemia, unspecified: Secondary | ICD-10-CM

## 2011-03-12 DIAGNOSIS — I5022 Chronic systolic (congestive) heart failure: Secondary | ICD-10-CM

## 2011-03-12 NOTE — Progress Notes (Signed)
HPI:  50 year-old woman presenting for followup of peripheral arterial disease. She has extensive diffuse disease and is s/p multiple revascularization procedures. These include carotid bypass, repeated renal stenting procedures, and bilateral iliac stenting. She has occluded SFA's bilaterally. The patient has developed progressive leg claudication once again. Her pain is in the calves and is described as a cramping pain with ambulation. The right leg is affected more than the left. She denies rest pain or ulceration. She has been less active and notes weight gain. She denies chest pain, shortness of breath, or edema. Arterial waveforms at the ankles showed lower amplitude flow than in past, ABI's are meaningless because of bilateral subclavian disease.  Outpatient Encounter Prescriptions as of 03/12/2011  Medication Sig Dispense Refill  . aspirin 325 MG tablet Take 325 mg by mouth daily.        Marland Kitchen azelastine (ASTEPRO) 137 MCG/SPRAY nasal spray 1 spray by Nasal route 2 (two) times daily. Use in each nostril as directed       . carvedilol (COREG) 6.25 MG tablet Take 1 tablet (6.25 mg total) by mouth 2 (two) times daily with a meal.  60 tablet  11  . DIOVAN 80 MG tablet TAKE ONE TABLET BY MOUTH EVERY DAY  30 each  11  . diphenhydramine-acetaminophen (TYLENOL PM) 25-500 MG TABS Take 1 tablet by mouth at bedtime as needed.        . furosemide (LASIX) 20 MG tablet Take 20 mg by mouth every other day.       Marland Kitchen KLOR-CON M20 20 MEQ tablet TAKE ONE TABLET BY MOUTH EVERY OTHER DAY  30 each  11  . PLAVIX 75 MG tablet TAKE ONE TABLET BY MOUTH EVERY DAY  30 each  11  . simvastatin (ZOCOR) 80 MG tablet TAKE ONE TABLET BY MOUTH EVERY DAY  30 tablet  7    Allergies  Allergen Reactions  . Lisinopril     REACTION: D/C DUE TO COUGH/MC  . Oxycodone     Past Medical History  Diagnosis Date  . HTN (hypertension)   . CAD (coronary artery disease)   . Carotid artery occlusion   . Renal artery stenosis   . PVD  (peripheral vascular disease)   . Hypercholesteremia   . Cardiomyopathy   . COPD (chronic obstructive pulmonary disease)   . Pulmonary nodule   . Anemia     ROS: Negative except as per HPI  BP 106/79  Pulse 71  Ht 5\' 5"  (1.651 m)  Wt 217 lb (98.431 kg)  BMI 36.11 kg/m2  PHYSICAL EXAM: Pt is alert and oriented, overweight woman in NAD HEENT: normal Neck: JVP - normal, carotids 2+= with bilateral bruits Lungs: CTA bilaterally CV: RRR without murmur or gallop Abd: soft, NT, Positive BS, no hepatomegaly Ext: no C/C/E, brachial pulses 1+=, radial pulses 1+=, femoral pulses faint bilaterally, PT 1+ on the right and trace on the left, DP nonpalpable Skin: warm/dry no rash  EKG: NSR with LBBB 71 bpm, no change from prior tracing  ASSESSMENT AND PLAN:

## 2011-03-12 NOTE — Assessment & Plan Note (Signed)
Appears stable. She is s/p treatment with a covered stent for in-stent restenosis. She is due for a repeat BMET - renal fxn has been normal in the past.

## 2011-03-12 NOTE — Patient Instructions (Signed)
Follow up in 6 months with Dr Excell Seltzer.  You will receive a letter in the mail 2 months before you are due.  Please call us when you receive this letter to schedule your follow up appointment.  Please have fasting lab work in 6 months  Your physician has requested that you have an ankle brachial index (ABI). During this test an ultrasound and blood pressure cuff are used to evaluate the arteries that supply the arms and legs with blood. Allow thirty minutes for this exam. There are no restrictions or special instructions.  Your physician has requested that you have an  Aorta/iliac duplex. During this test, an ultrasound is used to evaluate the aorta. Allow 30 minutes for this exam. Do not eat after midnight the day before and avoid carbonated beverages

## 2011-03-12 NOTE — Assessment & Plan Note (Signed)
Leg claudication worse but not a major change. She has been living with this for a long time and is not eager for another procedure. Without signs of critical ischemia, OK to wait and I encouraged walking, weight loss, continued med Rx. She will follow-up in 6 months and notify us if symptoms worsen significantly.

## 2011-03-14 LAB — BASIC METABOLIC PANEL
BUN: 10 mg/dL (ref 6–23)
BUN: 10 mg/dL (ref 6–23)
BUN: 8 mg/dL (ref 6–23)
CO2: 24 mEq/L (ref 19–32)
CO2: 29 mEq/L (ref 19–32)
CO2: 30 mEq/L (ref 19–32)
CO2: 35 mEq/L — ABNORMAL HIGH (ref 19–32)
Calcium: 8.5 mg/dL (ref 8.4–10.5)
Calcium: 9 mg/dL (ref 8.4–10.5)
Calcium: 9.2 mg/dL (ref 8.4–10.5)
Calcium: 9.1
Chloride: 100 mEq/L (ref 96–112)
Chloride: 104 mEq/L (ref 96–112)
Chloride: 98 mEq/L (ref 96–112)
Creatinine, Ser: 1.02 mg/dL (ref 0.4–1.2)
Creatinine, Ser: 1.06 mg/dL (ref 0.4–1.2)
Creatinine, Ser: 1.08 mg/dL (ref 0.4–1.2)
GFR calc Af Amer: >60 mL/min (ref >60)
GFR calc Af Amer: >60 mL/min (ref >60)
GFR calc Af Amer: >60 mL/min (ref >60)
GFR calc Af Amer: >60
GFR calc non Af Amer: 51 mL/min — ABNORMAL LOW (ref >60)
GFR calc non Af Amer: 54 — ABNORMAL LOW
Glucose, Bld: 100 mg/dL — ABNORMAL HIGH (ref 70–99)
Glucose, Bld: 104 mg/dL — ABNORMAL HIGH (ref 70–99)
Glucose, Bld: 99 mg/dL (ref 70–99)
Potassium: 3.5 mEq/L (ref 3.5–5.1)
Potassium: 4.4
Sodium: 134 mEq/L — ABNORMAL LOW (ref 135–145)
Sodium: 140 mEq/L (ref 135–145)
Sodium: 138

## 2011-03-14 LAB — CBC
HCT: 28.7 % — ABNORMAL LOW (ref 36.0–46.0)
HCT: 31.2 % — ABNORMAL LOW (ref 36.0–46.0)
HCT: 34 % — ABNORMAL LOW (ref 36.0–46.0)
HCT: 34.1 % — ABNORMAL LOW (ref 36.0–46.0)
HCT: 35.8 % — ABNORMAL LOW (ref 36.0–46.0)
HCT: 31.5 — ABNORMAL LOW
Hemoglobin: 10.3 g/dL — ABNORMAL LOW (ref 12.0–15.0)
Hemoglobin: 11.4 g/dL — ABNORMAL LOW (ref 12.0–15.0)
Hemoglobin: 11.5 g/dL — ABNORMAL LOW (ref 12.0–15.0)
Hemoglobin: 13.6 g/dL (ref 12.0–15.0)
Hemoglobin: 9.6 g/dL — ABNORMAL LOW (ref 12.0–15.0)
Hemoglobin: 10.6 — ABNORMAL LOW
MCHC: 33 g/dL (ref 30.0–36.0)
MCHC: 33.1 g/dL (ref 30.0–36.0)
MCHC: 33.5 g/dL (ref 30.0–36.0)
MCHC: 33.5 g/dL (ref 30.0–36.0)
MCHC: 34 g/dL (ref 30.0–36.0)
MCV: 95 fL (ref 78.0–100.0)
MCV: 95.3 fL (ref 78.0–100.0)
MCV: 96.1 fL (ref 78.0–100.0)
Platelets: 240 10*3/uL (ref 150–400)
Platelets: 272 10*3/uL (ref 150–400)
Platelets: 338 10*3/uL (ref 150–400)
RBC: 3.25 MIL/uL — ABNORMAL LOW (ref 3.87–5.11)
RBC: 3.59 MIL/uL — ABNORMAL LOW (ref 3.87–5.11)
RBC: 3.76 MIL/uL — ABNORMAL LOW (ref 3.87–5.11)
RBC: 4.32 MIL/uL (ref 3.87–5.11)
RDW: 14.1 % (ref 11.5–15.5)
RDW: 14.3 % (ref 11.5–15.5)
RDW: 14.9 % (ref 11.5–15.5)
RDW: 13.6
WBC: 10.8 10*3/uL — ABNORMAL HIGH (ref 4.0–10.5)
WBC: 15.8 10*3/uL — ABNORMAL HIGH (ref 4.0–10.5)
WBC: 17.1 10*3/uL — ABNORMAL HIGH (ref 4.0–10.5)
WBC: 11.5 — ABNORMAL HIGH

## 2011-03-14 LAB — URINALYSIS, ROUTINE W REFLEX MICROSCOPIC
Nitrite: NEGATIVE
Specific Gravity, Urine: 1.012 (ref 1.005–1.030)
Urobilinogen, UA: 0.2 mg/dL (ref 0.0–1.0)
pH: 5.5 (ref 5.0–8.0)

## 2011-03-14 LAB — GLUCOSE, CAPILLARY: Glucose-Capillary: 106 mg/dL — ABNORMAL HIGH (ref 70–99)

## 2011-03-14 LAB — APTT: aPTT: 33 seconds (ref 24–37)

## 2011-03-14 LAB — CARDIAC PANEL(CRET KIN+CKTOT+MB+TROPI)
Relative Index: 3 — ABNORMAL HIGH (ref 0.0–2.5)
Total CK: 113 U/L (ref 7–177)
Troponin I: 0.01 ng/mL (ref 0.00–0.06)

## 2011-03-14 LAB — COMPREHENSIVE METABOLIC PANEL
ALT: 21 U/L (ref 0–35)
Alkaline Phosphatase: 99 U/L (ref 39–117)
CO2: 26 mEq/L (ref 19–32)
Chloride: 107 mEq/L (ref 96–112)
GFR calc non Af Amer: 48 mL/min — ABNORMAL LOW (ref >60)
Glucose, Bld: 97 mg/dL (ref 70–99)
Potassium: 4.4 mEq/L (ref 3.5–5.1)
Sodium: 139 mEq/L (ref 135–145)

## 2011-03-14 LAB — HCG, SERUM, QUALITATIVE: Preg, Serum: NEGATIVE

## 2011-03-14 LAB — TYPE AND SCREEN: Antibody Screen: NEGATIVE

## 2011-03-17 LAB — CBC
HCT: 36.8
Hemoglobin: 12.3
Platelets: 297
RDW: 14.2
WBC: 11.5 — ABNORMAL HIGH

## 2011-03-17 LAB — COMPREHENSIVE METABOLIC PANEL
ALT: 19
Albumin: 3.9
Alkaline Phosphatase: 109
BUN: 18
Chloride: 104
Glucose, Bld: 103 — ABNORMAL HIGH
Potassium: 5
Sodium: 138
Total Bilirubin: 0.7
Total Protein: 7.6

## 2011-03-17 LAB — URINALYSIS, ROUTINE W REFLEX MICROSCOPIC
Glucose, UA: NEGATIVE
Hgb urine dipstick: NEGATIVE
Specific Gravity, Urine: 1.013
pH: 5.5

## 2011-03-17 LAB — TYPE AND SCREEN: Antibody Screen: NEGATIVE

## 2011-03-17 LAB — URINE MICROSCOPIC-ADD ON

## 2011-03-17 LAB — PROTIME-INR: Prothrombin Time: 13.4

## 2011-03-17 NOTE — Progress Notes (Signed)
Addended by: Sharin Grave on: 03/17/2011 04:11 PM   Modules accepted: Orders

## 2011-03-18 LAB — COMPREHENSIVE METABOLIC PANEL
ALT: 21
AST: 18
Albumin: 3.6
CO2: 29
Calcium: 9.4
Chloride: 107
Creatinine, Ser: 1.27 — ABNORMAL HIGH
GFR calc Af Amer: 55 — ABNORMAL LOW
GFR calc non Af Amer: 46 — ABNORMAL LOW
Sodium: 141
Total Bilirubin: 0.8

## 2011-03-18 LAB — URINE CULTURE

## 2011-03-18 LAB — URINALYSIS, ROUTINE W REFLEX MICROSCOPIC
Glucose, UA: NEGATIVE
Nitrite: NEGATIVE
Specific Gravity, Urine: 1.01
pH: 5

## 2011-03-18 LAB — URINE MICROSCOPIC-ADD ON

## 2011-03-18 LAB — POCT HEMOGLOBIN-HEMACUE
Hemoglobin: 13.4
Operator id: 268271

## 2011-03-18 LAB — APTT: aPTT: 32

## 2011-03-18 LAB — CBC
MCV: 92
Platelets: 305
RBC: 3.9
WBC: 8.1

## 2011-03-19 LAB — DIFFERENTIAL
Basophils Relative: 0
Eosinophils Relative: 0
Lymphocytes Relative: 11 — ABNORMAL LOW
Lymphocytes Relative: 14
Lymphs Abs: 1.8
Lymphs Abs: 1.8
Monocytes Relative: 9
Neutro Abs: 13 — ABNORMAL HIGH
Neutro Abs: 9.9 — ABNORMAL HIGH
Neutrophils Relative %: 80 — ABNORMAL HIGH

## 2011-03-19 LAB — I-STAT 8, (EC8 V) (CONVERTED LAB)
Chloride: 105
Glucose, Bld: 110 — ABNORMAL HIGH
Hemoglobin: 12.6
Potassium: 3.4 — ABNORMAL LOW
Sodium: 136
TCO2: 22

## 2011-03-19 LAB — URINALYSIS, ROUTINE W REFLEX MICROSCOPIC
Nitrite: POSITIVE — AB
Specific Gravity, Urine: 1.045 — ABNORMAL HIGH
Urobilinogen, UA: 1

## 2011-03-19 LAB — CBC
Hemoglobin: 11.6 — ABNORMAL LOW
Hemoglobin: 11.8 — ABNORMAL LOW
MCHC: 33.6
MCV: 92.6
RBC: 3.74 — ABNORMAL LOW
RBC: 3.8 — ABNORMAL LOW
WBC: 16.2 — ABNORMAL HIGH

## 2011-03-19 LAB — BASIC METABOLIC PANEL
CO2: 26
CO2: 26
Chloride: 107
Chloride: 110
GFR calc Af Amer: >60
Glucose, Bld: 97
Potassium: 4.2
Sodium: 139
Sodium: 141

## 2011-03-19 LAB — CARDIAC PANEL(CRET KIN+CKTOT+MB+TROPI)
CK, MB: 0.8
CK, MB: 1.2
Relative Index: INVALID
Relative Index: INVALID
Relative Index: INVALID
Total CK: 45
Total CK: 53
Troponin I: 0.03

## 2011-03-19 LAB — CULTURE, BLOOD (ROUTINE X 2): Culture: NO GROWTH

## 2011-03-19 LAB — URINE MICROSCOPIC-ADD ON

## 2011-03-19 LAB — POCT I-STAT CREATININE: Operator id: 146091

## 2011-03-19 LAB — GENTAMICIN LEVEL, RANDOM: Gentamicin Rm: 13.9

## 2011-03-19 LAB — HEPATIC FUNCTION PANEL
ALT: 15
AST: 13
Albumin: 2.9 — ABNORMAL LOW
Alkaline Phosphatase: 92
Total Bilirubin: 0.7

## 2011-03-19 LAB — POCT CARDIAC MARKERS: CKMB, poc: <1 — ABNORMAL LOW

## 2011-03-26 ENCOUNTER — Telehealth: Payer: Self-pay | Admitting: Cardiovascular Disease

## 2011-03-26 NOTE — Telephone Encounter (Signed)
Pt has not been feeling good since Friday and yesterday she felt like she was going to faint and had diarrhea and headache and she took her b/p and it was low

## 2011-03-26 NOTE — Telephone Encounter (Signed)
I spoke with the pt and she has not felt well since Friday (BP 109/78).  The pt did develop diarrhea yesterday and while in [employer] she checked her BP and it was 110/66, the pt felt like she was going to pass out. I reviewed her BP from last OV and it was 106/79.  I made the pt aware that her BP readings are okay and that she may be dehydrated.  The pt said she does feel better today.  I instructed the pt to drink extra water today and continue to monitor her BP.  The pt can take Imodium as needed for diarrhea and Tylenol for headache.  The pt will call the office if she does not continue to feel better over the next few days. Pt agreed with plans.

## 2011-05-05 ENCOUNTER — Other Ambulatory Visit: Payer: PRIVATE HEALTH INSURANCE

## 2011-05-16 ENCOUNTER — Other Ambulatory Visit: Payer: PRIVATE HEALTH INSURANCE

## 2011-05-27 DIAGNOSIS — I6529 Occlusion and stenosis of unspecified carotid artery: Secondary | ICD-10-CM

## 2011-07-07 ENCOUNTER — Other Ambulatory Visit: Payer: Self-pay | Admitting: Cardiovascular Disease

## 2011-07-17 ENCOUNTER — Telehealth: Payer: Self-pay | Admitting: Cardiovascular Disease

## 2011-07-17 NOTE — Telephone Encounter (Signed)
I spoke with the pt and she does not have a cold.  The pt said she actually called because her lawyer gave her forms for Dr Excell Seltzer to complete about disability.  The pt was calling to see if I was in the office so that she can hand deliver them to me.  The pt would like to pick-up forms when they are ready.

## 2011-07-17 NOTE — Telephone Encounter (Signed)
Pt calling re medicine, what can she take as far as cold meds?

## 2011-07-29 ENCOUNTER — Telehealth: Payer: Self-pay | Admitting: Cardiovascular Disease

## 2011-07-29 NOTE — Telephone Encounter (Signed)
Fu call °Pt returning your call  °

## 2011-07-29 NOTE — Telephone Encounter (Signed)
I spoke with the pt and made her aware that Dr Excell Seltzer completed her paperwork. The pt will come into the office today and pick up forms.

## 2011-08-26 ENCOUNTER — Other Ambulatory Visit (HOSPITAL_COMMUNITY): Payer: Self-pay | Admitting: Internal Medicine

## 2011-08-31 ENCOUNTER — Ambulatory Visit (INDEPENDENT_AMBULATORY_CARE_PROVIDER_SITE_OTHER): Payer: PRIVATE HEALTH INSURANCE | Admitting: Family Medicine

## 2011-08-31 VITALS — BP 123/61 | HR 80 | Temp 98.4°F | Resp 16 | Ht 65.0 in | Wt 214.0 lb

## 2011-08-31 DIAGNOSIS — H9209 Otalgia, unspecified ear: Secondary | ICD-10-CM

## 2011-08-31 DIAGNOSIS — N888 Other specified noninflammatory disorders of cervix uteri: Secondary | ICD-10-CM

## 2011-08-31 MED ORDER — AMOXICILLIN-POT CLAVULANATE 875-125 MG PO TABS: 1.0000 | ORAL_TABLET | Freq: Two times a day (BID) | ORAL | Status: DC

## 2011-08-31 NOTE — Progress Notes (Signed)
[facility]:  Office Visit  Chief Complaint:  Chief Complaint  Patient presents with  . Mass    R side of neck.  Brenda [name]    R sided    HPI: Brenda [name] is a 51 y.o. female who complains of  Right neck mass which has had more itchiness, and has been more painful l x 5 days. She at one point was able to press on mass and produce white drainage but it has become more painful. No fevers, weightloss,   Past Medical History  Diagnosis Date  . HTN (hypertension)   . CAD (coronary artery disease)   . Carotid artery occlusion   . Renal artery stenosis   . PVD (peripheral vascular disease)   . Hypercholesteremia   . Cardiomyopathy   . COPD (chronic obstructive pulmonary disease)   . Pulmonary nodule   . Anemia   . Arthritis    Past Surgical History  Procedure Date  . Cardiac catheterization   . Gallbladder surgery   . External ear surgery     as a child  . Coronary stent placement      Successful stenting of the right renal ostium   complicated by distal wire perforation.  The wire perforation was  treated with embolization of the branch vessels involved.  Will check CT   scan to assess the right renal parenchymal hematoma.  She will be   maintained on both aspirin and Plavix.  Will place a Foley catheter to   avoid clotting since hematuria is likely.     History   Social History  . Marital Status: Married    Spouse Name: N/A    Number of Children: N/A  . Years of Education: N/A   Social History Main Topics  . Smoking status: Former Smoker    Types: Cigarettes    Quit date: 06/09/2005  . Smokeless tobacco: None  . Alcohol Use: No  . Drug Use: No  . Sexually Active: None   Other Topics Concern  . None   Social History Narrative  . None   Family History  Problem Relation Age of Onset  . Coronary artery disease    . Heart disease Mother   . Cancer Father    Allergies  Allergen Reactions  . Lisinopril     REACTION: D/C DUE TO COUGH/MC    . Oxycodone    Prior to Admission medications   Medication Sig Start Date End Date Taking? Authorizing Provider  aspirin 325 MG tablet Take 325 mg by mouth daily.     Yes Historical Provider, MD  carvedilol (COREG) 6.25 MG tablet Take 1 tablet (6.25 mg total) by mouth 2 (two) times daily with a meal. 10/01/10  Yes Tonny Bollman, MD  DIOVAN 80 MG tablet TAKE ONE TABLET BY MOUTH EVERY DAY 11/29/10  Yes Tonny Bollman, MD  diphenhydramine-acetaminophen (TYLENOL PM) 25-500 MG TABS Take 1 tablet by mouth at bedtime as needed.     Yes Historical Provider, MD  furosemide (LASIX) 20 MG tablet TAKE ONE TABLET BY MOUTH EVERY OTHER DAY 07/07/11  Yes Tonny Bollman, MD  KLOR-CON M20 20 MEQ tablet TAKE ONE TABLET BY MOUTH EVERY OTHER DAY 12/14/10  Yes Tonny Bollman, MD  PLAVIX 75 MG tablet TAKE ONE TABLET BY MOUTH EVERY DAY 11/29/10  Yes Tonny Bollman, MD  simvastatin (ZOCOR) 80 MG tablet TAKE ONE TABLET BY MOUTH EVERY DAY 08/26/11  Yes Dolores Patty, MD  azelastine (ASTEPRO) 137  MCG/SPRAY nasal spray 1 spray by Nasal route 2 (two) times daily. Use in each nostril as directed     Historical Provider, MD     ROS: The patient denies fevers, chills, night sweats, unintentional weight loss, chest pain, palpitations, wheezing, dyspnea on exertion, nausea, vomiting, abdominal pain, dysuria, hematuria, melena, numbness, weakness, or tingling.   All other systems have been reviewed and were otherwise negative with the exception of those mentioned in the HPI and as above.    PHYSICAL EXAM: Filed Vitals:   08/31/11 1522  BP: 123/61  Pulse: 80  Temp: 98.4 F (36.9 C)  Resp: 16   Filed Vitals:   08/31/11 1522  Height: 5\' 5"  (1.651 m)  Weight: 214 lb (97.07 kg)   Body mass index is 35.61 kg/(m^2).  General: Alert, no acute distress HEENT:  Normocephalic, atraumatic, oropharynx patent.  Cardiovascular:  Regular rate and rhythm, no rubs murmurs or gallops.  No Carotid bruits, radial pulse intact. No  pedal edema.  Respiratory: Clear to auscultation bilaterally.  No wheezes, rales, or rhonchi.  No cyanosis, no use of accessory musculature GI: No organomegaly, abdomen is soft and non-tender, positive bowel sounds.  No masses. Skin: No rashes. Neurologic: Facial musculature symmetric. Psychiatric: Patient is appropriate throughout our interaction. Lymphatic: + Right posterior cervical lymphadenopathy 2.5  Cm by 3 cm Musculoskeletal: Gait intact.   LABS:   EKG/XRAY:   Primary read interpreted by Dr. Conley Rolls at [facility].   ASSESSMENT/PLAN: Encounter Diagnosis  Name Primary?  . Cervical mass Yes   ? Etiology LAD vs infectious process vs cyst vs more malignant entity. Will give Augmentin and refer to ENT.      Hamilton Capri PHUONG, DO 08/31/2011 4:18 PM

## 2011-09-02 ENCOUNTER — Telehealth: Payer: Self-pay

## 2011-09-02 NOTE — Telephone Encounter (Signed)
Pt seen in office on Sunday by Dr Conley Rolls she can't afford rx that was called in and would like for Dr to prescribe something cheaper pharmacy [facility] on [address]

## 2011-09-04 ENCOUNTER — Other Ambulatory Visit: Payer: Self-pay | Admitting: Family Medicine

## 2011-09-04 DIAGNOSIS — N888 Other specified noninflammatory disorders of cervix uteri: Secondary | ICD-10-CM

## 2011-09-04 MED ORDER — AMOXICILLIN 500 MG PO CAPS: 500.0000 mg | ORAL_CAPSULE | Freq: Three times a day (TID) | ORAL | Status: AC

## 2011-09-04 NOTE — Telephone Encounter (Signed)
PT STATES THIS HAS ALREADY BEEN TAKEN CARE OF.

## 2011-09-12 ENCOUNTER — Encounter (INDEPENDENT_AMBULATORY_CARE_PROVIDER_SITE_OTHER): Payer: PRIVATE HEALTH INSURANCE

## 2011-09-12 ENCOUNTER — Other Ambulatory Visit (INDEPENDENT_AMBULATORY_CARE_PROVIDER_SITE_OTHER): Payer: PRIVATE HEALTH INSURANCE

## 2011-09-12 DIAGNOSIS — I70219 Atherosclerosis of native arteries of extremities with intermittent claudication, unspecified extremity: Secondary | ICD-10-CM

## 2011-09-12 DIAGNOSIS — I739 Peripheral vascular disease, unspecified: Secondary | ICD-10-CM

## 2011-09-12 DIAGNOSIS — E7800 Pure hypercholesterolemia, unspecified: Secondary | ICD-10-CM

## 2011-09-12 DIAGNOSIS — I700 Atherosclerosis of aorta: Secondary | ICD-10-CM

## 2011-09-12 DIAGNOSIS — I5022 Chronic systolic (congestive) heart failure: Secondary | ICD-10-CM

## 2011-09-12 LAB — HEPATIC FUNCTION PANEL
ALT: 19 U/L (ref 0–35)
AST: 24 U/L (ref 0–37)
Albumin: 3.7 g/dL (ref 3.5–5.2)
Alkaline Phosphatase: 77 U/L (ref 39–117)

## 2011-09-12 LAB — LIPID PANEL
HDL: 37.6 mg/dL — ABNORMAL LOW (ref >39.00)
Triglycerides: 110 mg/dL (ref 0.0–149.0)

## 2011-09-12 LAB — BASIC METABOLIC PANEL
Calcium: 9.3 mg/dL (ref 8.4–10.5)
Creatinine, Ser: 0.9 mg/dL (ref 0.4–1.2)
GFR: 67.8 mL/min (ref >60.00)
Glucose, Bld: 100 mg/dL — ABNORMAL HIGH (ref 70–99)
Sodium: 142 mEq/L (ref 135–145)

## 2011-09-19 ENCOUNTER — Ambulatory Visit (INDEPENDENT_AMBULATORY_CARE_PROVIDER_SITE_OTHER): Payer: PRIVATE HEALTH INSURANCE | Admitting: Cardiovascular Disease

## 2011-09-19 ENCOUNTER — Encounter: Payer: Self-pay | Admitting: Cardiovascular Disease

## 2011-09-19 ENCOUNTER — Encounter (INDEPENDENT_AMBULATORY_CARE_PROVIDER_SITE_OTHER): Payer: PRIVATE HEALTH INSURANCE

## 2011-09-19 VITALS — BP 118/80 | HR 66 | Ht 63.0 in | Wt 212.0 lb

## 2011-09-19 DIAGNOSIS — I70219 Atherosclerosis of native arteries of extremities with intermittent claudication, unspecified extremity: Secondary | ICD-10-CM

## 2011-09-19 DIAGNOSIS — I701 Atherosclerosis of renal artery: Secondary | ICD-10-CM

## 2011-09-19 DIAGNOSIS — I739 Peripheral vascular disease, unspecified: Secondary | ICD-10-CM

## 2011-09-19 DIAGNOSIS — I6529 Occlusion and stenosis of unspecified carotid artery: Secondary | ICD-10-CM

## 2011-09-19 DIAGNOSIS — I2510 Atherosclerotic heart disease of native coronary artery without angina pectoris: Secondary | ICD-10-CM

## 2011-09-19 DIAGNOSIS — E7800 Pure hypercholesterolemia, unspecified: Secondary | ICD-10-CM

## 2011-09-19 NOTE — Patient Instructions (Signed)
Your physician wants you to follow-up in: 6 MONTHS. You will receive a reminder letter in the mail two months in advance. If you don't receive a letter, please call our office to schedule the follow-up appointment.  Your physician has requested that you have a renal artery duplex in 6 MONTHS. During this test, an ultrasound is used to evaluate blood flow to the kidneys. Allow one hour for this exam. Do not eat after midnight the day before and avoid carbonated beverages. Take your medications as you usually do.  Your physician recommends that you continue on your current medications as directed. Please refer to the Current Medication list given to you today.

## 2011-09-22 ENCOUNTER — Other Ambulatory Visit: Payer: Self-pay | Admitting: Cardiology

## 2011-09-22 DIAGNOSIS — I739 Peripheral vascular disease, unspecified: Secondary | ICD-10-CM

## 2011-09-23 ENCOUNTER — Encounter (INDEPENDENT_AMBULATORY_CARE_PROVIDER_SITE_OTHER): Payer: PRIVATE HEALTH INSURANCE

## 2011-09-23 DIAGNOSIS — I739 Peripheral vascular disease, unspecified: Secondary | ICD-10-CM

## 2011-09-24 ENCOUNTER — Encounter: Payer: Self-pay | Admitting: Cardiovascular Disease

## 2011-09-24 NOTE — Assessment & Plan Note (Signed)
The patient has stable intermittent claudication with moderate lifestyle limitation. ABIs were reviewed and the absolute values don't have much meaning in the setting of severe upper extremity arterial occlusive disease. However, she does have brisk and biphasic waveforms at the ankle. Have recommended continued medical therapy. The patient will be considered for the [place] study which randomizes PAD patients to Plavix versus brilinta.

## 2011-09-24 NOTE — Assessment & Plan Note (Signed)
She has been on high dose simvastatin for many years. Lipids and LFTs have been followed closely. Recent values show LDL at goal of less than 70. Her liver function tests have been normal.

## 2011-09-24 NOTE — Assessment & Plan Note (Signed)
Followed by vascular surgery. Upcoming carotid duplex in May. She has had no recent stroke or TIA symptoms.

## 2011-09-24 NOTE — Progress Notes (Signed)
   HPI:  51 year-old woman presenting for followup of peripheral arterial disease. She has extensive diffuse disease and is s/p multiple revascularization procedures. These include carotid bypass, repeated renal stenting procedures, and bilateral iliac stenting. She has occluded SFA's bilaterally.  The patient has bilateral calf aching with moderate level activity. The right leg is worse than left. Overall her symptoms are reported as stable. She also has right arm "cramping" when she carries her purse. She denies chest pain or pressure. She denies rest pain in her legs. She has not had any ulcers or areas of skin breakdown. She's been compliant with her medications. She denies headache or vision changes.  Outpatient Encounter Prescriptions as of 09/19/2011  Medication Sig Dispense Refill  . aspirin 325 MG tablet Take 325 mg by mouth daily.        . carvedilol (COREG) 6.25 MG tablet Take 1 tablet (6.25 mg total) by mouth 2 (two) times daily with a meal.  60 tablet  11  . DIOVAN 80 MG tablet TAKE ONE TABLET BY MOUTH EVERY DAY  30 each  11  . diphenhydrAMINE (BENADRYL) 25 mg capsule Take 25 mg by mouth every 6 (six) hours as needed.      . diphenhydramine-acetaminophen (TYLENOL PM) 25-500 MG TABS Take 1 tablet by mouth at bedtime as needed.        . furosemide (LASIX) 20 MG tablet TAKE ONE TABLET BY MOUTH EVERY OTHER DAY  30 tablet  4  . KLOR-CON M20 20 MEQ tablet TAKE ONE TABLET BY MOUTH EVERY OTHER DAY  30 each  11  . PLAVIX 75 MG tablet TAKE ONE TABLET BY MOUTH EVERY DAY  30 each  11  . simvastatin (ZOCOR) 80 MG tablet TAKE ONE TABLET BY MOUTH EVERY DAY  30 tablet  6  . DISCONTD: azelastine (ASTEPRO) 137 MCG/SPRAY nasal spray 1 spray by Nasal route 2 (two) times daily. Use in each nostril as directed         Allergies  Allergen Reactions  . Lisinopril     REACTION: D/C DUE TO COUGH/MC  . Oxycodone     Past Medical History  Diagnosis Date  . HTN (hypertension)   . CAD (coronary artery  disease)   . Carotid artery occlusion   . Renal artery stenosis   . PVD (peripheral vascular disease)   . Hypercholesteremia   . Cardiomyopathy   . COPD (chronic obstructive pulmonary disease)   . Pulmonary nodule   . Anemia   . Arthritis     ROS: Negative except as per HPI  BP 118/80  Pulse 66  Ht 5\' 3"  (1.6 m)  Wt 96.163 kg (212 lb)  BMI 37.55 kg/m2  PHYSICAL EXAM: Pt is alert and oriented, pleasant obese woman in NAD HEENT: normal Neck: JVP - normal, carotids 2+= with bilateral bruits Lungs: CTA bilaterally CV: RRR without murmur or gallop Abd: soft, NT, Positive BS, no hepatomegaly Ext: no C/C/E, pedal pulses are nonpalpable Skin: warm/dry no rash  EKG:  Normal sinus rhythm 66 beats per minute, left bundle branch block.  ASSESSMENT AND PLAN:

## 2011-09-24 NOTE — Assessment & Plan Note (Signed)
She is status post multiple renal PTA procedures. She is due for a followup renal arterial duplex scan in 6 months.

## 2011-10-10 DIAGNOSIS — Z006 Encounter for examination for normal comparison and control in clinical research program: Secondary | ICD-10-CM | POA: Insufficient documentation

## 2011-10-13 ENCOUNTER — Encounter: Payer: Self-pay | Admitting: *Deleted

## 2011-10-13 ENCOUNTER — Other Ambulatory Visit: Payer: Self-pay | Admitting: *Deleted

## 2011-10-17 ENCOUNTER — Other Ambulatory Visit: Payer: PRIVATE HEALTH INSURANCE

## 2011-10-20 ENCOUNTER — Encounter: Payer: Self-pay | Admitting: Neurosurgery

## 2011-10-21 ENCOUNTER — Ambulatory Visit (INDEPENDENT_AMBULATORY_CARE_PROVIDER_SITE_OTHER): Payer: PRIVATE HEALTH INSURANCE | Admitting: Vascular Surgery

## 2011-10-21 ENCOUNTER — Encounter: Payer: Self-pay | Admitting: Neurosurgery

## 2011-10-21 ENCOUNTER — Ambulatory Visit (INDEPENDENT_AMBULATORY_CARE_PROVIDER_SITE_OTHER): Payer: PRIVATE HEALTH INSURANCE | Admitting: Neurosurgery

## 2011-10-21 VITALS — BP 110/60 | HR 74 | Resp 16 | Ht 64.0 in | Wt 212.0 lb

## 2011-10-21 DIAGNOSIS — I6529 Occlusion and stenosis of unspecified carotid artery: Secondary | ICD-10-CM

## 2011-10-21 DIAGNOSIS — G458 Other transient cerebral ischemic attacks and related syndromes: Secondary | ICD-10-CM

## 2011-10-21 DIAGNOSIS — Z48812 Encounter for surgical aftercare following surgery on the circulatory system: Secondary | ICD-10-CM

## 2011-10-21 NOTE — Progress Notes (Signed)
[facility] HISTORY AND PHYSICAL   CC: Annual carotid duplex for a history of known stenosis Referring Physician: Hart Rochester  History of Present Illness: 51 year old female patient that underwent a right subclavian carotid bypass and right vertebral transposition with Dr. Madilyn Fireman in 2009, she's also had bilateral CEAs in the past. Patient is asymptomatic she has no signs of upper extremity steal, she shows no signs or symptoms of CVA, TIA, amaurosis fugax and has no neural deficits.  Past Medical History  Diagnosis Date  . HTN (hypertension)   . CAD (coronary artery disease)   . Carotid artery occlusion   . Renal artery stenosis   . PVD (peripheral vascular disease)   . Hypercholesteremia   . Cardiomyopathy   . COPD (chronic obstructive pulmonary disease)   . Pulmonary nodule   . Anemia   . Arthritis   . CHF (congestive heart failure) 2007    ROS: [x]  Positive   [ ]  Denies    General: [ ]  Weight loss, [ ]  Fever, [ ]  chills Neurologic: [ ]  Dizziness, [ ]  Blackouts, [ ]  Seizure [ ]  Stroke, [ ]  "Mini stroke", [ ]  Slurred speech, [ ]  Temporary blindness; [ ]  weakness in arms or legs, [ ]  Hoarseness Cardiac: [ ]  Chest pain/pressure, [ ]  Shortness of breath at rest [ ]  Shortness of breath with exertion, [ ]  Atrial fibrillation or irregular heartbeat Vascular: [ ]  Pain in legs with walking, [ ]  Pain in legs at rest, [ ]  Pain in legs at night,  [ ]  Non-healing ulcer, [ ]  Blood clot in vein/DVT,   Pulmonary: [ ]  Home oxygen, [ ]  Productive cough, [ ]  Coughing up blood, [ ]  Asthma,  [ ]  Wheezing Musculoskeletal:  [ ]  Arthritis, [ ]  Low back pain, [ ]  Joint pain Hematologic: [ ]  Easy Bruising, [ ]  Anemia; [ ]  Hepatitis Gastrointestinal: [ ]  Blood in stool, [ ]  Gastroesophageal Reflux/heartburn, [ ]  Trouble swallowing Urinary: [ ]  chronic Kidney disease, [ ]  on HD - [ ]  MWF or [ ]  TTHS, [ ]  Burning with urination, [ ]  Difficulty urinating Skin: [ ]  Rashes, [ ]   Wounds Psychological: [ ]  Anxiety, [ ]  Depression   Social History History  Substance Use Topics  . Smoking status: Former Smoker    Types: Cigarettes    Quit date: 06/09/2005  . Smokeless tobacco: Not on file  . Alcohol Use: No    Family History Family History  Problem Relation Age of Onset  . Coronary artery disease    . Heart disease Mother 61    Heart Disease before age 42  . Hypertension Mother   . Cancer Father     Allergies  Allergen Reactions  . Lisinopril     REACTION: D/C DUE TO COUGH/MC  . Oxycodone     Current Outpatient Prescriptions  Medication Sig Dispense Refill  . carvedilol (COREG) 6.25 MG tablet Take 1 tablet (6.25 mg total) by mouth 2 (two) times daily with a meal.  60 tablet  11  . DIOVAN 80 MG tablet TAKE ONE TABLET BY MOUTH EVERY DAY  30 each  11  . diphenhydrAMINE (BENADRYL) 25 mg capsule Take 25 mg by mouth every 6 (six) hours as needed.      . diphenhydramine-acetaminophen (TYLENOL PM) 25-500 MG TABS Take 1 tablet by mouth at bedtime as needed.        . furosemide (LASIX) 20 MG tablet TAKE ONE TABLET BY MOUTH EVERY OTHER  DAY  30 tablet  4  . KLOR-CON M20 20 MEQ tablet TAKE ONE TABLET BY MOUTH EVERY OTHER DAY  30 each  11  . NON FORMULARY Take 1 tablet by mouth 2 (two) times daily. EUCLID STUDY DRUG-Plavix vs Brilinta      . simvastatin (ZOCOR) 80 MG tablet TAKE ONE TABLET BY MOUTH EVERY DAY  30 tablet  6    Physical Examination  Filed Vitals:   10/21/11 1541  BP: 110/60  Pulse: 74  Resp: 16    Body mass index is 36.39 kg/(m^2).  General:  WDWN in NAD Gait: Normal HEENT: WNL Eyes: Pupils equal Pulmonary: normal non-labored breathing , without Rales, rhonchi,  wheezing Cardiac: RRR, without  Murmurs, rubs or gallops; Abdomen: soft, NT, no masses Skin: no rashes, ulcers noted  Vascular Exam Pulses: 2+ radial pulses bilaterally: Carotid bruits: Left carotid pulse to auscultation, mild right sided bruit noted Extremities without  ischemic changes, no Gangrene , no cellulitis; no open wounds;  Musculoskeletal: no muscle wasting or atrophy   Neurologic: A&O X 3; Appropriate Affect ; SENSATION: normal; MOTOR FUNCTION:  moving all extremities equally. Speech is fluent/normal  Non-Invasive Vascular Imaging CAROTID DUPLEX 10/21/2011  Right ICA 20 - 39 % stenosis Left ICA 20 - 39 % stenosis   ASSESSMENT/PLAN: Patient with significant history of vascular problems with corrected CEA and a subclavian bypass. She is asymptomatic I did review the carotid duplex with Dr. Hart Rochester who states we should follow the patient in one year with her repeat carotid duplex. The patient is in agreement with this, her questions were encouraged and answered.   Lauree Chandler ANP   Clinic MD: Hart Rochester

## 2011-10-22 NOTE — Progress Notes (Signed)
Addended by: Sharee Pimple on: 10/22/2011 08:38 AM   Modules accepted: Orders

## 2011-10-27 NOTE — Procedures (Unsigned)
CAROTID DUPLEX EXAM  INDICATION:  Carotid stenosis  HISTORY: Diabetes:  No Cardiac:  CHF Hypertension:  Yes Smoking:  Previous Previous Surgery:  Bilateral carotid endarterectomies with right subclavian artery to carotid bifurcation bypass graft and right vertebral transposition on 05/10/2008 CV History:  Currently asymptomatic Amaurosis Fugax No, Paresthesias No, Hemiparesis No                                      RIGHT             LEFT Brachial systolic pressure:         130               118 Brachial Doppler waveforms:         WNL               Biphasic Vertebral direction of flow:        Antegrade         Retrograde, abnormal DUPLEX VELOCITIES (cm/sec) CCA peak systolic                   78 (BPG)          211-P/161-M ECA peak systolic                   103               213 ICA peak systolic                   79                78 ICA end diastolic                   22                21 PLAQUE MORPHOLOGY:                  Heterogeneous     Heterogeneous PLAQUE AMOUNT:                      Mild to moderate  Mild PLAQUE LOCATION:                    SCA BPG mid       CCA/ICA/ECA  IMPRESSION: 1. Patent right subclavian to carotid bifurcation bypass graft,     elevated velocity of 249 cm/s present with tortuosity and     heterogeneous plaque visualized at the mid segment. 2. Right internal carotid artery is patent with history of     endarterectomy, no hyperplasia or significant plaque identified. 3. Right external carotid artery patent. 4. Left external carotid artery stenosis present. 5. Left internal carotid artery patent with history of endarterectomy,     no hyperplasia or significant plaque identified. 6. Right vertebral is patent and antegrade. 7. Left vertebral is abnormal and retrograde. 8. Abnormal left subclavian artery suggestive of high-grade stenosis     versus vessel occlusion with a peak systolic velocity of 23 cm/s at     the  proximal segment. 9. Difference in bilateral blood pressures suggestive of subclavian     steal syndrome.  ___________________________________________ Quita Skye Hart Rochester, M.D.  SH/MEDQ  D:  10/21/2011  T:  10/21/2011  Job:  [id number]

## 2011-10-31 ENCOUNTER — Other Ambulatory Visit: Payer: Self-pay | Admitting: Cardiovascular Disease

## 2011-12-11 ENCOUNTER — Other Ambulatory Visit: Payer: Self-pay | Admitting: Cardiovascular Disease

## 2011-12-12 NOTE — Telephone Encounter (Signed)
Fax Received. Refill Completed. Brenda [name] (R.M.A)   

## 2011-12-15 ENCOUNTER — Other Ambulatory Visit: Payer: Self-pay | Admitting: Cardiovascular Disease

## 2012-01-17 ENCOUNTER — Other Ambulatory Visit: Payer: Self-pay | Admitting: Cardiovascular Disease

## 2012-01-19 NOTE — Telephone Encounter (Signed)
...   Requested Prescriptions   Pending Prescriptions Disp Refills  . KLOR-CON M20 20 MEQ tablet [Pharmacy Med Name: KLOR-CON M20        TAB] 30 each 5    Sig: TAKE ONE TABLET BY MOUTH EVERY OTHER DAY  Patient needs to contact office to schedule a 6 mths  appointment  for  Oct 2013.Ph:[phone]. Thank you

## 2012-04-07 ENCOUNTER — Other Ambulatory Visit: Payer: Self-pay | Admitting: Cardiology

## 2012-04-07 ENCOUNTER — Encounter (INDEPENDENT_AMBULATORY_CARE_PROVIDER_SITE_OTHER): Payer: Medicare Other

## 2012-04-07 DIAGNOSIS — I739 Peripheral vascular disease, unspecified: Secondary | ICD-10-CM

## 2012-04-07 DIAGNOSIS — I700 Atherosclerosis of aorta: Secondary | ICD-10-CM | POA: Diagnosis not present

## 2012-04-07 DIAGNOSIS — E7800 Pure hypercholesterolemia, unspecified: Secondary | ICD-10-CM

## 2012-04-07 DIAGNOSIS — I2510 Atherosclerotic heart disease of native coronary artery without angina pectoris: Secondary | ICD-10-CM

## 2012-04-07 DIAGNOSIS — I701 Atherosclerosis of renal artery: Secondary | ICD-10-CM

## 2012-04-12 ENCOUNTER — Encounter (INDEPENDENT_AMBULATORY_CARE_PROVIDER_SITE_OTHER): Payer: Medicare Other

## 2012-04-12 DIAGNOSIS — I739 Peripheral vascular disease, unspecified: Secondary | ICD-10-CM

## 2012-04-12 DIAGNOSIS — R0989 Other specified symptoms and signs involving the circulatory and respiratory systems: Secondary | ICD-10-CM

## 2012-04-13 ENCOUNTER — Ambulatory Visit: Payer: PRIVATE HEALTH INSURANCE | Admitting: Cardiovascular Disease

## 2012-04-27 ENCOUNTER — Ambulatory Visit (INDEPENDENT_AMBULATORY_CARE_PROVIDER_SITE_OTHER): Payer: Medicare Other | Admitting: Cardiovascular Disease

## 2012-04-27 ENCOUNTER — Other Ambulatory Visit: Payer: Self-pay | Admitting: *Deleted

## 2012-04-27 VITALS — BP 120/70 | HR 76 | Ht 65.0 in | Wt 214.0 lb

## 2012-04-27 DIAGNOSIS — I701 Atherosclerosis of renal artery: Secondary | ICD-10-CM

## 2012-04-27 DIAGNOSIS — I70219 Atherosclerosis of native arteries of extremities with intermittent claudication, unspecified extremity: Secondary | ICD-10-CM | POA: Diagnosis not present

## 2012-04-27 MED ORDER — SIMVASTATIN 40 MG PO TABS: 40.0000 mg | ORAL_TABLET | Freq: Once | ORAL | Status: DC

## 2012-04-27 NOTE — Patient Instructions (Addendum)
Your physician wants you to follow-up in: 6 months with Dr. Cooper.  You will receive a reminder letter in the mail two months in advance. If you don't receive a letter, please call our office to schedule the follow-up appointment.   

## 2012-05-05 ENCOUNTER — Encounter: Payer: Self-pay | Admitting: Cardiovascular Disease

## 2012-05-05 NOTE — Progress Notes (Signed)
HPI:  51 year old woman presenting for followup evaluation. She has extensive peripheral arterial disease. She's undergone carotid bypass, renal stenting procedures, and bilateral iliac stenting. She has total occlusion of her bilateral SFA is. The patient has mild limitation from intermittent claudication. She complains of some numbness in her right foot, but this is positional. She does not have exertional numbness in the foot. She is able to walk longer when she has something to lean on. She has not had rest pain or ulceration. She's been less active over the last several months. She denies chest pain or shortness of breath.  Outpatient Encounter Prescriptions as of 04/27/2012  Medication Sig Dispense Refill  . carvedilol (COREG) 6.25 MG tablet TAKE ONE TABLET BY MOUTH TWICE DAILY WITH MEALS **MUST  HAVE  OFFICE  VISIT  FOR  ADDITIONAL  REFILLS**  60 tablet  5  . DIOVAN 80 MG tablet TAKE ONE TABLET BY MOUTH EVERY DAY  30 each  6  . diphenhydrAMINE (BENADRYL) 25 mg capsule Take 25 mg by mouth every 6 (six) hours as needed.      . diphenhydramine-acetaminophen (TYLENOL PM) 25-500 MG TABS Take 1 tablet by mouth at bedtime as needed.        . furosemide (LASIX) 20 MG tablet TAKE ONE TABLET BY MOUTH EVERY OTHER DAY  30 tablet  4  . KLOR-CON M20 20 MEQ tablet TAKE ONE TABLET BY MOUTH EVERY OTHER DAY  30 each  5  . NON FORMULARY Take 1 tablet by mouth 2 (two) times daily. EUCLID STUDY DRUG-Plavix vs Brilinta      . simvastatin (ZOCOR) 40 MG tablet Take 1 tablet (40 mg total) by mouth once.  30 tablet  6  . [DISCONTINUED] simvastatin (ZOCOR) 80 MG tablet TAKE ONE TABLET BY MOUTH EVERY DAY  30 tablet  6    Allergies  Allergen Reactions  . Lisinopril     REACTION: D/C DUE TO COUGH/MC  . Oxycodone     Past Medical History  Diagnosis Date  . HTN (hypertension)   . CAD (coronary artery disease)   . Carotid artery occlusion   . Renal artery stenosis   . PVD (peripheral vascular disease)   .  Hypercholesteremia   . Cardiomyopathy   . COPD (chronic obstructive pulmonary disease)   . Pulmonary nodule   . Anemia   . Arthritis   . CHF (congestive heart failure) 2007    ROS: Negative except as per HPI  BP 120/70  Pulse 76  Ht 5\' 5"  (1.651 m)  Wt 97.07 kg (214 lb)  BMI 35.61 kg/m2  PHYSICAL EXAM: Pt is alert and oriented, NAD HEENT: normal Neck: JVP - normal, carotids 2+= with bilateral bruits Lungs: CTA bilaterally CV: RRR without murmur or gallop Abd: soft, NT, Positive BS, no hepatomegaly Ext: no C/C/E, distal pulses nonpalpable Skin: warm/dry no rash  EKG:  Normal sinus rhythm 76 beats per minute, left bundle branch block, no significant change from previous tracing.  ASSESSMENT AND PLAN: 1. Lower extremity peripheral arterial disease with intermittent claudication. Her ABIs are inaccurate because of upper extremity disease. The patient continues in the [place] study which randomizes her to Plavix versus brilinta. I encouraged her to increase her walking program. I would like to see her back in 6 months for followup evaluation.  2. Renal artery stenosis. Recent renal arterial duplex was reviewed. Her kidney sizes are normal bilaterally. She has greater than 60% left renal artery stenosis and this will be managed  medically.  3. Hyperlipidemia. Cholesterol is 126, triglycerides 110, HDL 38, and LDL 66. The patient is on simvastatin.  4. Hypertension. The patient remains on carvedilol and Diovan. Because of her 4 extremity PAD it is difficult to know her true blood pressure.  For followup, I will see her back in 6 months.  Tonny Bollman 05/05/2012 11:57 PM

## 2012-05-20 ENCOUNTER — Ambulatory Visit (INDEPENDENT_AMBULATORY_CARE_PROVIDER_SITE_OTHER): Payer: Medicare Other | Admitting: Family Medicine

## 2012-05-20 VITALS — BP 121/73 | HR 73 | Temp 97.9°F | Resp 20 | Ht 65.0 in | Wt 211.0 lb

## 2012-05-20 DIAGNOSIS — T148 Other injury of unspecified body region: Secondary | ICD-10-CM | POA: Diagnosis not present

## 2012-05-20 DIAGNOSIS — R05 Cough: Secondary | ICD-10-CM | POA: Diagnosis not present

## 2012-05-20 DIAGNOSIS — R059 Cough, unspecified: Secondary | ICD-10-CM

## 2012-05-20 DIAGNOSIS — J449 Chronic obstructive pulmonary disease, unspecified: Secondary | ICD-10-CM

## 2012-05-20 DIAGNOSIS — W57XXXA Bitten or stung by nonvenomous insect and other nonvenomous arthropods, initial encounter: Secondary | ICD-10-CM

## 2012-05-20 DIAGNOSIS — R509 Fever, unspecified: Secondary | ICD-10-CM

## 2012-05-20 LAB — POCT CBC
Granulocyte percent: 70.2 %G (ref 37–80)
HCT, POC: 49.1 % — AB (ref 37.7–47.9)
Hemoglobin: 15.6 g/dL (ref 12.2–16.2)
Lymph, poc: 2.5 (ref 0.6–3.4)
MCV: 95.4 fL (ref 80–97)
POC Granulocyte: 7.9 — AB (ref 2–6.9)
POC LYMPH PERCENT: 22.1 %L (ref 10–50)
RDW, POC: 13.4 %

## 2012-05-20 LAB — POCT INFLUENZA A/B
Influenza A, POC: NEGATIVE
Influenza B, POC: NEGATIVE

## 2012-05-20 MED ORDER — BENZONATATE 100 MG PO CAPS: 100.0000 mg | ORAL_CAPSULE | Freq: Two times a day (BID) | ORAL | Status: DC | PRN

## 2012-05-20 MED ORDER — DOXYCYCLINE HYCLATE 100 MG PO TABS: 100.0000 mg | ORAL_TABLET | Freq: Two times a day (BID) | ORAL | Status: DC

## 2012-05-20 MED ORDER — TIOTROPIUM BROMIDE MONOHYDRATE 18 MCG IN CAPS: 18.0000 ug | ORAL_CAPSULE | Freq: Every day | RESPIRATORY_TRACT | Status: DC

## 2012-05-20 NOTE — Patient Instructions (Addendum)
We are going to use doxycycline for bronchitis. Also, start back on your spiriva to help with coughing.   You can use the tessalon perles for cough as well- swallow one whole three times a day as needed.   Let me know if you are not better in the next few days- Sooner if worse.

## 2012-05-20 NOTE — Progress Notes (Signed)
[facility] [address], [place] [address] [phone]  Date:  05/20/2012   Name:  Brenda [name]   DOB:  01-29-1961   MRN:  [id number]  PCP:  MAZZOCCHI, Rise Mu, MD    Chief Complaint: Cough, Fatigue, Nausea, Emesis, Fever, Otalgia, Sore Throat, Nasal Congestion and Insect Bite   History of Present Illness:  Brenda [name] is a 51 y.o. very pleasant female patient who presents with the following:  Here today with illness- she has had a ST, earache, vomiting (over the weekend), still has nausea. She also has a cough. The first couple of days she had fevers and chills, as well as aches.  However these symptoms are now resolved.  She does note some wheezing.   Cough is productive of clear mucus.    She also has noted a possible insect bite on her right lateral calf- this has been present for about a week.  It has seemed swollen and inflamed, but is actually better today.  She has been scratching it some  She does have a history of COPD.  She is supposed to be on spiriva, but she has not taken it in some time- she ran out of her refills.  Former smoker, quit about 5 years ago  Patient Active Problem List  Diagnosis  . HYPERCHOLESTEROLEMIA  . ANEMIA, CHRONIC  . HYPERTENSION  . CAD  . CARDIOMYOPATHY  . CAROTID ARTERY OCCLUSION  . RENAL ARTERY STENOSIS  . Atherosclerosis of native arteries of the extremities with intermittent claudication  . COPD  . PULMONARY NODULE, SOLITARY  . Research subject    Past Medical History  Diagnosis Date  . HTN (hypertension)   . CAD (coronary artery disease)   . Carotid artery occlusion   . Renal artery stenosis   . PVD (peripheral vascular disease)   . Hypercholesteremia   . Cardiomyopathy   . COPD (chronic obstructive pulmonary disease)   . Pulmonary nodule   . Anemia   . Arthritis   . CHF (congestive heart failure) 2007    Past Surgical History  Procedure Date  . Cardiac catheterization   .  Gallbladder surgery   . External ear surgery     as a child  . Coronary stent placement      Successful stenting of the right renal ostium   complicated by distal wire perforation.  The wire perforation was  treated with embolization of the branch vessels involved.  Will check CT   scan to assess the right renal parenchymal hematoma.  She will be   maintained on both aspirin and Plavix.  Will place a Foley catheter to   avoid clotting since hematuria is likely.      History  Substance Use Topics  . Smoking status: Former Smoker    Types: Cigarettes    Quit date: 06/09/2005  . Smokeless tobacco: Not on file  . Alcohol Use: No    Family History  Problem Relation Age of Onset  . Coronary artery disease    . Heart disease Mother 65    Heart Disease before age 23  . Hypertension Mother   . Cancer Father     Allergies  Allergen Reactions  . Lisinopril     REACTION: D/C DUE TO COUGH/MC  . Oxycodone     Medication list has been reviewed and updated.  Current Outpatient Prescriptions on File Prior to Visit  Medication Sig Dispense Refill  . carvedilol (COREG) 6.25 MG  tablet TAKE ONE TABLET BY MOUTH TWICE DAILY WITH MEALS **MUST  HAVE  OFFICE  VISIT  FOR  ADDITIONAL  REFILLS**  60 tablet  5  . DIOVAN 80 MG tablet TAKE ONE TABLET BY MOUTH EVERY DAY  30 each  6  . diphenhydrAMINE (BENADRYL) 25 mg capsule Take 25 mg by mouth every 6 (six) hours as needed.      . diphenhydramine-acetaminophen (TYLENOL PM) 25-500 MG TABS Take 1 tablet by mouth at bedtime as needed.        . furosemide (LASIX) 20 MG tablet TAKE ONE TABLET BY MOUTH EVERY OTHER DAY  30 tablet  4  . KLOR-CON M20 20 MEQ tablet TAKE ONE TABLET BY MOUTH EVERY OTHER DAY  30 each  5  . NON FORMULARY Take 1 tablet by mouth 2 (two) times daily. EUCLID STUDY DRUG-Plavix vs Brilinta      . simvastatin (ZOCOR) 40 MG tablet Take 1 tablet (40 mg total) by mouth once.  30 tablet  6    Review of Systems:  As per HPI- otherwise  negative.   Physical Examination: Filed Vitals:   05/20/12 0818  BP: 121/73  Pulse: 73  Temp: 97.9 F (36.6 C)  Resp: 20   Filed Vitals:   05/20/12 0818  Height: 5\' 5"  (1.651 m)  Weight: 211 lb (95.709 kg)   Body mass index is 35.11 kg/(m^2). Ideal Body Weight: Weight in (lb) to have BMI = 25: 149.9   GEN: WDWN, NAD, Non-toxic, A & O x 3, obese HEENT: Atraumatic, Normocephalic. Neck supple. No masses, No LAD. Bilateral TM wnl, oropharynx normal.  PEERL,EOMI.   Ears and Nose: No external deformity. CV: RRR, No M/G/R. No JVD. No thrill. No extra heart sounds. PULM: CTA B, no wheezes, crackles, rhonchi. No retractions. No resp. distress. No accessory muscle use. ABD: S, NT, ND, +BS. No rebound. No HSM. EXTR: No c/c/e NEURO Normal gait.  PSYCH: Normally interactive. Conversant. Not depressed or anxious appearing.  Calm demeanor.  Right calf: there is a healing excoriated lesion that appears consistent with an insect bite.    Results for orders placed in visit on 05/20/12  POCT CBC      Component Value Range   WBC 11.2 (*) 4.6 - 10.2 K/uL   Lymph, poc 2.5  0.6 - 3.4   POC LYMPH PERCENT 22.1  10 - 50 %L   MID (cbc) 0.9  0 - 0.9   POC MID % 7.7  0 - 12 %M   POC Granulocyte 7.9 (*) 2 - 6.9   Granulocyte percent 70.2  37 - 80 %G   RBC 5.15  4.04 - 5.48 M/uL   Hemoglobin 15.6  12.2 - 16.2 g/dL   HCT, POC 81.1 (*) 91.4 - 47.9 %   MCV 95.4  80 - 97 fL   MCH, POC 30.3  27 - 31.2 pg   MCHC 31.8  31.8 - 35.4 g/dL   RDW, POC 78.2     Platelet Count, POC 425 (*) 142 - 424 K/uL   MPV 8.4  0 - 99.8 fL  POCT INFLUENZA A/B      Component Value Range   Influenza A, POC Negative     Influenza B, POC Negative      Assessment and Plan: 1. Fever  POCT CBC, POCT Influenza A/B, doxycycline (VIBRA-TABS) 100 MG tablet  2. Cough  POCT Influenza A/B, doxycycline (VIBRA-TABS) 100 MG tablet, benzonatate (TESSALON) 100 MG capsule  3. Insect  bite    4. COPD (chronic obstructive pulmonary  disease)  tiotropium (SPIRIVA HANDIHALER) 18 MCG inhalation capsule   Bronchitis- treat with doxycycline.  She also has a history of COPD- will start back on spiriva.  Tessalon perles as needed.  Insect bite does not appear to be infected but doxycycline will cover this as well.  See patient instructions for more details.     Abbe Amsterdam, MD

## 2012-06-19 ENCOUNTER — Other Ambulatory Visit: Payer: Self-pay | Admitting: Cardiovascular Disease

## 2012-06-19 ENCOUNTER — Other Ambulatory Visit (HOSPITAL_COMMUNITY): Payer: Self-pay | Admitting: Internal Medicine

## 2012-06-21 ENCOUNTER — Other Ambulatory Visit: Payer: Self-pay | Admitting: *Deleted

## 2012-06-21 MED ORDER — FUROSEMIDE 20 MG PO TABS: 20.0000 mg | ORAL_TABLET | ORAL | Status: DC

## 2012-06-28 ENCOUNTER — Other Ambulatory Visit: Payer: Self-pay

## 2012-06-28 ENCOUNTER — Other Ambulatory Visit: Payer: Self-pay | Admitting: *Deleted

## 2012-07-20 ENCOUNTER — Other Ambulatory Visit: Payer: Self-pay | Admitting: Cardiovascular Disease

## 2012-09-27 ENCOUNTER — Encounter: Payer: Self-pay | Admitting: *Deleted

## 2012-10-18 ENCOUNTER — Encounter (INDEPENDENT_AMBULATORY_CARE_PROVIDER_SITE_OTHER): Payer: Medicare Other

## 2012-10-18 DIAGNOSIS — I701 Atherosclerosis of renal artery: Secondary | ICD-10-CM | POA: Diagnosis not present

## 2012-10-18 DIAGNOSIS — I739 Peripheral vascular disease, unspecified: Secondary | ICD-10-CM

## 2012-10-18 DIAGNOSIS — I700 Atherosclerosis of aorta: Secondary | ICD-10-CM | POA: Diagnosis not present

## 2012-10-19 ENCOUNTER — Encounter: Payer: Self-pay | Admitting: Cardiovascular Disease

## 2012-10-19 ENCOUNTER — Ambulatory Visit (INDEPENDENT_AMBULATORY_CARE_PROVIDER_SITE_OTHER): Payer: Medicare Other | Admitting: Cardiovascular Disease

## 2012-10-19 VITALS — BP 110/84 | HR 73 | Ht 65.0 in | Wt 211.0 lb

## 2012-10-19 DIAGNOSIS — I70219 Atherosclerosis of native arteries of extremities with intermittent claudication, unspecified extremity: Secondary | ICD-10-CM

## 2012-10-19 DIAGNOSIS — I2510 Atherosclerotic heart disease of native coronary artery without angina pectoris: Secondary | ICD-10-CM | POA: Diagnosis not present

## 2012-10-19 DIAGNOSIS — I701 Atherosclerosis of renal artery: Secondary | ICD-10-CM | POA: Diagnosis not present

## 2012-10-19 DIAGNOSIS — I5032 Chronic diastolic (congestive) heart failure: Secondary | ICD-10-CM

## 2012-10-19 NOTE — Progress Notes (Signed)
HPI:  52 year-old woman with severe extensive peripheral arterial disease. She's undergone carotid bypass, renal artery stenting, and bilateral iliac stenting. She has total occlusion of her superficial femoral arteries. She has significant stenotic disease of both subclavian and both lower extremities, so it is always difficult to obtain a blood pressure.  About a week ago she described pressure in her chest radiating down the right arm. She had similar symptoms previously when she had congestive heart failure. She denies shortness of breath, orthopnea, leg swelling, or PND. She feels better now. This episode was self-limited and lasted a few hours. She did not take nitroglycerin or any other medications.  Her walking distance remains limited. She can't walk much at all without something to lean on. She is able to walk around the track as long as she has something like a stroller to lean onto. She has some numbness in her feet. She denies sores or ulceration. She complains of bilateral upper arm pain and she is using her arms. This feels like an ache. Overall her leg and arm claudication symptoms are unchanged over the past few years.  Outpatient Encounter Prescriptions as of 10/19/2012  Medication Sig Dispense Refill  . benzonatate (TESSALON) 100 MG capsule Take 1 capsule (100 mg total) by mouth 2 (two) times daily as needed for cough.  20 capsule  0  . carvedilol (COREG) 6.25 MG tablet Take 1 tablet (6.25 mg total) by mouth 2 (two) times daily with a meal.  60 tablet  3  . dextromethorphan (DELSYM) 30 MG/5ML liquid Take 60 mg by mouth as needed.      Marland Kitchen DIOVAN 80 MG tablet TAKE ONE TABLET BY MOUTH EVERY DAY  30 each  6  . diphenhydrAMINE (BENADRYL) 25 mg capsule Take 25 mg by mouth every 6 (six) hours as needed.      . diphenhydramine-acetaminophen (TYLENOL PM) 25-500 MG TABS Take 1 tablet by mouth at bedtime as needed.        . furosemide (LASIX) 20 MG tablet Take 1 tablet (20 mg total) by mouth  every other day.  15 tablet  6  . KLOR-CON M20 20 MEQ tablet TAKE ONE TABLET BY MOUTH EVERY OTHER DAY  30 each  5  . NON FORMULARY Take 1 tablet by mouth 2 (two) times daily. EUCLID STUDY DRUG-Plavix vs Brilinta      . simvastatin (ZOCOR) 40 MG tablet Take 1 tablet (40 mg total) by mouth once.  30 tablet  6  . tiotropium (SPIRIVA HANDIHALER) 18 MCG inhalation capsule Place 1 capsule (18 mcg total) into inhaler and inhale daily.  30 capsule  12  . [DISCONTINUED] doxycycline (VIBRA-TABS) 100 MG tablet Take 1 tablet (100 mg total) by mouth 2 (two) times daily.  20 tablet  0   No facility-administered encounter medications on file as of 10/19/2012.    Allergies  Allergen Reactions  . Lisinopril     REACTION: D/C DUE TO COUGH/MC  . Oxycodone     Vomiting     Past Medical History  Diagnosis Date  . HTN (hypertension)   . CAD (coronary artery disease)   . Carotid artery occlusion   . Renal artery stenosis   . PVD (peripheral vascular disease)   . Hypercholesteremia   . Cardiomyopathy   . COPD (chronic obstructive pulmonary disease)   . Pulmonary nodule   . Anemia   . Arthritis   . CHF (congestive heart failure) 2007    ROS: Negative except  as per HPI  BP 110/84  Pulse 73  Ht 5\' 5"  (1.651 m)  Wt 95.709 kg (211 lb)  BMI 35.11 kg/m2  SpO2 93%  PHYSICAL EXAM: Pt is alert and oriented, NAD HEENT: normal Neck: JVP - normal, carotids 2+= with bilateral bruits Lungs: CTA bilaterally CV: RRR with harsh grade 2/6 systolic murmur at the right upper sternal border Abd: soft, NT, Positive BS, no hepatomegaly Ext: no C/C/E Skin: warm/dry no rash  EKG:  Sinus rhythm with left bundle branch block, heart rate 73 beats per minute, no significant change from previous.  ASSESSMENT AND PLAN: 1. The patient has a chronic diastolic heart failure related to underlying hypertension. She has a left bundle branch block pattern on EKG. She had some symptoms recently that occurred with heart  failure exacerbations in the past. I think we should repeat an echocardiogram. Her last study was done in 2009. She'll continue on her current medical program.  2. Lower extremity peripheral arterial disease with lifestyle limiting intermittent claudication. Overall symptoms are stable. She has iliac disease as well as bilateral SFA occlusion. Will continue medical management at this time. ABIs are not accurate because of upper extremity occlusive disease. She is on antiplatelet therapy through the [facility].  3. Renal artery stenosis. Patient has a history of renal artery stenting. Will check a metabolic panel. Renal function has been stable last checked one year ago with a creatinine of 0.9.  4. Carotid stenosis without history of stroke. She has upcoming followup with vascular surgery.  For followup I will see her back in 6 months.  Tonny Bollman 10/19/2012 10:05 AM

## 2012-10-19 NOTE — Patient Instructions (Addendum)
Your physician has requested that you have an echocardiogram. Echocardiography is a painless test that uses sound waves to create images of your heart. It provides your doctor with information about the size and shape of your heart and how well your heart's chambers and valves are working. This procedure takes approximately one hour. There are no restrictions for this procedure.  Your physician recommends that you return for a FASTING LIPID, LIVER and BMP--nothing to eat or drink after midnight, lab opens at 7:30 (this can be done the same day as Echo)  Your physician recommends that you continue on your current medications as directed. Please refer to the Current Medication list given to you today.  Your physician wants you to follow-up in: 6 MONTHS with Dr Excell Seltzer.  You will receive a reminder letter in the mail two months in advance. If you don't receive a letter, please call our office to schedule the follow-up appointment.

## 2012-10-21 ENCOUNTER — Encounter: Payer: Self-pay | Admitting: Cardiovascular Disease

## 2012-10-25 ENCOUNTER — Other Ambulatory Visit (INDEPENDENT_AMBULATORY_CARE_PROVIDER_SITE_OTHER): Payer: Medicare Other

## 2012-10-25 ENCOUNTER — Ambulatory Visit (HOSPITAL_COMMUNITY): Payer: Medicare Other | Attending: Cardiology | Admitting: Radiology

## 2012-10-25 DIAGNOSIS — E669 Obesity, unspecified: Secondary | ICD-10-CM | POA: Insufficient documentation

## 2012-10-25 DIAGNOSIS — I5032 Chronic diastolic (congestive) heart failure: Secondary | ICD-10-CM | POA: Diagnosis not present

## 2012-10-25 DIAGNOSIS — I10 Essential (primary) hypertension: Secondary | ICD-10-CM | POA: Diagnosis not present

## 2012-10-25 DIAGNOSIS — I701 Atherosclerosis of renal artery: Secondary | ICD-10-CM | POA: Insufficient documentation

## 2012-10-25 DIAGNOSIS — I6529 Occlusion and stenosis of unspecified carotid artery: Secondary | ICD-10-CM | POA: Insufficient documentation

## 2012-10-25 DIAGNOSIS — I2510 Atherosclerotic heart disease of native coronary artery without angina pectoris: Secondary | ICD-10-CM

## 2012-10-25 DIAGNOSIS — I079 Rheumatic tricuspid valve disease, unspecified: Secondary | ICD-10-CM | POA: Insufficient documentation

## 2012-10-25 DIAGNOSIS — J449 Chronic obstructive pulmonary disease, unspecified: Secondary | ICD-10-CM | POA: Diagnosis not present

## 2012-10-25 DIAGNOSIS — E7800 Pure hypercholesterolemia, unspecified: Secondary | ICD-10-CM | POA: Diagnosis not present

## 2012-10-25 DIAGNOSIS — I70219 Atherosclerosis of native arteries of extremities with intermittent claudication, unspecified extremity: Secondary | ICD-10-CM | POA: Diagnosis not present

## 2012-10-25 DIAGNOSIS — Z87891 Personal history of nicotine dependence: Secondary | ICD-10-CM | POA: Insufficient documentation

## 2012-10-25 DIAGNOSIS — I428 Other cardiomyopathies: Secondary | ICD-10-CM | POA: Insufficient documentation

## 2012-10-25 DIAGNOSIS — I739 Peripheral vascular disease, unspecified: Secondary | ICD-10-CM | POA: Diagnosis not present

## 2012-10-25 DIAGNOSIS — I509 Heart failure, unspecified: Secondary | ICD-10-CM | POA: Diagnosis not present

## 2012-10-25 DIAGNOSIS — J4489 Other specified chronic obstructive pulmonary disease: Secondary | ICD-10-CM | POA: Insufficient documentation

## 2012-10-25 LAB — BASIC METABOLIC PANEL
Calcium: 9 mg/dL (ref 8.4–10.5)
GFR: 63.54 mL/min (ref >60.00)
Glucose, Bld: 103 mg/dL — ABNORMAL HIGH (ref 70–99)
Sodium: 139 mEq/L (ref 135–145)

## 2012-10-25 LAB — LIPID PANEL
HDL: 31.2 mg/dL — ABNORMAL LOW (ref >39.00)
LDL Cholesterol: 84 mg/dL (ref 0–99)
VLDL: 30.8 mg/dL (ref 0.0–40.0)

## 2012-10-25 LAB — HEPATIC FUNCTION PANEL
Albumin: 3.7 g/dL (ref 3.5–5.2)
Alkaline Phosphatase: 71 U/L (ref 39–117)
Total Bilirubin: 0.5 mg/dL (ref 0.3–1.2)

## 2012-10-25 NOTE — Progress Notes (Signed)
Echocardiogram performed.  

## 2012-10-26 ENCOUNTER — Other Ambulatory Visit (INDEPENDENT_AMBULATORY_CARE_PROVIDER_SITE_OTHER): Payer: Medicare Other | Admitting: *Deleted

## 2012-10-26 ENCOUNTER — Ambulatory Visit: Payer: PRIVATE HEALTH INSURANCE | Admitting: Neurosurgery

## 2012-10-26 DIAGNOSIS — I6529 Occlusion and stenosis of unspecified carotid artery: Secondary | ICD-10-CM

## 2012-10-26 DIAGNOSIS — Z48812 Encounter for surgical aftercare following surgery on the circulatory system: Secondary | ICD-10-CM

## 2012-10-27 ENCOUNTER — Other Ambulatory Visit: Payer: Self-pay | Admitting: *Deleted

## 2012-10-27 ENCOUNTER — Encounter: Payer: Self-pay | Admitting: Vascular Surgery

## 2012-10-27 DIAGNOSIS — Z48812 Encounter for surgical aftercare following surgery on the circulatory system: Secondary | ICD-10-CM

## 2012-11-16 ENCOUNTER — Other Ambulatory Visit: Payer: Self-pay | Admitting: Cardiovascular Disease

## 2012-11-22 ENCOUNTER — Other Ambulatory Visit (INDEPENDENT_AMBULATORY_CARE_PROVIDER_SITE_OTHER): Payer: Medicare Other

## 2012-11-22 ENCOUNTER — Ambulatory Visit: Payer: Medicare Other

## 2012-11-22 ENCOUNTER — Ambulatory Visit (INDEPENDENT_AMBULATORY_CARE_PROVIDER_SITE_OTHER): Payer: Medicare Other | Admitting: Internal Medicine

## 2012-11-22 ENCOUNTER — Other Ambulatory Visit: Payer: Self-pay | Admitting: *Deleted

## 2012-11-22 VITALS — BP 100/78 | HR 80 | Temp 97.9°F | Resp 18 | Wt 210.0 lb

## 2012-11-22 DIAGNOSIS — R221 Localized swelling, mass and lump, neck: Secondary | ICD-10-CM

## 2012-11-22 DIAGNOSIS — R0989 Other specified symptoms and signs involving the circulatory and respiratory systems: Secondary | ICD-10-CM

## 2012-11-22 DIAGNOSIS — M542 Cervicalgia: Secondary | ICD-10-CM

## 2012-11-22 DIAGNOSIS — I871 Compression of vein: Secondary | ICD-10-CM | POA: Diagnosis not present

## 2012-11-22 DIAGNOSIS — R220 Localized swelling, mass and lump, head: Secondary | ICD-10-CM

## 2012-11-22 LAB — POCT CBC
Hemoglobin: 15.3 g/dL (ref 12.2–16.2)
MCH, POC: 30.8 pg (ref 27–31.2)
MPV: 9.3 fL (ref 0–99.8)
POC Granulocyte: 5.8 (ref 2–6.9)
POC MID %: 6.3 %M (ref 0–12)
RBC: 4.97 M/uL (ref 4.04–5.48)
WBC: 8.6 10*3/uL (ref 4.6–10.2)

## 2012-11-22 LAB — POCT SEDIMENTATION RATE: POCT SED RATE: 16 mm/hr (ref 0–22)

## 2012-11-22 NOTE — Progress Notes (Signed)
  Subjective:    Patient ID: Brenda [name], female    DOB: [birth year], 52 y.o.   MRN: [id number]  HPI Neck has a puffy swelling and mild tenderness over 1 week. Has diffuse PVD all over with hx of end arterectomys and stents all over. Dr Excell Seltzer her cardio and Dr. Madilyn Fireman her vascular surgeon. Had visit and ck up with both normal in last 1 month.    Review of Systems     Objective:   Physical Exam  Vitals reviewed. Constitutional: She is oriented to person, place, and time. She appears well-nourished. No distress.  HENT:  Right Ear: External ear normal.  Left Ear: External ear normal.  Mouth/Throat: Oropharynx is clear and moist.  Eyes: EOM are normal. Pupils are equal, round, and reactive to light.  Neck: Normal range of motion and phonation normal. Neck supple. Decreased carotid pulses present. Carotid bruit is present. No rigidity. No tracheal deviation, no edema, no erythema and normal range of motion present. No thyromegaly present.    Puffy swelling right neck Bruit prominent  Cardiovascular: Normal rate, regular rhythm and intact distal pulses.  Exam reveals no gallop.   Murmur heard. Pulmonary/Chest: Effort normal and breath sounds normal. She exhibits no tenderness.  Lymphadenopathy:    She has no cervical adenopathy.  Neurological: She is alert and oriented to person, place, and time. No cranial nerve deficit. She exhibits normal muscle tone. Coordination normal.  Skin: No erythema.  Psychiatric: She has a normal mood and affect.   Unusual puffy swelling right neck, has loud bruit over this area, bruit less loud on left.  [facility] reading (PRIMARY) by  Dr. Perrin Maltese cxr and neck no cause for swelling seen  Results for orders placed in visit on 11/22/12  POCT CBC      Result Value Range   WBC 8.6  4.6 - 10.2 K/uL   Lymph, poc 2.3  0.6 - 3.4   POC LYMPH PERCENT 26.6  10 - 50 %L   MID (cbc) 0.5  0 - 0.9   POC MID % 6.3  0 - 12 %M   POC Granulocyte 5.8  2 - 6.9   Granulocyte  percent 67.1  37 - 80 %G   RBC 4.97  4.04 - 5.48 M/uL   Hemoglobin 15.3  12.2 - 16.2 g/dL   HCT, POC 40.1 (*) 02.7 - 47.9 %   MCV 99.8 (*) 80 - 97 fL   MCH, POC 30.8  27 - 31.2 pg   MCHC 30.8 (*) 31.8 - 35.4 g/dL   RDW, POC 25.3     Platelet Count, POC 325  142 - 424 K/uL   MPV 9.3  0 - 99.8 fL        Assessment & Plan:  Refer to vascular doctor today/Spoke with Dr. Hart Rochester nurse and they will see her now.

## 2012-12-22 ENCOUNTER — Other Ambulatory Visit: Payer: Self-pay | Admitting: Cardiovascular Disease

## 2013-01-26 ENCOUNTER — Other Ambulatory Visit: Payer: Self-pay | Admitting: Cardiovascular Disease

## 2013-01-27 ENCOUNTER — Other Ambulatory Visit: Payer: Self-pay | Admitting: *Deleted

## 2013-03-29 ENCOUNTER — Other Ambulatory Visit: Payer: Self-pay | Admitting: Cardiovascular Disease

## 2013-04-18 ENCOUNTER — Other Ambulatory Visit: Payer: Self-pay | Admitting: Cardiovascular Disease

## 2013-04-19 ENCOUNTER — Ambulatory Visit (INDEPENDENT_AMBULATORY_CARE_PROVIDER_SITE_OTHER): Payer: Medicare Other | Admitting: Cardiovascular Disease

## 2013-04-19 ENCOUNTER — Encounter: Payer: Self-pay | Admitting: Cardiovascular Disease

## 2013-04-19 VITALS — BP 128/82 | HR 78 | Ht 63.0 in | Wt 215.8 lb

## 2013-04-19 DIAGNOSIS — I701 Atherosclerosis of renal artery: Secondary | ICD-10-CM

## 2013-04-19 DIAGNOSIS — I70219 Atherosclerosis of native arteries of extremities with intermittent claudication, unspecified extremity: Secondary | ICD-10-CM | POA: Diagnosis not present

## 2013-04-19 DIAGNOSIS — R0609 Other forms of dyspnea: Secondary | ICD-10-CM

## 2013-04-19 DIAGNOSIS — E7800 Pure hypercholesterolemia, unspecified: Secondary | ICD-10-CM

## 2013-04-19 DIAGNOSIS — I2510 Atherosclerotic heart disease of native coronary artery without angina pectoris: Secondary | ICD-10-CM | POA: Diagnosis not present

## 2013-04-19 DIAGNOSIS — R0683 Snoring: Secondary | ICD-10-CM

## 2013-04-19 DIAGNOSIS — I10 Essential (primary) hypertension: Secondary | ICD-10-CM

## 2013-04-19 NOTE — Progress Notes (Signed)
HPI:   52 year-old woman with severe extensive peripheral arterial disease. She's undergone carotid bypass, renal artery stenting, and bilateral iliac stenting. She has total occlusion of her superficial femoral arteries. She has significant stenotic disease of both subclavian and both lower extremities, so it is always difficult to obtain a blood pressure. Her last duplex studies were in May 2014. She had elevated renal artery velocities and iliac velocities. Her renal to aortic ratio remains less than 4. Excellent flow was seen in both kidneys. Kidney size remains stable. I elected to continue with medical management.  The patient complains of generalized fatigue. She falls asleep during the day. She has difficulty sleeping at night. Her friend told her that she snores excessively. She denies chest pain or shortness of breath. Her leg discomfort is unchanged. She has not been walking on a regular basis. She has bilateral leg pain in the thighs and calves with moderate walking. She's had no rest pain or ulceration. She denies any stroke or TIA symptoms.  Outpatient Encounter Prescriptions as of 04/19/2013  Medication Sig  . carvedilol (COREG) 6.25 MG tablet TAKE ONE TABLET BY MOUTH TWICE DAILY WITH  A  MEAL  . DIOVAN 80 MG tablet TAKE ONE TABLET BY MOUTH EVERY DAY  . furosemide (LASIX) 20 MG tablet TAKE ONE TABLET BY MOUTH EVERY OTHER DAY  . NON FORMULARY Take 1 tablet by mouth 2 (two) times daily. EUCLID STUDY DRUG-Plavix vs Brilinta  . potassium chloride SA (KLOR-CON M20) 20 MEQ tablet Take 1 tablet (20 mEq total) by mouth every other day.  . simvastatin (ZOCOR) 40 MG tablet Take 1 tablet (40 mg total) by mouth once.  . tiotropium (SPIRIVA HANDIHALER) 18 MCG inhalation capsule Place 1 capsule (18 mcg total) into inhaler and inhale daily.  . [DISCONTINUED] furosemide (LASIX) 20 MG tablet Take 1 tablet (20 mg total) by mouth every other day.    Allergies  Allergen Reactions  . Lisinopril       REACTION: D/C DUE TO COUGH/MC  . Oxycodone     Vomiting     Past Medical History  Diagnosis Date  . HTN (hypertension)   . CAD (coronary artery disease)   . Carotid artery occlusion   . Renal artery stenosis   . PVD (peripheral vascular disease)   . Hypercholesteremia   . Cardiomyopathy   . COPD (chronic obstructive pulmonary disease)   . Pulmonary nodule   . Anemia   . Arthritis   . CHF (congestive heart failure) 2007    ROS: Negative except as per HPI  BP 128/82  Pulse 78  Ht 5\' 3"  (1.6 m)  Wt 215 lb 12.8 oz (97.886 kg)  BMI 38.24 kg/m2  PHYSICAL EXAM: Pt is alert and oriented, NAD HEENT: normal Neck: JVP - normal, carotids 2+= with bilateral bruits Lungs: CTA bilaterally CV: RRR with grade 2/6 harsh systolic ejection murmur at the right upper sternal border Abd: soft, NT, Positive BS, no hepatomegaly Ext: no C/C/E, pedal pulses are nonpalpable. Left brachial and radial pulses are nonpalpable. Right brachial and radial pulses are 1+. Skin: warm/dry no rash  EKG:  Sinus rhythm with left bundle branch block 78 beats per minute. No change from baseline EKG.   2-D echocardiogram Oct 25 2012: Left ventricle: Septal dyssynergy. The cavity size was normal. Wall thickness was increased in a pattern of moderate LVH. Systolic function was normal. The estimated ejection fraction was in the range of 50% to 55%.  ------------------------------------------------------------ Aortic  valve: Structurally normal valve. Cusp separation was normal. Doppler: Transvalvular velocity was within the normal range. There was no stenosis. No regurgitation.  ------------------------------------------------------------ Aorta: Aortic root: The aortic root was normal in size.  ------------------------------------------------------------ Mitral valve: Structurally normal valve. Leaflet separation was normal. Doppler: Transvalvular velocity was within the normal range. There was no  evidence for stenosis. No regurgitation. Peak gradient: 3mm Hg (D).  ------------------------------------------------------------ Left atrium: The atrium was at the upper limits of normal in size.  ------------------------------------------------------------ Right ventricle: The cavity size was normal. Systolic function was normal.  ------------------------------------------------------------ Pulmonic valve: The valve appears to be grossly normal. Doppler: No significant regurgitation.  ------------------------------------------------------------ Tricuspid valve: Structurally normal valve. Leaflet separation was normal. Doppler: Transvalvular velocity was within the normal range. Mild regurgitation.  ------------------------------------------------------------ Right atrium: The atrium was normal in size.  ------------------------------------------------------------ Pericardium: There was no pericardial effusion.  ------------------------------------------------------------ Systemic veins: Inferior vena cava: The vessel was normal in size; the respirophasic diameter changes were in the normal range (= 50%); findings are consistent with normal central venous pressure.  ASSESSMENT AND PLAN: 1. Lower extremity peripheral arterial disease with intermittent claudication. We again reviewed the importance of her regular walking program. She will do her best to initiate this. She has significant multilevel disease and will return for aortoiliac and lower extremity duplex imaging prior to her next office visit in 6 months. She is on antiplatelet therapy through the [facility] protocol.  2. renal atherosclerosis. She has undergone extensive renal artery stenting. Stable findings last year by duplex scanning. With bilateral disease will repeat a scan next year before her office followup.  3. Carotid stenosis without history of stroke. She has undergone subclavian to carotid bypass.  Followed by vascular surgery.  4. Chronic diastolic heart failure. Appears stable without limitation from shortness of breath. She is euvolemic by exam. Maintained on a beta blocker and ARB.  5. Somnolence/fatigue. Suggestive history of obstructive sleep apnea with risk factors for such. Recommend an overnight sleep study.  Tonny Bollman 04/19/2013 9:10 AM

## 2013-04-19 NOTE — Patient Instructions (Signed)
Your physician has recommended that you have a sleep study. This test records several body functions during sleep, including: brain activity, eye movement, oxygen and carbon dioxide blood levels, heart rate and rhythm, breathing rate and rhythm, the flow of air through your mouth and nose, snoring, body muscle movements, and chest and belly movement.  Your physician has requested that you have a renal artery duplex in 6 MONTHS. During this test, an ultrasound is used to evaluate blood flow to the kidneys. Allow one hour for this exam. Do not eat after midnight the day before and avoid carbonated beverages. Take your medications as you usually do.  Your physician has requested that you have a lower extremity arterial duplex in 6 MONTHS. This test is an ultrasound of the arteries in the legs. It looks at arterial blood flow in the legs. Allow one hour for Lower Arterial scans. There are no restrictions or special instructions  Your physician wants you to follow-up in: 6 MONTHS with Dr Excell Seltzer. You will receive a reminder letter in the mail two months in advance. If you don't receive a letter, please call our office to schedule the follow-up appointment.  Your physician recommends that you continue on your current medications as directed. Please refer to the Current Medication list given to you today.

## 2013-05-02 DIAGNOSIS — R0609 Other forms of dyspnea: Secondary | ICD-10-CM | POA: Diagnosis not present

## 2013-05-02 DIAGNOSIS — I2510 Atherosclerotic heart disease of native coronary artery without angina pectoris: Secondary | ICD-10-CM | POA: Diagnosis not present

## 2013-05-11 ENCOUNTER — Other Ambulatory Visit: Payer: Self-pay | Admitting: Cardiovascular Disease

## 2013-05-13 ENCOUNTER — Telehealth: Payer: Self-pay | Admitting: Cardiovascular Disease

## 2013-05-13 NOTE — Telephone Encounter (Signed)
Sleep study results are still pending at this time.  Pt aware.

## 2013-05-13 NOTE — Telephone Encounter (Signed)
New message  Pt calling for sleep test results

## 2013-05-17 ENCOUNTER — Telehealth: Payer: Self-pay | Admitting: Cardiovascular Disease

## 2013-05-17 NOTE — Telephone Encounter (Signed)
New Problem:  Pt states she has a really bad cough and she is wanting to know if Dr. Excell Seltzer can call her in some cough medicine. Pt states she does not have PcP. Pt is requesting a call back.

## 2013-05-17 NOTE — Telephone Encounter (Signed)
I spoke with the pt and made her aware that she can take Robitussin DM and Coricidin HBP. I advised the pt that if she started running a fever or felt like she needs to be seen for symptoms then she should go to a local Urgent Care. Pt agreed.

## 2013-05-18 ENCOUNTER — Telehealth: Payer: Self-pay | Admitting: Cardiology

## 2013-05-18 NOTE — Telephone Encounter (Signed)
Please let patient know that she had no evidence overall of sleep apnea unless sleeping in the supine position.  Her overall AHI is low and does not qualify for CPAP.  I have recommended consideration of referral to ENT for evaluation of surgical causes of very mild supine sleep apnea and snoring - I will let final decision to Dr. Excell Seltzer

## 2013-05-18 NOTE — Telephone Encounter (Signed)
Pt is aware. To Dr. Excell Seltzer to make aware. Pt states she will talk to Dr. Excell Seltzer about going forward with ENT referral if he thinks it is necessary.

## 2013-05-19 NOTE — Telephone Encounter (Signed)
Reviewed findings from sleep study. Would not pursue ENT referral at this time since OSA is mild and she has other significant comorbidities. thx

## 2013-05-25 NOTE — Telephone Encounter (Signed)
Pt aware of Dr Cooper's comments.  

## 2013-05-25 NOTE — Telephone Encounter (Signed)
Please see follow up telephone message with sleep study results.

## 2013-06-22 ENCOUNTER — Other Ambulatory Visit: Payer: Self-pay | Admitting: Cardiovascular Disease

## 2013-07-19 ENCOUNTER — Other Ambulatory Visit: Payer: Self-pay | Admitting: Cardiovascular Disease

## 2013-07-26 ENCOUNTER — Other Ambulatory Visit: Payer: Self-pay | Admitting: Cardiovascular Disease

## 2013-07-26 ENCOUNTER — Other Ambulatory Visit: Payer: Self-pay | Admitting: Vascular Surgery

## 2013-07-26 DIAGNOSIS — Z48812 Encounter for surgical aftercare following surgery on the circulatory system: Secondary | ICD-10-CM

## 2013-07-26 DIAGNOSIS — I6529 Occlusion and stenosis of unspecified carotid artery: Secondary | ICD-10-CM

## 2013-08-11 ENCOUNTER — Telehealth: Payer: Self-pay | Admitting: Cardiovascular Disease

## 2013-08-11 NOTE — Telephone Encounter (Signed)
Walk in pt Form " Handicapped paper" Dropped Off gave to Kelby Aline

## 2013-08-15 ENCOUNTER — Other Ambulatory Visit: Payer: Self-pay | Admitting: *Deleted

## 2013-08-15 MED ORDER — LOSARTAN POTASSIUM 50 MG PO TABS: 50.0000 mg | ORAL_TABLET | Freq: Every day | ORAL | Status: DC

## 2013-08-15 NOTE — Telephone Encounter (Signed)
2015 formulary for [employer] does not cover diovan 80 mg, will change to losartan 50 qd per Porfirio Oar, Pharm. D. Patient notified by phone.

## 2013-09-02 ENCOUNTER — Other Ambulatory Visit: Payer: Self-pay | Admitting: Cardiovascular Disease

## 2013-09-06 ENCOUNTER — Telehealth: Payer: Self-pay | Admitting: Cardiovascular Disease

## 2013-09-06 MED ORDER — LOSARTAN POTASSIUM 50 MG PO TABS: 50.0000 mg | ORAL_TABLET | Freq: Every day | ORAL | Status: DC

## 2013-09-06 NOTE — Telephone Encounter (Signed)
New problem   Pt need new prescription for generic for Balsartan 80 b/c her ins isn't paying for name brand. Please call pt is any questions.

## 2013-09-06 NOTE — Telephone Encounter (Signed)
Spoke with patient who states she cannot afford the cost of her Diovan.  After reviewed patient's chart, I discovered that Lovett Sox, RN spoke to patient earlier this month and per Theola Sequin, Pharm-D patient was supposed to be switched to Losartan 50 mg once daily.  A Rx was sent to patient's pharmacy.  I have removed Diovan from the patient's list of medications.

## 2013-09-15 ENCOUNTER — Other Ambulatory Visit: Payer: Self-pay | Admitting: Cardiovascular Disease

## 2013-10-10 ENCOUNTER — Other Ambulatory Visit (HOSPITAL_COMMUNITY): Payer: Self-pay | Admitting: Cardiology

## 2013-10-10 DIAGNOSIS — I739 Peripheral vascular disease, unspecified: Secondary | ICD-10-CM

## 2013-10-17 ENCOUNTER — Ambulatory Visit (HOSPITAL_COMMUNITY): Payer: Medicare Other | Attending: Cardiology | Admitting: Cardiology

## 2013-10-17 ENCOUNTER — Ambulatory Visit (HOSPITAL_BASED_OUTPATIENT_CLINIC_OR_DEPARTMENT_OTHER): Payer: Medicare Other | Admitting: Cardiology

## 2013-10-17 DIAGNOSIS — I739 Peripheral vascular disease, unspecified: Secondary | ICD-10-CM

## 2013-10-17 DIAGNOSIS — I70219 Atherosclerosis of native arteries of extremities with intermittent claudication, unspecified extremity: Secondary | ICD-10-CM

## 2013-10-17 DIAGNOSIS — I701 Atherosclerosis of renal artery: Secondary | ICD-10-CM | POA: Diagnosis not present

## 2013-10-17 DIAGNOSIS — I700 Atherosclerosis of aorta: Secondary | ICD-10-CM | POA: Diagnosis not present

## 2013-10-17 NOTE — Progress Notes (Signed)
Renal artery duplex complete

## 2013-10-17 NOTE — Progress Notes (Signed)
ABI and lower arterial duplex bilateral completed.  

## 2013-10-27 IMAGING — CR DG CERVICAL SPINE 2 OR 3 VIEWS
3 series · 3 of 3 positions shown · non-contrast
Comparison: None.

CLINICAL DATA: Neck swelling

CERVICAL SPINE - 2-3 VIEW

[lateral]
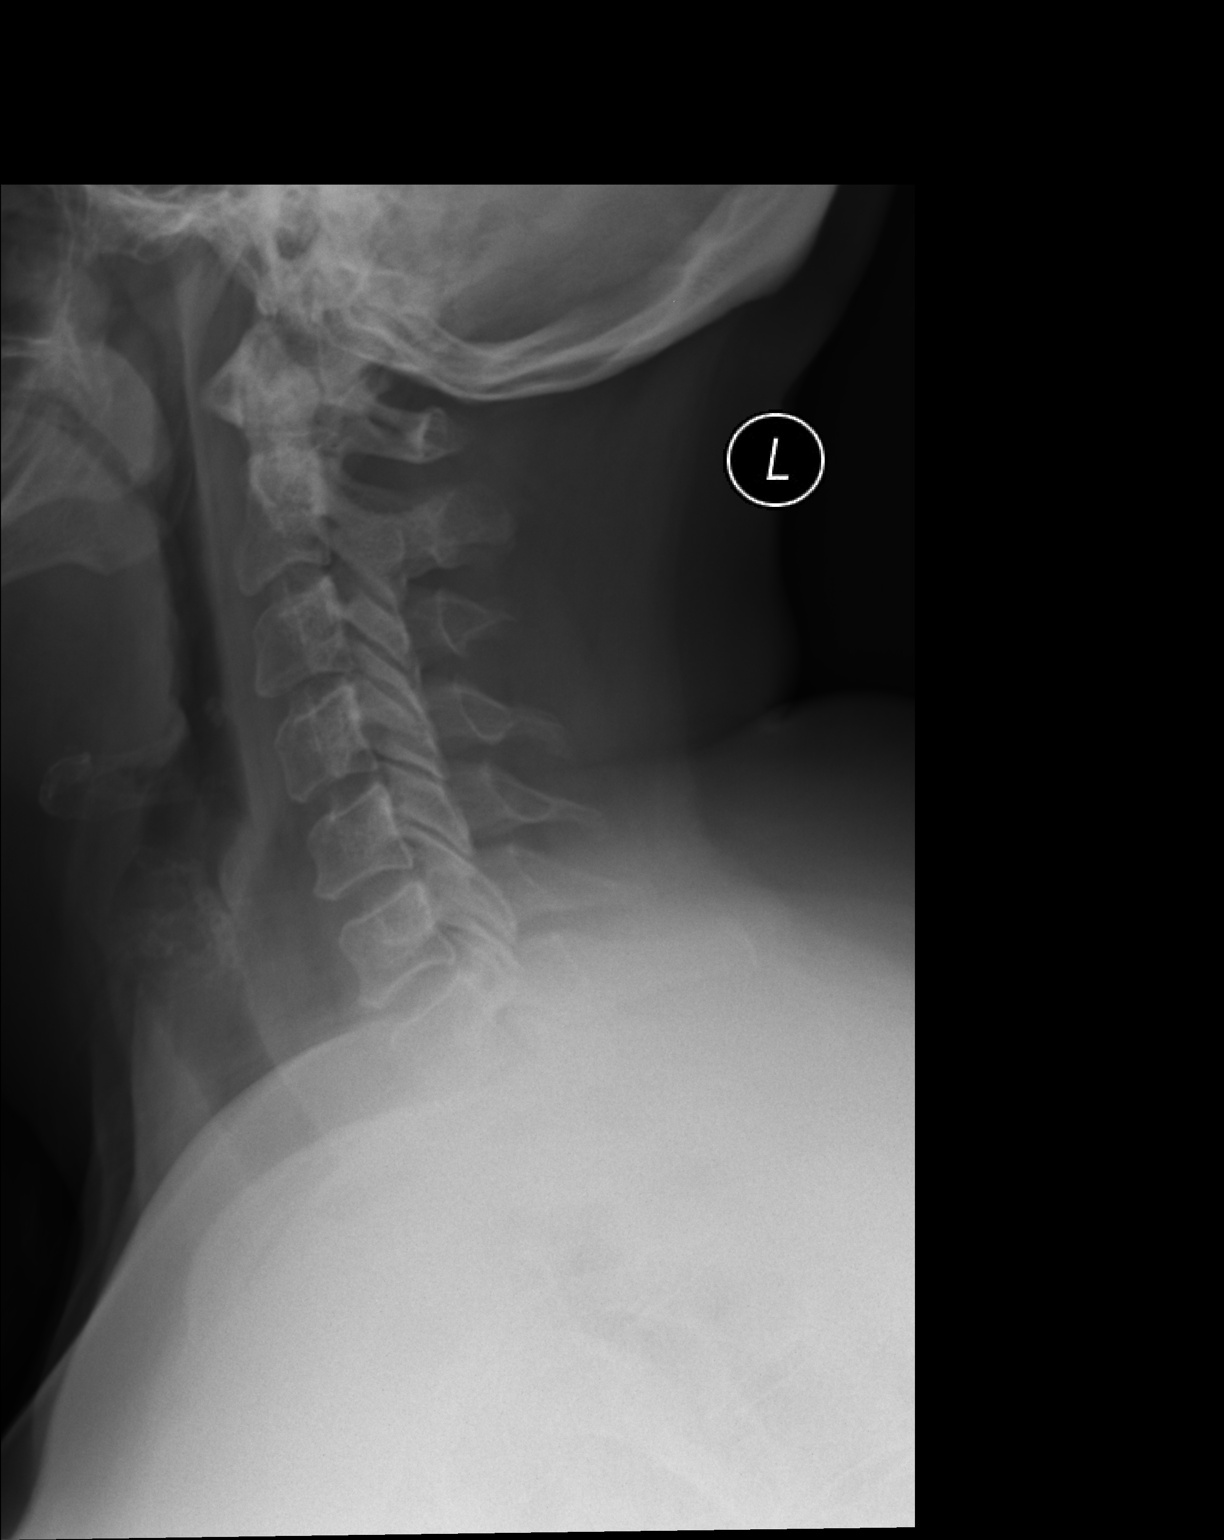

[AP (1 of 2)]
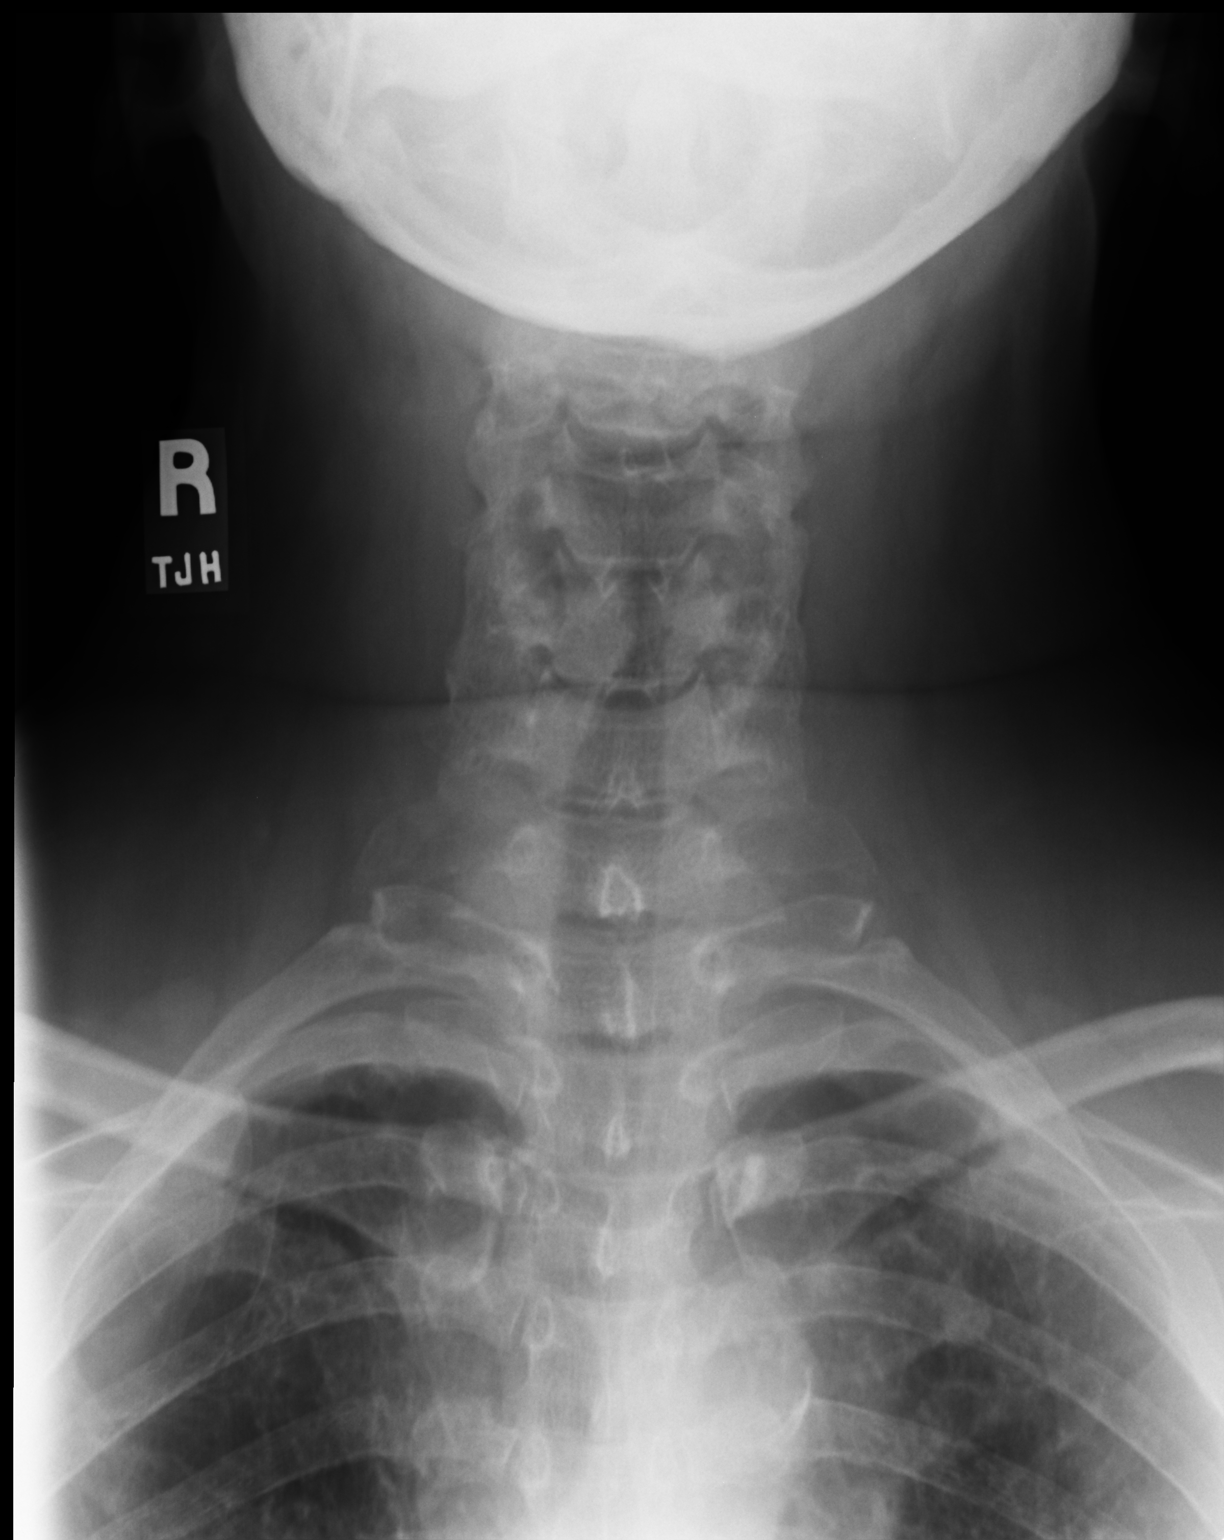

[AP (2 of 2)]
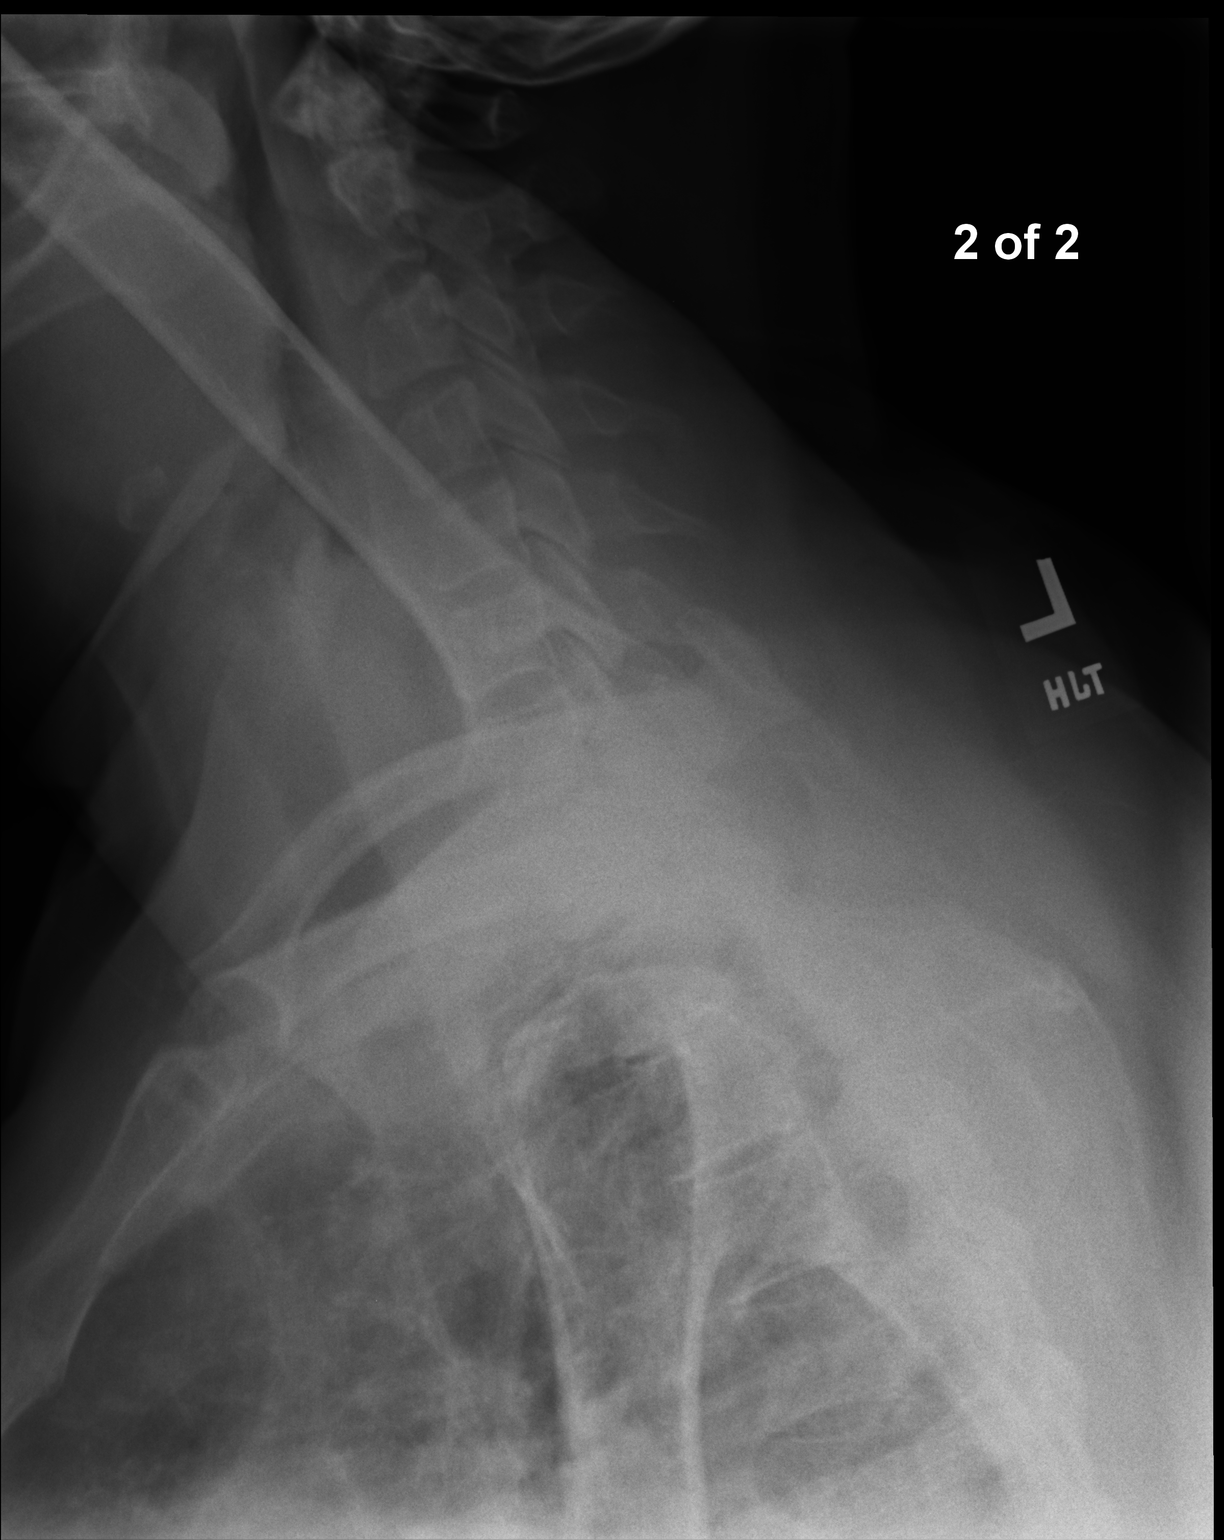

[3 of 3 positions shown; findings below may reference images not displayed]

FINDINGS: Three views of the cervical spine submitted.  No acute
fracture or subluxation.  No prevertebral soft tissue swelling.
Alignment, disc spaces and vertebral height are preserved.
IMPRESSION: No acute fracture or subluxation.

Clinically significant discrepancy from primary report, if
provided: None

## 2013-10-27 IMAGING — CR DG CHEST 2V
2 series · 2 of 2 positions shown · non-contrast
Comparison: 05/16/2009

CLINICAL DATA: Neck swelling

CHEST - 2 VIEW

[PA]
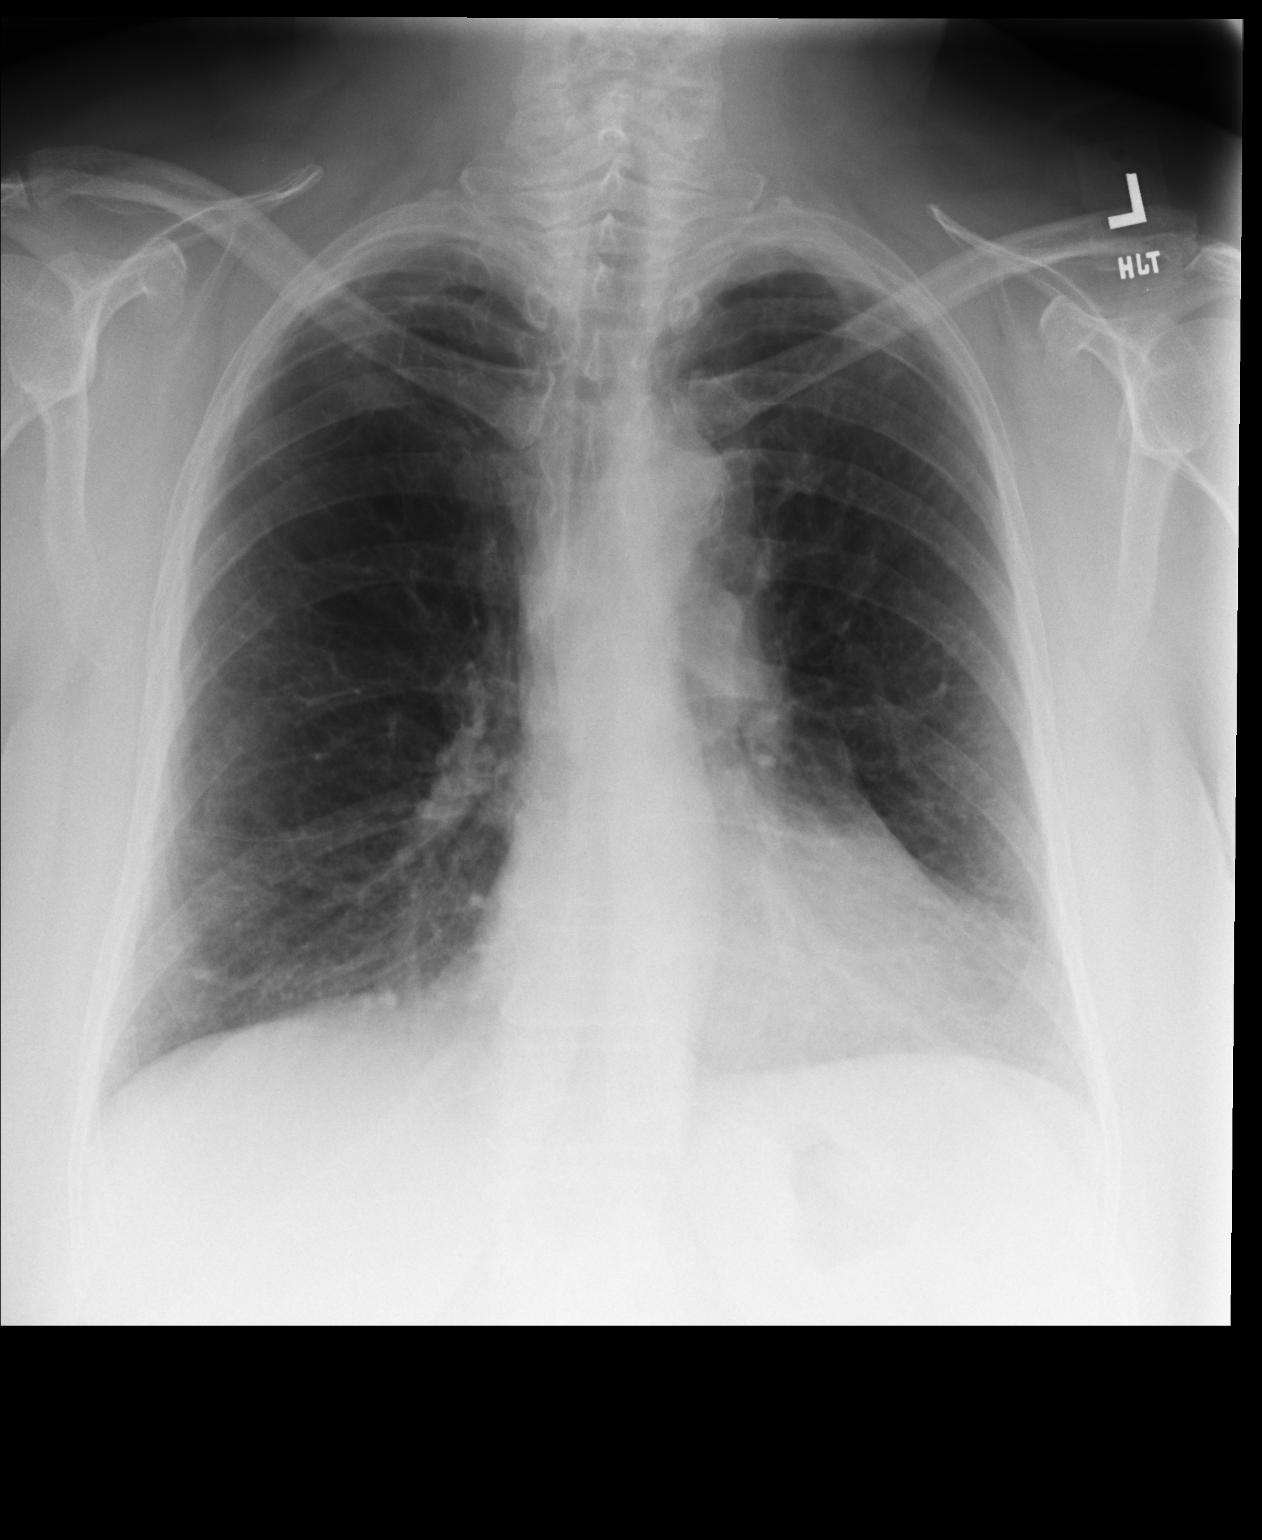

[lateral]
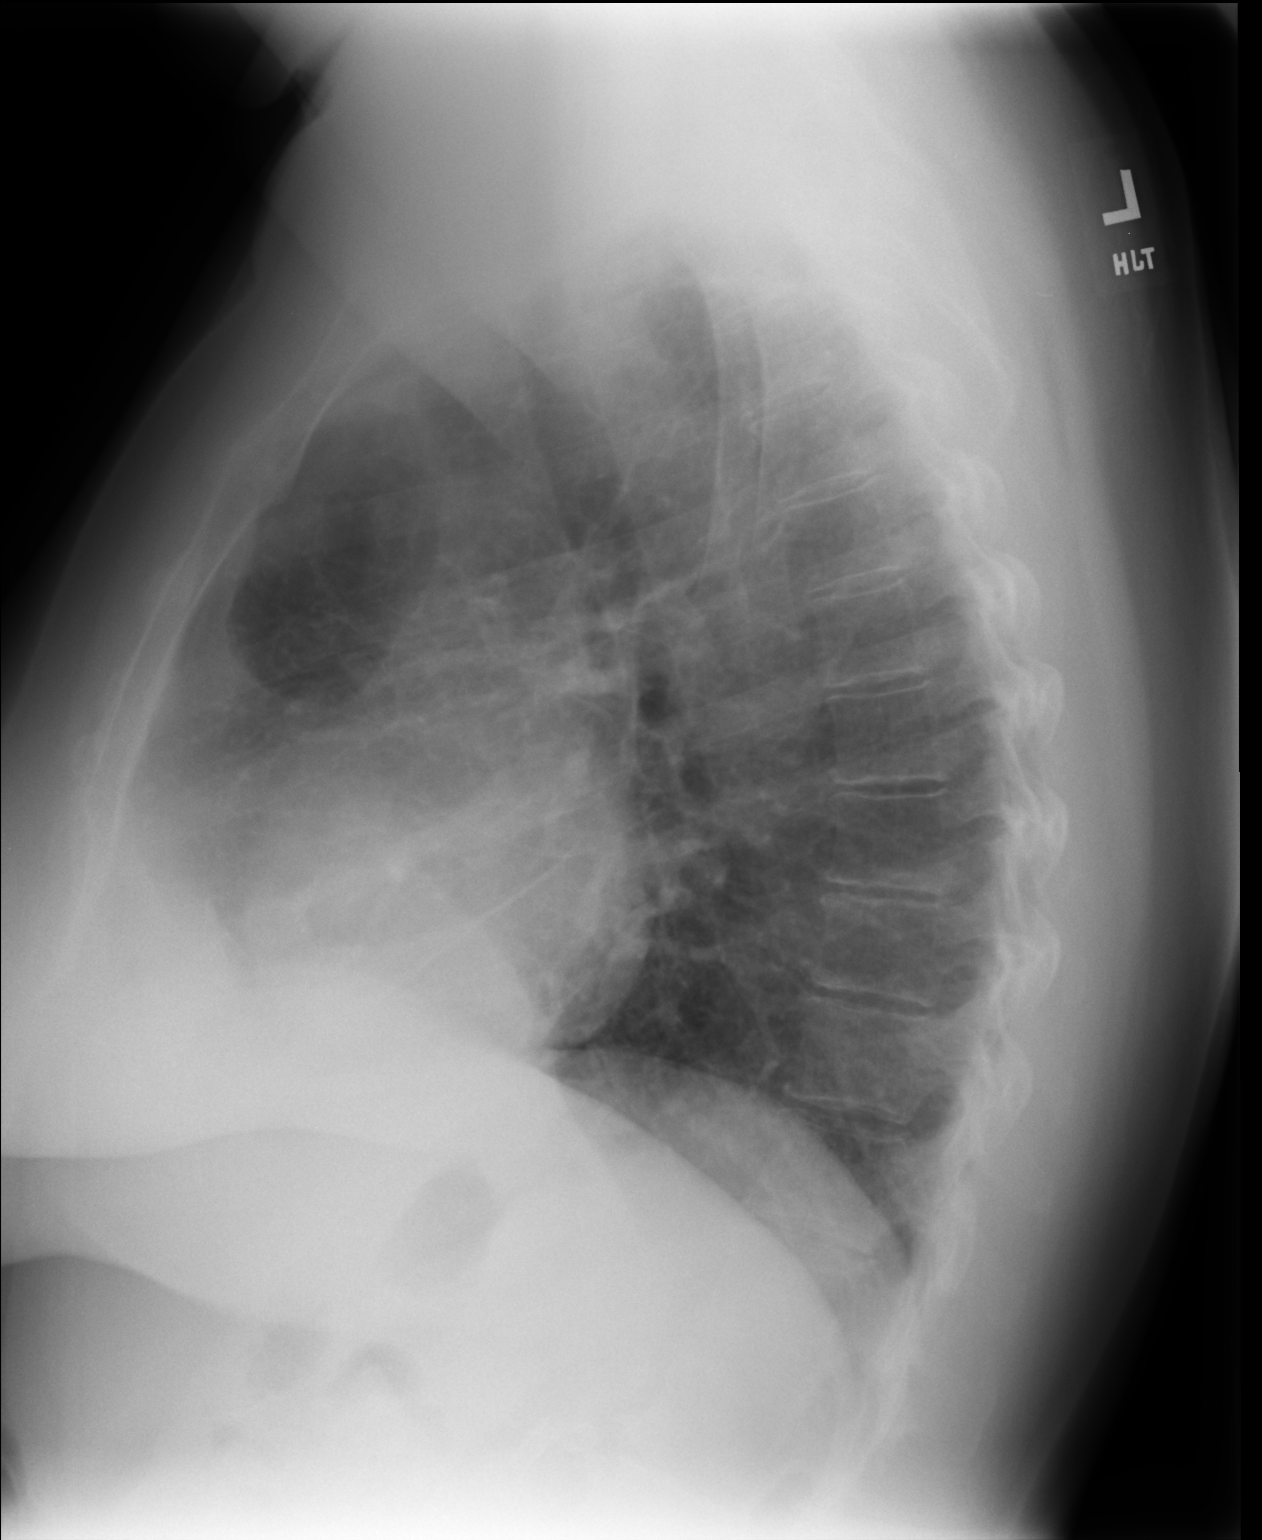

[2 of 2 positions shown; findings below may reference images not displayed]

FINDINGS: Cardiomediastinal silhouette is stable.  No acute
infiltrate or pleural effusion.  No pulmonary edema.  Mild
hyperinflation again noted.  Stable chronic mild interstitial
prominence.
IMPRESSION: No active disease.  No significant change.

Clinically significant discrepancy from primary report, if
provided: None

## 2013-10-28 ENCOUNTER — Encounter: Payer: Self-pay | Admitting: Vascular Surgery

## 2013-10-29 ENCOUNTER — Other Ambulatory Visit: Payer: Self-pay | Admitting: Cardiovascular Disease

## 2013-11-01 ENCOUNTER — Ambulatory Visit (HOSPITAL_COMMUNITY)
Admission: RE | Admit: 2013-11-01 | Discharge: 2013-11-01 | Disposition: A | Discharge status: Home / Self Care | Payer: Medicare Other | Source: Ambulatory Visit | Attending: Vascular Surgery | Admitting: Vascular Surgery

## 2013-11-01 ENCOUNTER — Ambulatory Visit (INDEPENDENT_AMBULATORY_CARE_PROVIDER_SITE_OTHER): Payer: Medicare Other | Admitting: Vascular Surgery

## 2013-11-01 ENCOUNTER — Other Ambulatory Visit: Payer: Self-pay

## 2013-11-01 ENCOUNTER — Encounter: Payer: Self-pay | Admitting: Vascular Surgery

## 2013-11-01 VITALS — BP 138/86 | HR 75 | Ht 63.0 in | Wt 213.0 lb

## 2013-11-01 DIAGNOSIS — I6529 Occlusion and stenosis of unspecified carotid artery: Secondary | ICD-10-CM

## 2013-11-01 DIAGNOSIS — Z48812 Encounter for surgical aftercare following surgery on the circulatory system: Secondary | ICD-10-CM

## 2013-11-01 NOTE — Addendum Note (Signed)
Addended by: Mena Goes on: 11/01/2013 01:49 PM   Modules accepted: Orders

## 2013-11-01 NOTE — Progress Notes (Signed)
Subjective:     Patient ID: Brenda [name], female   DOB: 26-Aug-1960, 53 y.o.   MRN: [id number]  HPI this 53 year old female returns for continued followup regarding her sherbet vascular disease. She is previously undergone bilateral carotid endarterectomies by Dr. Drucie Opitz most recently in 2009. She also had a right carotid to subclavian bypass and re\re anastomosis of right vertebral artery. Currently she denies any neurologic symptoms such as aphasia, amaurosis fugax, diplopia, blurred vision, syncope, or lateralizing weakness. She also does denies arm claudication symptoms. She states she has no claudication symptoms in the lower extremities. She is no longer on aspirin but is on a researcher drug which is a "blood thinner".  Past Medical History  Diagnosis Date  . HTN (hypertension)   . CAD (coronary artery disease)   . Carotid artery occlusion   . Renal artery stenosis   . PVD (peripheral vascular disease)   . Hypercholesteremia   . Cardiomyopathy   . COPD (chronic obstructive pulmonary disease)   . Pulmonary nodule   . Anemia   . Arthritis   . CHF (congestive heart failure) 2007    History  Substance Use Topics  . Smoking status: Former Smoker    Types: Cigarettes    Quit date: 06/09/2005  . Smokeless tobacco: Not on file  . Alcohol Use: No    Family History  Problem Relation Age of Onset  . Coronary artery disease    . Heart disease Mother 19    Heart Disease before age 83  . Hypertension Mother   . Hyperlipidemia Mother   . Peripheral vascular disease Mother   . Cancer Father   . Hyperlipidemia Father   . Hypertension Father     Allergies  Allergen Reactions  . Lisinopril     REACTION: D/C DUE TO COUGH/MC  . Oxycodone     Vomiting     Current outpatient prescriptions:carvedilol (COREG) 6.25 MG tablet, TAKE ONE TABLET BY MOUTH TWICE DAILY WITH MEALS, Disp: 60 tablet, Rfl: 3;  furosemide (LASIX) 20 MG tablet, TAKE ONE TABLET BY MOUTH EVERY OTHER DAY, Disp:  30 tablet, Rfl: 2;  KLOR-CON M20 20 MEQ tablet, TAKE ONE TABLET BY MOUTH EVERY OTHER DAY, Disp: 30 tablet, Rfl: 2;  losartan (COZAAR) 50 MG tablet, Take 1 tablet (50 mg total) by mouth daily., Disp: 90 tablet, Rfl: 3 NON FORMULARY, Take 1 tablet by mouth 2 (two) times daily. EUCLID STUDY DRUG-Plavix vs Brilinta, Disp: , Rfl: ;  simvastatin (ZOCOR) 40 MG tablet, TAKE ONE TABLET BY MOUTH ONCE DAILY, Disp: 30 tablet, Rfl: 3;  tiotropium (SPIRIVA HANDIHALER) 18 MCG inhalation capsule, Place 1 capsule (18 mcg total) into inhaler and inhale daily., Disp: 30 capsule, Rfl: 12  BP 138/86  Pulse 75  Ht 5\' 3"  (1.6 m)  Wt 213 lb (96.616 kg)  BMI 37.74 kg/m2  SpO2 94%  Body mass index is 37.74 kg/(m^2).           Review of Systems denies chest pain, dyspnea on exertion, PND, orthopnea, hemoptysis, lower extremity claudication. Does occasionally have leg cramps at rest but not with ambulation. Other systems negative and complete review of systems    Objective:   Physical Exam Gen.-alert and oriented x3 in no apparent distress.  BP 138/86  Pulse 75  Ht 5\' 3"  (1.6 m)  Wt 213 lb (96.616 kg)  BMI 37.74 kg/m2  SpO2 94%  HEENT normal for age Lungs no rhonchi or wheezing Cardiovascular regular rhythm no murmurs carotid  pulses 3+ palpable no bruits audible Abdomen soft nontender no palpable masses Musculoskeletal free of  major deformities Skin clear -no rashes Neurologic normal Lower extremities 3+ femoral and dorsalis pedis pulses palpable bilaterally with no edema  Today in our carotid duplex exam which I reviewed and interpreted. Both carotid endarterectomy sites are widely patent with no evidence of restenosis. There is antegrade flow in the right vertebral artery which was reimplanted. Left vertebral artery is not visualized. The carotid subclavian bypass on the right is widely patent. There is evidence of left subclavian occlusive disease with a 30 mm gradient right arm pressure better  than left       Assessment:     Widely patent bilateral carotid endarterectomies and carotid subclavian bypass on the right with reimplantation vertebral artery-patient asymptomatic Left subclavian occlusive disease-asymptomatic-does not need treatment    Plan:     Return in one year for followup carotid duplex exam

## 2013-11-24 ENCOUNTER — Encounter: Payer: Self-pay | Admitting: Cardiovascular Disease

## 2013-11-24 ENCOUNTER — Ambulatory Visit (INDEPENDENT_AMBULATORY_CARE_PROVIDER_SITE_OTHER): Payer: Medicare Other | Admitting: Cardiovascular Disease

## 2013-11-24 VITALS — BP 118/72 | HR 75 | Ht 63.0 in | Wt 212.8 lb

## 2013-11-24 DIAGNOSIS — E7800 Pure hypercholesterolemia, unspecified: Secondary | ICD-10-CM | POA: Diagnosis not present

## 2013-11-24 DIAGNOSIS — I701 Atherosclerosis of renal artery: Secondary | ICD-10-CM | POA: Diagnosis not present

## 2013-11-24 DIAGNOSIS — I2510 Atherosclerotic heart disease of native coronary artery without angina pectoris: Secondary | ICD-10-CM | POA: Diagnosis not present

## 2013-11-24 DIAGNOSIS — I70219 Atherosclerosis of native arteries of extremities with intermittent claudication, unspecified extremity: Secondary | ICD-10-CM

## 2013-11-24 DIAGNOSIS — I10 Essential (primary) hypertension: Secondary | ICD-10-CM

## 2013-11-24 LAB — BASIC METABOLIC PANEL
BUN: 7 mg/dL (ref 6–23)
CALCIUM: 9.4 mg/dL (ref 8.4–10.5)
CHLORIDE: 103 meq/L (ref 96–112)
CO2: 28 meq/L (ref 19–32)
Creatinine, Ser: 1 mg/dL (ref 0.4–1.2)
GFR: 63.27 mL/min (ref >60.00)
GLUCOSE: 95 mg/dL (ref 70–99)
Potassium: 3.7 mEq/L (ref 3.5–5.1)
SODIUM: 139 meq/L (ref 135–145)

## 2013-11-24 LAB — CBC
HCT: 44.9 % (ref 36.0–46.0)
Hemoglobin: 14.6 g/dL (ref 12.0–15.0)
MCHC: 32.6 g/dL (ref 30.0–36.0)
MCV: 92.5 fl (ref 78.0–100.0)
Platelets: 329 10*3/uL (ref 150.0–400.0)
RBC: 4.85 Mil/uL (ref 3.87–5.11)
RDW: 13.1 % (ref 11.5–15.5)
WBC: 10.8 10*3/uL — AB (ref 4.0–10.5)

## 2013-11-24 LAB — LIPID PANEL
CHOL/HDL RATIO: 4
Cholesterol: 156 mg/dL (ref 0–200)
HDL: 36.5 mg/dL — AB (ref >39.00)
LDL Cholesterol: 81 mg/dL (ref 0–99)
NonHDL: 119.5
Triglycerides: 191 mg/dL — ABNORMAL HIGH (ref 0.0–149.0)
VLDL: 38.2 mg/dL (ref 0.0–40.0)

## 2013-11-24 LAB — HEPATIC FUNCTION PANEL
ALBUMIN: 4 g/dL (ref 3.5–5.2)
ALK PHOS: 81 U/L (ref 39–117)
ALT: 18 U/L (ref 0–35)
AST: 15 U/L (ref 0–37)
BILIRUBIN DIRECT: 0 mg/dL (ref 0.0–0.3)
TOTAL PROTEIN: 7.6 g/dL (ref 6.0–8.3)
Total Bilirubin: 0.4 mg/dL (ref 0.2–1.2)

## 2013-11-24 LAB — HEMOGLOBIN A1C: HEMOGLOBIN A1C: 6.1 % (ref 4.6–6.5)

## 2013-11-24 NOTE — Patient Instructions (Signed)
Your physician recommends that you have lab work today: CBC, BMP, LIVER, LIPID, HgbA1c  Your physician has requested that you have a renal artery duplex in 1 YEAR. During this test, an ultrasound is used to evaluate blood flow to the kidneys. Allow one hour for this exam. Do not eat after midnight the day before and avoid carbonated beverages. Take your medications as you usually do.  Your physician has requested that you have a lower extremity arterial duplex in 1 YEAR. This test is an ultrasound of the arteries in the legs. It looks at arterial blood flow in the legs. Allow one hour for Lower Arterial scans. There are no restrictions or special instructions  Your physician wants you to follow-up in: 1 YEAR with Dr Burt Knack.  You will receive a reminder letter in the mail two months in advance. If you don't receive a letter, please call our office to schedule the follow-up appointment.

## 2013-11-24 NOTE — Progress Notes (Signed)
HPI:   53 year-old woman with severe extensive peripheral arterial disease. She's undergone carotid bypass, renal artery stenting, and bilateral iliac stenting. She has total occlusion of her superficial femoral arteries. She has significant stenotic disease of both subclavian and lower extremities, so it is always difficult to obtain a blood pressure. She was recently seen by Dr. Kellie Simmering with vascular surgery, and noted to have continued patency of her carotid bypass. She's had no stroke or TIA symptoms.  The patient is doing well. She denies chest pain, chest pressure, or shortness of breath. She's had no arm claudication symptoms. She does have stable claudication in both calves with walking. She reports no rest pain or ischemic ulceration. She's been compliant with her medications. She's not been following a good diet. Her friend is been concerned she is going to develop diabetes because of her poor eating habits.   Outpatient Encounter Prescriptions as of 11/24/2013  Medication Sig  . carvedilol (COREG) 6.25 MG tablet TAKE ONE TABLET BY MOUTH TWICE DAILY WITH MEALS  . furosemide (LASIX) 20 MG tablet TAKE ONE TABLET BY MOUTH EVERY OTHER DAY  . KLOR-CON M20 20 MEQ tablet TAKE ONE TABLET BY MOUTH EVERY OTHER DAY  . losartan (COZAAR) 50 MG tablet Take 1 tablet (50 mg total) by mouth daily.  . NON FORMULARY Take 1 tablet by mouth 2 (two) times daily. EUCLID STUDY DRUG-Plavix vs Brilinta  . simvastatin (ZOCOR) 40 MG tablet TAKE ONE TABLET BY MOUTH ONCE DAILY  . tiotropium (SPIRIVA HANDIHALER) 18 MCG inhalation capsule Place 1 capsule (18 mcg total) into inhaler and inhale daily.    Allergies  Allergen Reactions  . Lisinopril     REACTION: D/C DUE TO COUGH/MC  . Oxycodone     Vomiting     Past Medical History  Diagnosis Date  . HTN (hypertension)   . CAD (coronary artery disease)   . Carotid artery occlusion   . Renal artery stenosis   . PVD (peripheral vascular disease)   .  Hypercholesteremia   . Cardiomyopathy   . COPD (chronic obstructive pulmonary disease)   . Pulmonary nodule   . Anemia   . Arthritis   . CHF (congestive heart failure) 2007    ROS: Negative except as per HPI  BP 118/72  Pulse 75  Ht 5\' 3"  (1.6 m)  Wt 96.525 kg (212 lb 12.8 oz)  BMI 37.71 kg/m2  PHYSICAL EXAM: Pt is alert and oriented, pleasant obese woman in NAD HEENT: normal Neck: JVP - normal, carotids 2+= with bilateral bruits Lungs: CTA bilaterally CV: RRR with a paradoxical S2 Abd: soft, NT, Positive BS, no hepatomegaly Ext: no C/C/E, pedal pulses are nonpalpable Skin: warm/dry no rash  EKG:  Sinus rhythm with left bundle branch block, heart rate 75 beats per minute.  ASSESSMENT AND PLAN: 1. Lower extremity peripheral arterial disease with intermittent claudication. Last Doppler studies shows stable findings. Her ABIs are inaccurate because of occlusive disease in all 4 extremities. Waveforms were reviewed. She has not SFA occlusions. Fortunately she is stable from a clinical perspective.  2. Hypertension. She will continue on carvedilol and losartan.  3. Hyperlipidemia. She continues on simvastatin. Will repeat lipids and LFTs. Last lipids: Lipid Panel     Component Value Date/Time   CHOL 146 10/25/2012 0756   TRIG 154.0* 10/25/2012 0756   HDL 31.20* 10/25/2012 0756   CHOLHDL 5 10/25/2012 0756   VLDL 30.8 10/25/2012 0756   LDLCALC 84 10/25/2012 0756  4. Renal artery stenosis status post multiple PTA and stenting procedures. Most recent duplex reviewed which demonstrated moderate restenosis bilaterally. Her kidney size remains stable bilaterally. Will check lab work to evaluate renal function. Repeat Doppler studies next year.  I will see her back in one year. Will update labs. Reviewed the importance of weight loss and dietary modification.  Sherren Mocha 11/24/2013 1:43 PM

## 2014-01-30 ENCOUNTER — Telehealth: Payer: Self-pay | Admitting: Cardiovascular Disease

## 2014-01-30 NOTE — Telephone Encounter (Signed)
New problem:     Pt needs a call back  to discuss drinking and having a letter saying she can not drink.   If any questions give her a call.

## 2014-01-31 NOTE — Telephone Encounter (Signed)
I spoke with the pt and she is going on a cruise with one of her girlfriends. They will be staying in the same cabin and both have to purchase a beer and liquor package on this cruise.  The pt does not drink and she has been advised that if she has a note from her physician stating that she cannot drink then they will waive the cost of this package for the patient (friend is Personal assistant). The pt would like the letter mailed to her home.

## 2014-02-01 NOTE — Telephone Encounter (Signed)
Letter completed and mailed to the pt.

## 2014-02-27 ENCOUNTER — Other Ambulatory Visit: Payer: Self-pay | Admitting: Cardiovascular Disease

## 2014-05-11 ENCOUNTER — Other Ambulatory Visit: Payer: Self-pay | Admitting: Cardiovascular Disease

## 2014-06-19 ENCOUNTER — Other Ambulatory Visit: Payer: Self-pay | Admitting: Cardiovascular Disease

## 2014-08-07 ENCOUNTER — Other Ambulatory Visit: Payer: Self-pay | Admitting: Cardiovascular Disease

## 2014-09-25 ENCOUNTER — Other Ambulatory Visit (HOSPITAL_COMMUNITY): Payer: Self-pay | Admitting: Cardiovascular Disease

## 2014-09-25 DIAGNOSIS — I701 Atherosclerosis of renal artery: Secondary | ICD-10-CM

## 2014-09-25 DIAGNOSIS — IMO0002 Reserved for concepts with insufficient information to code with codable children: Secondary | ICD-10-CM

## 2014-09-25 DIAGNOSIS — I739 Peripheral vascular disease, unspecified: Secondary | ICD-10-CM

## 2014-10-17 ENCOUNTER — Other Ambulatory Visit (HOSPITAL_COMMUNITY): Payer: Self-pay | Admitting: Cardiovascular Disease

## 2014-10-17 DIAGNOSIS — I10 Essential (primary) hypertension: Secondary | ICD-10-CM

## 2014-10-17 DIAGNOSIS — I701 Atherosclerosis of renal artery: Secondary | ICD-10-CM

## 2014-10-18 ENCOUNTER — Other Ambulatory Visit: Payer: Self-pay | Admitting: *Deleted

## 2014-10-18 ENCOUNTER — Encounter (HOSPITAL_COMMUNITY): Payer: Medicare Other

## 2014-10-18 ENCOUNTER — Ambulatory Visit (HOSPITAL_BASED_OUTPATIENT_CLINIC_OR_DEPARTMENT_OTHER): Payer: Medicare Other

## 2014-10-18 ENCOUNTER — Ambulatory Visit (HOSPITAL_COMMUNITY): Payer: Medicare Other | Attending: Cardiovascular Disease

## 2014-10-18 DIAGNOSIS — I739 Peripheral vascular disease, unspecified: Secondary | ICD-10-CM | POA: Diagnosis not present

## 2014-10-18 DIAGNOSIS — I10 Essential (primary) hypertension: Secondary | ICD-10-CM | POA: Diagnosis not present

## 2014-10-18 DIAGNOSIS — Z9889 Other specified postprocedural states: Secondary | ICD-10-CM | POA: Diagnosis not present

## 2014-10-18 DIAGNOSIS — I701 Atherosclerosis of renal artery: Secondary | ICD-10-CM

## 2014-10-18 MED ORDER — TICAGRELOR 60 MG PO TABS: 60.0000 mg | ORAL_TABLET | Freq: Two times a day (BID) | ORAL | Status: DC

## 2014-10-27 ENCOUNTER — Other Ambulatory Visit: Payer: Self-pay

## 2014-10-27 DIAGNOSIS — J449 Chronic obstructive pulmonary disease, unspecified: Secondary | ICD-10-CM

## 2014-10-27 MED ORDER — TIOTROPIUM BROMIDE MONOHYDRATE 18 MCG IN CAPS: 18.0000 ug | ORAL_CAPSULE | Freq: Every day | RESPIRATORY_TRACT | Status: DC

## 2014-10-31 ENCOUNTER — Encounter (HOSPITAL_COMMUNITY): Payer: Self-pay | Admitting: Emergency Medicine

## 2014-10-31 ENCOUNTER — Inpatient Hospital Stay (HOSPITAL_COMMUNITY)
Admission: EM | Admit: 2014-10-31 | Discharge: 2014-11-02 | DRG: 189 | Disposition: A | Discharge status: Home / Self Care | Payer: Medicare Other | Attending: Internal Medicine | Admitting: Internal Medicine

## 2014-10-31 ENCOUNTER — Encounter (HOSPITAL_COMMUNITY): Payer: Medicare Other

## 2014-10-31 ENCOUNTER — Emergency Department (HOSPITAL_COMMUNITY): Payer: Medicare Other

## 2014-10-31 ENCOUNTER — Inpatient Hospital Stay (HOSPITAL_COMMUNITY): Payer: Medicare Other

## 2014-10-31 DIAGNOSIS — E780 Pure hypercholesterolemia: Secondary | ICD-10-CM | POA: Diagnosis present

## 2014-10-31 DIAGNOSIS — R739 Hyperglycemia, unspecified: Secondary | ICD-10-CM | POA: Diagnosis not present

## 2014-10-31 DIAGNOSIS — R0602 Shortness of breath: Secondary | ICD-10-CM | POA: Diagnosis not present

## 2014-10-31 DIAGNOSIS — Z87891 Personal history of nicotine dependence: Secondary | ICD-10-CM | POA: Diagnosis not present

## 2014-10-31 DIAGNOSIS — I509 Heart failure, unspecified: Secondary | ICD-10-CM | POA: Diagnosis present

## 2014-10-31 DIAGNOSIS — N390 Urinary tract infection, site not specified: Secondary | ICD-10-CM | POA: Diagnosis present

## 2014-10-31 DIAGNOSIS — I739 Peripheral vascular disease, unspecified: Secondary | ICD-10-CM | POA: Diagnosis present

## 2014-10-31 DIAGNOSIS — D649 Anemia, unspecified: Secondary | ICD-10-CM | POA: Diagnosis present

## 2014-10-31 DIAGNOSIS — B961 Klebsiella pneumoniae [K. pneumoniae] as the cause of diseases classified elsewhere: Secondary | ICD-10-CM | POA: Diagnosis present

## 2014-10-31 DIAGNOSIS — J020 Streptococcal pharyngitis: Secondary | ICD-10-CM | POA: Diagnosis present

## 2014-10-31 DIAGNOSIS — E860 Dehydration: Secondary | ICD-10-CM | POA: Diagnosis present

## 2014-10-31 DIAGNOSIS — M1990 Unspecified osteoarthritis, unspecified site: Secondary | ICD-10-CM | POA: Diagnosis present

## 2014-10-31 DIAGNOSIS — I701 Atherosclerosis of renal artery: Secondary | ICD-10-CM | POA: Diagnosis present

## 2014-10-31 DIAGNOSIS — I10 Essential (primary) hypertension: Secondary | ICD-10-CM | POA: Diagnosis present

## 2014-10-31 DIAGNOSIS — R079 Chest pain, unspecified: Secondary | ICD-10-CM

## 2014-10-31 DIAGNOSIS — I429 Cardiomyopathy, unspecified: Secondary | ICD-10-CM | POA: Diagnosis present

## 2014-10-31 DIAGNOSIS — K5900 Constipation, unspecified: Secondary | ICD-10-CM | POA: Diagnosis present

## 2014-10-31 DIAGNOSIS — I5032 Chronic diastolic (congestive) heart failure: Secondary | ICD-10-CM | POA: Diagnosis not present

## 2014-10-31 DIAGNOSIS — I2510 Atherosclerotic heart disease of native coronary artery without angina pectoris: Secondary | ICD-10-CM | POA: Diagnosis present

## 2014-10-31 DIAGNOSIS — Z888 Allergy status to other drugs, medicaments and biological substances status: Secondary | ICD-10-CM | POA: Diagnosis not present

## 2014-10-31 DIAGNOSIS — J9601 Acute respiratory failure with hypoxia: Principal | ICD-10-CM | POA: Diagnosis present

## 2014-10-31 DIAGNOSIS — Z955 Presence of coronary angioplasty implant and graft: Secondary | ICD-10-CM

## 2014-10-31 DIAGNOSIS — J449 Chronic obstructive pulmonary disease, unspecified: Secondary | ICD-10-CM | POA: Diagnosis not present

## 2014-10-31 DIAGNOSIS — D751 Secondary polycythemia: Secondary | ICD-10-CM | POA: Diagnosis present

## 2014-10-31 DIAGNOSIS — R509 Fever, unspecified: Secondary | ICD-10-CM | POA: Diagnosis not present

## 2014-10-31 DIAGNOSIS — J441 Chronic obstructive pulmonary disease with (acute) exacerbation: Secondary | ICD-10-CM | POA: Diagnosis present

## 2014-10-31 DIAGNOSIS — R000 Tachycardia, unspecified: Secondary | ICD-10-CM | POA: Diagnosis not present

## 2014-10-31 LAB — CBC WITH DIFFERENTIAL/PLATELET
Basophils Absolute: 0.1 10*3/uL (ref 0.0–0.1)
Basophils Relative: 0 % (ref 0–1)
EOS ABS: 0.1 10*3/uL (ref 0.0–0.7)
EOS PCT: 1 % (ref 0–5)
HEMATOCRIT: 44.9 % (ref 36.0–46.0)
HEMOGLOBIN: 15.4 g/dL — AB (ref 12.0–15.0)
LYMPHS ABS: 1.1 10*3/uL (ref 0.7–4.0)
Lymphocytes Relative: 6 % — ABNORMAL LOW (ref 12–46)
MCH: 31.4 pg (ref 26.0–34.0)
MCHC: 34.3 g/dL (ref 30.0–36.0)
MCV: 91.6 fL (ref 78.0–100.0)
MONO ABS: 0.8 10*3/uL (ref 0.1–1.0)
MONOS PCT: 5 % (ref 3–12)
NEUTROS ABS: 15 10*3/uL — AB (ref 1.7–7.7)
Neutrophils Relative %: 88 % — ABNORMAL HIGH (ref 43–77)
PLATELETS: 322 10*3/uL (ref 150–400)
RBC: 4.9 MIL/uL (ref 3.87–5.11)
RDW: 13.1 % (ref 11.5–15.5)
WBC: 17 10*3/uL — ABNORMAL HIGH (ref 4.0–10.5)

## 2014-10-31 LAB — TROPONIN I
TROPONIN I: 0.03 ng/mL (ref <0.031)
Troponin I: <0.03 ng/mL (ref <0.031)
Troponin I: <0.03 ng/mL (ref <0.031)

## 2014-10-31 LAB — I-STAT TROPONIN, ED: Troponin i, poc: 0 ng/mL (ref 0.00–0.08)

## 2014-10-31 LAB — BASIC METABOLIC PANEL
Anion gap: 12 (ref 5–15)
BUN: 10 mg/dL (ref 6–20)
CO2: 22 mmol/L (ref 22–32)
Calcium: 9.4 mg/dL (ref 8.9–10.3)
Chloride: 102 mmol/L (ref 101–111)
Creatinine, Ser: 1.05 mg/dL — ABNORMAL HIGH (ref 0.44–1.00)
GFR calc Af Amer: >60 mL/min (ref >60)
GFR, EST NON AFRICAN AMERICAN: 60 mL/min — AB (ref >60)
Glucose, Bld: 121 mg/dL — ABNORMAL HIGH (ref 65–99)
POTASSIUM: 4 mmol/L (ref 3.5–5.1)
SODIUM: 136 mmol/L (ref 135–145)

## 2014-10-31 LAB — D-DIMER, QUANTITATIVE (NOT AT ARMC): D DIMER QUANT: 0.48 ug{FEU}/mL (ref 0.00–0.48)

## 2014-10-31 LAB — URINE MICROSCOPIC-ADD ON

## 2014-10-31 LAB — URINALYSIS, ROUTINE W REFLEX MICROSCOPIC
Glucose, UA: NEGATIVE mg/dL
Ketones, ur: 15 mg/dL — AB
Leukocytes, UA: NEGATIVE
NITRITE: NEGATIVE
PROTEIN: 100 mg/dL — AB
Specific Gravity, Urine: 1.029 (ref 1.005–1.030)
Urobilinogen, UA: 1 mg/dL (ref 0.0–1.0)
pH: 6 (ref 5.0–8.0)

## 2014-10-31 LAB — BRAIN NATRIURETIC PEPTIDE: B Natriuretic Peptide: 79.6 pg/mL (ref 0.0–100.0)

## 2014-10-31 LAB — RAPID STREP SCREEN (MED CTR MEBANE ONLY): Streptococcus, Group A Screen (Direct): POSITIVE — AB

## 2014-10-31 MED ORDER — ENOXAPARIN SODIUM 40 MG/0.4ML ~~LOC~~ SOLN
40.0000 mg | SUBCUTANEOUS | Status: DC
  Filled 2014-10-31 (×4): qty 0.4

## 2014-10-31 MED ORDER — PENICILLIN G BENZATHINE 1200000 UNIT/2ML IM SUSP
1.2000 10*6.[IU] | Freq: Once | INTRAMUSCULAR | Status: AC
  Administered 2014-10-31: 1.2 10*6.[IU] via INTRAMUSCULAR
  Filled 2014-10-31: qty 2

## 2014-10-31 MED ORDER — IPRATROPIUM-ALBUTEROL 0.5-2.5 (3) MG/3ML IN SOLN
RESPIRATORY_TRACT | Status: AC
  Filled 2014-10-31: qty 3

## 2014-10-31 MED ORDER — SODIUM CHLORIDE 0.9 % IJ SOLN: 3.0000 mL | INTRAMUSCULAR | Status: DC | PRN

## 2014-10-31 MED ORDER — ALBUTEROL SULFATE HFA 108 (90 BASE) MCG/ACT IN AERS
1.0000 | INHALATION_SPRAY | RESPIRATORY_TRACT | Status: DC | PRN
  Filled 2014-10-31: qty 6.7

## 2014-10-31 MED ORDER — SODIUM CHLORIDE 0.9 % IJ SOLN
3.0000 mL | Freq: Two times a day (BID) | INTRAMUSCULAR | Status: DC
  Administered 2014-10-31 – 2014-11-01 (×3): 3 mL via INTRAVENOUS

## 2014-10-31 MED ORDER — PREDNISONE 20 MG PO TABS
60.0000 mg | ORAL_TABLET | Freq: Once | ORAL | Status: AC
  Administered 2014-10-31: 60 mg via ORAL
  Filled 2014-10-31: qty 3

## 2014-10-31 MED ORDER — METHYLPREDNISOLONE SODIUM SUCC 125 MG IJ SOLR
80.0000 mg | Freq: Three times a day (TID) | INTRAMUSCULAR | Status: DC
  Administered 2014-10-31 – 2014-11-02 (×6): 80 mg via INTRAVENOUS
  Filled 2014-10-31: qty 2
  Filled 2014-10-31: qty 1.28
  Filled 2014-10-31: qty 2
  Filled 2014-10-31 (×3): qty 1.28
  Filled 2014-10-31: qty 2
  Filled 2014-10-31 (×4): qty 1.28

## 2014-10-31 MED ORDER — TIOTROPIUM BROMIDE MONOHYDRATE 18 MCG IN CAPS
18.0000 ug | ORAL_CAPSULE | Freq: Every day | RESPIRATORY_TRACT | Status: DC
  Administered 2014-10-31 – 2014-11-02 (×3): 18 ug via RESPIRATORY_TRACT
  Filled 2014-10-31: qty 5

## 2014-10-31 MED ORDER — ACETAMINOPHEN 500 MG PO TABS
1000.0000 mg | ORAL_TABLET | Freq: Once | ORAL | Status: AC
Start: 2014-10-31 — End: 2014-10-31
  Administered 2014-10-31: 1000 mg via ORAL
  Filled 2014-10-31: qty 2

## 2014-10-31 MED ORDER — CARVEDILOL 6.25 MG PO TABS
6.2500 mg | ORAL_TABLET | Freq: Two times a day (BID) | ORAL | Status: DC
  Administered 2014-11-01 – 2014-11-02 (×3): 6.25 mg via ORAL
  Filled 2014-10-31 (×5): qty 1

## 2014-10-31 MED ORDER — SIMVASTATIN 40 MG PO TABS
40.0000 mg | ORAL_TABLET | Freq: Every day | ORAL | Status: DC
  Administered 2014-10-31 – 2014-11-02 (×3): 40 mg via ORAL
  Filled 2014-10-31 (×3): qty 1

## 2014-10-31 MED ORDER — FUROSEMIDE 20 MG PO TABS
20.0000 mg | ORAL_TABLET | ORAL | Status: DC
  Administered 2014-11-01: 20 mg via ORAL
  Filled 2014-10-31: qty 1

## 2014-10-31 MED ORDER — TICAGRELOR 60 MG PO TABS
60.0000 mg | ORAL_TABLET | Freq: Two times a day (BID) | ORAL | Status: DC
  Administered 2014-10-31 – 2014-11-02 (×5): 60 mg via ORAL
  Filled 2014-10-31 (×6): qty 1

## 2014-10-31 MED ORDER — IPRATROPIUM-ALBUTEROL 0.5-2.5 (3) MG/3ML IN SOLN
3.0000 mL | Freq: Once | RESPIRATORY_TRACT | Status: AC
  Administered 2014-10-31: 3 mL via RESPIRATORY_TRACT
  Filled 2014-10-31: qty 3

## 2014-10-31 MED ORDER — ALBUTEROL SULFATE (2.5 MG/3ML) 0.083% IN NEBU
2.5000 mg | INHALATION_SOLUTION | Freq: Four times a day (QID) | RESPIRATORY_TRACT | Status: DC
  Administered 2014-10-31: 2.5 mg via RESPIRATORY_TRACT
  Filled 2014-10-31: qty 3

## 2014-10-31 MED ORDER — LEVOFLOXACIN IN D5W 500 MG/100ML IV SOLN
500.0000 mg | INTRAVENOUS | Status: DC
  Administered 2014-10-31 – 2014-11-02 (×3): 500 mg via INTRAVENOUS
  Filled 2014-10-31 (×3): qty 100

## 2014-10-31 MED ORDER — POTASSIUM CHLORIDE CRYS ER 20 MEQ PO TBCR
20.0000 meq | EXTENDED_RELEASE_TABLET | ORAL | Status: DC
  Administered 2014-11-01: 20 meq via ORAL
  Filled 2014-10-31: qty 1

## 2014-10-31 MED ORDER — CARVEDILOL 6.25 MG PO TABS
6.2500 mg | ORAL_TABLET | Freq: Two times a day (BID) | ORAL | Status: DC
  Administered 2014-10-31 (×2): 6.25 mg via ORAL
  Filled 2014-10-31 (×3): qty 1

## 2014-10-31 MED ORDER — SODIUM CHLORIDE 0.9 % IV SOLN: 250.0000 mL | INTRAVENOUS | Status: DC | PRN

## 2014-10-31 MED ORDER — SODIUM CHLORIDE 0.9 % IJ SOLN
3.0000 mL | Freq: Two times a day (BID) | INTRAMUSCULAR | Status: DC
  Administered 2014-10-31 – 2014-11-02 (×4): 3 mL via INTRAVENOUS

## 2014-10-31 MED ORDER — IPRATROPIUM-ALBUTEROL 0.5-2.5 (3) MG/3ML IN SOLN
3.0000 mL | Freq: Once | RESPIRATORY_TRACT | Status: AC
  Administered 2014-10-31: 3 mL via RESPIRATORY_TRACT

## 2014-10-31 MED ORDER — ALBUTEROL SULFATE (2.5 MG/3ML) 0.083% IN NEBU: 2.5000 mg | INHALATION_SOLUTION | Freq: Four times a day (QID) | RESPIRATORY_TRACT | Status: DC | PRN

## 2014-10-31 MED ORDER — LOSARTAN POTASSIUM 50 MG PO TABS
50.0000 mg | ORAL_TABLET | Freq: Every day | ORAL | Status: DC
  Administered 2014-10-31 – 2014-11-02 (×3): 50 mg via ORAL
  Filled 2014-10-31 (×3): qty 1

## 2014-10-31 MED ORDER — ONDANSETRON 4 MG PO TBDP
8.0000 mg | ORAL_TABLET | Freq: Once | ORAL | Status: AC
  Administered 2014-10-31: 8 mg via ORAL
  Filled 2014-10-31: qty 2

## 2014-10-31 MED ORDER — ALBUTEROL SULFATE (2.5 MG/3ML) 0.083% IN NEBU: 2.5000 mg | INHALATION_SOLUTION | RESPIRATORY_TRACT | Status: DC | PRN

## 2014-10-31 NOTE — ED Provider Notes (Signed)
CSN: [id number]     Arrival date & time 10/31/14  0009 History  This chart was scribed for Linton Flemings, MD by Molli Posey, ED Scribe. This patient was seen in room A02C/A02C and the patient's care was started 1:03 AM.   Chief Complaint  Patient presents with  . Shortness of Breath    The patient said she had a fever 101 at home, then she said she had chills and she says she cannot breathe.  The patient said she has a sore throat as well.  . Sore Throat  . Fever   The history is provided by the patient. No language interpreter was used.   HPI Comments: Brenda [name] is a 54 y.o. female with a history of HTN, CAD, CHF and COPD who presents to the Emergency Department complaining of a fever that started at No Name last night. Pt reports that she is experiencing generalized abdominal pain, sore throat, SOB, wheezing and chills as well. She says that swallowing exacerbates her sore throat pain. Pt states that she is experiencing mild constipation as well and reports her last BM was yesterday afternoon. She states she has taken no medications PTA. She states that it has been "a while" since she has had an inhaler for her COPD. She denies any known sick contacts. Pt denies diarrhea, rhinorrhea, congestion or vomiting.   Past Medical History  Diagnosis Date  . HTN (hypertension)   . CAD (coronary artery disease)   . Carotid artery occlusion   . Renal artery stenosis   . PVD (peripheral vascular disease)   . Hypercholesteremia   . Cardiomyopathy   . COPD (chronic obstructive pulmonary disease)   . Pulmonary nodule   . Anemia   . Arthritis   . CHF (congestive heart failure) 2007   Past Surgical History  Procedure Laterality Date  . Cardiac catheterization    . Gallbladder surgery    . External ear surgery      as a child  . Coronary stent placement       Successful stenting of the right renal ostium   complicated by distal wire perforation.  The wire perforation was  treated with  embolization of the branch vessels involved.  Will check CT   scan to assess the right renal parenchymal hematoma.  She will be   maintained on both aspirin and Plavix.  Will place a Foley catheter to   avoid clotting since hematuria is likely.     Family History  Problem Relation Age of Onset  . Coronary artery disease    . Heart disease Mother 35    Heart Disease before age 56  . Hypertension Mother   . Hyperlipidemia Mother   . Peripheral vascular disease Mother   . Cancer Father   . Hyperlipidemia Father   . Hypertension Father    History  Substance Use Topics  . Smoking status: Former Smoker    Types: Cigarettes    Quit date: 06/09/2005  . Smokeless tobacco: Not on file  . Alcohol Use: No   OB History    No data available     Review of Systems  Constitutional: Positive for fever and chills.  HENT: Negative for congestion and rhinorrhea.   Respiratory: Positive for shortness of breath and wheezing.   Gastrointestinal: Positive for abdominal pain and constipation. Negative for vomiting and diarrhea.  All other systems reviewed and are negative.  Allergies  Lisinopril and Oxycodone  Home Medications   Prior to Admission  medications   Medication Sig Start Date End Date Taking? Authorizing Provider  acetaminophen (TYLENOL) 500 MG tablet Take 500 mg by mouth every 6 (six) hours as needed for mild pain.   Yes Historical Provider, MD  carvedilol (COREG) 6.25 MG tablet TAKE 1 TABLET BY MOUTH TWICE DAILY WITH MEALS 08/08/14  Yes Sherren Mocha, MD  furosemide (LASIX) 20 MG tablet TAKE ONE TABLET BY MOUTH EVERY OTHER DAY 05/12/14  Yes Sherren Mocha, MD  KLOR-CON M20 20 MEQ tablet TAKE ONE TABLET BY MOUTH EVERY OTHER DAY 06/19/14  Yes Sherren Mocha, MD  losartan (COZAAR) 50 MG tablet Take 1 tablet (50 mg total) by mouth daily. 09/06/13  Yes Sherren Mocha, MD  simvastatin (ZOCOR) 40 MG tablet TAKE ONE TABLET BY MOUTH ONCE DAILY 05/12/14  Yes Sherren Mocha, MD  ticagrelor  (BRILINTA) 60 MG TABS tablet Take 1 tablet (60 mg total) by mouth 2 (two) times daily. 10/18/14  Yes Sherren Mocha, MD  tiotropium (SPIRIVA HANDIHALER) 18 MCG inhalation capsule Place 1 capsule (18 mcg total) into inhaler and inhale daily. 10/27/14  Yes Sherren Mocha, MD   BP 117/88 mmHg  Pulse 114  Temp(Src) 99.5 F (37.5 C) (Oral)  Resp 28  SpO2 93% Physical Exam  Constitutional: She is oriented to person, place, and time. She appears well-developed and well-nourished.  HENT:  Head: Normocephalic and atraumatic.  Nose: Nose normal.  Mouth/Throat: Oropharynx is clear and moist.  Scarring of TMs bilaterally.  No effusion, no erythema  Eyes: Conjunctivae and EOM are normal. Pupils are equal, round, and reactive to light.  Neck: Normal range of motion. Neck supple. No JVD present. No tracheal deviation present. No thyromegaly present.  Cardiovascular: Normal rate, regular rhythm and intact distal pulses.  Exam reveals no gallop and no friction rub.   Murmur heard. Pulmonary/Chest: Effort normal. No stridor. No respiratory distress. She has wheezes ( trace wheeze). She has no rales. She exhibits no tenderness.  Abdominal: Soft. Bowel sounds are normal. She exhibits no distension and no mass. There is no tenderness. There is no rebound and no guarding.  Musculoskeletal: Normal range of motion. She exhibits no edema or tenderness.  Lymphadenopathy:    She has no cervical adenopathy.  Neurological: She is alert and oriented to person, place, and time. She displays normal reflexes. She exhibits normal muscle tone. Coordination normal.  Skin: Skin is warm and dry. No rash noted. No erythema. No pallor.  Psychiatric: She has a normal mood and affect. Her behavior is normal. Judgment and thought content normal.  Nursing note and vitals reviewed.   ED Course  Procedures   DIAGNOSTIC STUDIES: Oxygen Saturation is 93% on RA, normal by my interpretation.    COORDINATION OF CARE: 1:09 AM  Discussed treatment plan with pt at bedside and pt agreed to plan.   Labs Review Labs Reviewed  BASIC METABOLIC PANEL - Abnormal; Notable for the following:    Glucose, Bld 121 (*)    Creatinine, Ser 1.05 (*)    GFR calc non Af Amer 60 (*)    All other components within normal limits  CBC WITH DIFFERENTIAL/PLATELET - Abnormal; Notable for the following:    WBC 17.0 (*)    Hemoglobin 15.4 (*)    Neutrophils Relative % 88 (*)    Neutro Abs 15.0 (*)    Lymphocytes Relative 6 (*)    All other components within normal limits  RAPID STREP SCREEN  BRAIN NATRIURETIC PEPTIDE  I-STAT TROPOININ, ED  Imaging Review Dg Chest 2 View (if Patient Has Fever And/or Copd)  10/31/2014   CLINICAL DATA:  Shortness of breath, sore throat, fever  EXAM: CHEST  2 VIEW  COMPARISON:  11/22/2012  FINDINGS: Hyperinflation, apical emphysema, and diffuse interstitial coarsening. No focal opacity to suggest pneumonia. No edema, effusion, or pneumothorax. Normal heart size when accounting for lower mediastinal fat. Negative aortic hilar contours.  IMPRESSION: COPD without acute superimposed disease.   Electronically Signed   By: Monte Fantasia M.D.   On: 10/31/2014 00:59     EKG Interpretation   Date/Time:  Tuesday Oct 31 2014 00:14:52 EDT Ventricular Rate:  114 PR Interval:  170 QRS Duration: 144 QT Interval:  346 QTC Calculation: 476 R Axis:   28 Text Interpretation:  Sinus tachycardia Left bundle branch block Abnormal  ECG Confirmed by Zitlali Primm  MD, Odas Ozer ([id number]) on 10/31/2014 12:17:28 AM      MDM   Final diagnoses:  Strep throat  COPD exacerbation    I personally performed the services described in this documentation, which was scribed in my presence. The recorded information has been reviewed and is accurate.  54 year old female with fever starting today, sore throat, shortness of breath.  Patient has history of COPD, and congestive heart failure, peripheral and central vascular disease.  Patient  with headache and diffuse abdominal pain as well without nausea, vomiting, diarrhea.  She has leukocytosis.  No recent sick contacts.  Chest x-ray without pneumonia.  Wheezing has resolved after one neb treatment.  Posterior pharynx erythematous without exudate.   4:37 AM Was preparing to discharge patient after treatment for her strep throat, the patient has had persistent hypoxia and oxygen requirement.  Suspect that she has a COPD exacerbation may be triggered by her strep infection.  Patient to be admitted to the hospital.   Linton Flemings, MD 10/31/14 [phone]

## 2014-10-31 NOTE — ED Notes (Signed)
The patient said she had a fever 101 at home, then she said she had chills and she says she cannot breathe.  The patient said she has a sore throat as well.  The patient said she has not taken her medicine for high blood pressure or any other medications.  She denies any pain.

## 2014-10-31 NOTE — Progress Notes (Signed)
Pt arrived to floor, VSS. Pt had order for heart healthy diet, d/c by ED order for NPO? MD paged to clarify. Pt A/Ox4, steady on feet with standby assist, no pain.

## 2014-10-31 NOTE — ED Notes (Signed)
Report attempted 

## 2014-10-31 NOTE — Progress Notes (Signed)
As seen and assessed patient and agree with Dr. Julianne Rice assessment and plan. Vision presenting with fever and chills noted to have positive strep. Patient given IM penicillin. Patient also being treated for COPD exacerbation with clinical improvement. Patient at present complains of chest tightness likely secondary to acute COPD exacerbation. Cardiac enzymes are negative 3. 2-D echo with EF of 65-70% with no wall motion abnormalities. Patient with a leukocytosis. Will check a UA with cultures and sensitivities. Chest x-ray is negative for any acute infiltrates. Check blood cultures 2. Continue IV Levaquin, IV Solu-Medrol taper, nebulizer treatments.

## 2014-10-31 NOTE — H&P (Addendum)
Brenda [name] is an 54 y.o. female.   Pcp:  unassignecd  Chief Complaint:   Sore throat HPI: 54 yo female with Copd apparently had the chills at about 5pm, and at 11 pm c/o fever.  And therefore presented to ED for evaluation for dyspnea, and chest tightness.  Pt thought that she might have chf, and therefore presented to ED for evaluation.  Pt was noted on CXR to have Copd, and normal bnp.  Pt + for strep throat,  Pox slightly low at 87% and therefore ED requested that we admit the patient for Copd exacerbation, and strep throat.   Past Medical History  Diagnosis Date  . HTN (hypertension)   . CAD (coronary artery disease)   . Carotid artery occlusion   . Renal artery stenosis   . PVD (peripheral vascular disease)   . Hypercholesteremia   . Cardiomyopathy   . COPD (chronic obstructive pulmonary disease)     not on home o2  . Pulmonary nodule   . Anemia   . Arthritis   . CHF (congestive heart failure) 2007    Past Surgical History  Procedure Laterality Date  . Cardiac catheterization    . Gallbladder surgery    . External ear surgery      as a child  . Coronary stent placement       Successful stenting of the right renal ostium   complicated by distal wire perforation.  The wire perforation was  treated with embolization of the branch vessels involved.  Will check CT   scan to assess the right renal parenchymal hematoma.  She will be   maintained on both aspirin and Plavix.  Will place a Foley catheter to   avoid clotting since hematuria is likely.      Family History  Problem Relation Age of Onset  . Coronary artery disease    . Heart disease Mother 34    Heart Disease before age 3  . Hypertension Mother   . Hyperlipidemia Mother   . Peripheral vascular disease Mother   . Cancer Father   . Hyperlipidemia Father   . Hypertension Father    Social History:  reports that she quit smoking about 9 years ago. Her smoking use included Cigarettes. She has a 31 pack-year  smoking history. She does not have any smokeless tobacco history on file. She reports that she does not drink alcohol or use illicit drugs.  Allergies:  Allergies  Allergen Reactions  . Lisinopril     REACTION: D/C DUE TO COUGH/MC  . Oxycodone     Vomiting   Medication reviewed  Results for orders placed or performed during the hospital encounter of 10/31/14 (from the past 48 hour(s))  Basic metabolic panel  (if pt has PMH of COPD)     Status: Abnormal   Collection Time: 10/31/14 12:23 AM  Result Value Ref Range   Sodium 136 135 - 145 mmol/L   Potassium 4.0 3.5 - 5.1 mmol/L   Chloride 102 101 - 111 mmol/L   CO2 22 22 - 32 mmol/L   Glucose, Bld 121 (H) 65 - 99 mg/dL   BUN 10 6 - 20 mg/dL   Creatinine, Ser 1.05 (H) 0.44 - 1.00 mg/dL   Calcium 9.4 8.9 - 10.3 mg/dL   GFR calc non Af Amer 60 (L) >60 mL/min   GFR calc Af Amer >60 >60 mL/min    Comment: (NOTE) The eGFR has been calculated using the CKD EPI equation. This  calculation has not been validated in all clinical situations. eGFR's persistently <60 mL/min signify possible Chronic Kidney Disease.    Anion gap 12 5 - 15  CBC with Differential     Status: Abnormal   Collection Time: 10/31/14 12:23 AM  Result Value Ref Range   WBC 17.0 (H) 4.0 - 10.5 K/uL   RBC 4.90 3.87 - 5.11 MIL/uL   Hemoglobin 15.4 (H) 12.0 - 15.0 g/dL   HCT 44.9 36.0 - 46.0 %   MCV 91.6 78.0 - 100.0 fL   MCH 31.4 26.0 - 34.0 pg   MCHC 34.3 30.0 - 36.0 g/dL   RDW 13.1 11.5 - 15.5 %   Platelets 322 150 - 400 K/uL   Neutrophils Relative % 88 (H) 43 - 77 %   Neutro Abs 15.0 (H) 1.7 - 7.7 K/uL   Lymphocytes Relative 6 (L) 12 - 46 %   Lymphs Abs 1.1 0.7 - 4.0 K/uL   Monocytes Relative 5 3 - 12 %   Monocytes Absolute 0.8 0.1 - 1.0 K/uL   Eosinophils Relative 1 0 - 5 %   Eosinophils Absolute 0.1 0.0 - 0.7 K/uL   Basophils Relative 0 0 - 1 %   Basophils Absolute 0.1 0.0 - 0.1 K/uL  Brain natriuretic peptide     Status: None   Collection Time: 10/31/14  12:23 AM  Result Value Ref Range   B Natriuretic Peptide 79.6 0.0 - 100.0 pg/mL  I-stat troponin, ED  (if patient has PMH of COPD)  not at [facility]     Status: None   Collection Time: 10/31/14 12:28 AM  Result Value Ref Range   Troponin i, poc 0.00 0.00 - 0.08 ng/mL   Comment 3            Comment: Due to the release kinetics of cTnI, a negative result within the first hours of the onset of symptoms does not rule out myocardial infarction with certainty. If myocardial infarction is still suspected, repeat the test at appropriate intervals.   Rapid strep screen     Status: Abnormal   Collection Time: 10/31/14  1:36 AM  Result Value Ref Range   Streptococcus, Group A Screen (Direct) POSITIVE (A) NEGATIVE   Dg Chest 2 View (if Patient Has Fever And/or Copd)  10/31/2014   CLINICAL DATA:  Shortness of breath, sore throat, fever  EXAM: CHEST  2 VIEW  COMPARISON:  11/22/2012  FINDINGS: Hyperinflation, apical emphysema, and diffuse interstitial coarsening. No focal opacity to suggest pneumonia. No edema, effusion, or pneumothorax. Normal heart size when accounting for lower mediastinal fat. Negative aortic hilar contours.  IMPRESSION: COPD without acute superimposed disease.   Electronically Signed   By: Monte Fantasia M.D.   On: 10/31/2014 00:59    Review of Systems  Constitutional: Positive for fever and chills. Negative for weight loss, malaise/fatigue and diaphoresis.  HENT: Positive for sore throat. Negative for congestion, ear discharge, ear pain, hearing loss, nosebleeds and tinnitus.   Eyes: Negative.   Respiratory: Positive for shortness of breath and wheezing. Negative for cough, hemoptysis, sputum production and stridor.   Cardiovascular: Positive for chest pain. Negative for palpitations, orthopnea, claudication, leg swelling and PND.  Gastrointestinal: Negative.   Genitourinary: Negative.   Musculoskeletal: Negative.   Skin: Negative for itching and rash.  Neurological:  Negative.  Negative for weakness and headaches.  Endo/Heme/Allergies: Negative.   Psychiatric/Behavioral: Negative.     Blood pressure 105/63, pulse 85, temperature 100.7 F (38.2 C),  temperature source Oral, resp. rate 16, SpO2 93 %. Physical Exam  Constitutional: She is oriented to person, place, and time. She appears well-developed and well-nourished.  HENT:  Head: Normocephalic and atraumatic.  Mouth/Throat: No oropharyngeal exudate.  Eyes: Conjunctivae and EOM are normal. Pupils are equal, round, and reactive to light. No scleral icterus.  Neck: Normal range of motion. Neck supple. No JVD present. No tracheal deviation present. No thyromegaly present.  Cardiovascular: Normal rate and regular rhythm.  Exam reveals no gallop and no friction rub.   No murmur heard. Respiratory: Effort normal. No respiratory distress. She has wheezes. She has no rales. She exhibits no tenderness.  GI: Soft. Bowel sounds are normal. She exhibits no distension. There is no tenderness. There is no rebound and no guarding.  Musculoskeletal: Normal range of motion. She exhibits no edema or tenderness.  Lymphadenopathy:    She has no cervical adenopathy.  Neurological: She is alert and oriented to person, place, and time. She has normal reflexes. She displays normal reflexes. No cranial nerve deficit. She exhibits normal muscle tone. Coordination normal.  Skin: Skin is warm and dry. No rash noted. No erythema. No pallor.  Psychiatric: She has a normal mood and affect. Her behavior is normal. Judgment and thought content normal.     Assessment/Plan Fever secondary to strep throat Strep throat levaquin 548m iv qday  Dyspnea secondary to Copd exacerbation Solumedrol 860miv q8h levaquin 50089mv qday spiriva 1puff  qday Albuterol 1 neb po q6h and q6h prn  Polycythemia Check cbc in am  Hyperglycemia Check hga1c  Cp Tele Trop i q6h x3 Check echo Check d dimer If d dimer is positive then VQ scan  or CTA chest depending upon renal function Consider cardiology consultation in am  CAD Cont current medications  DVT prophylaxis:  Scd.  Lovenox  KIMJani Gravel24/2016, 5:08 AM

## 2014-10-31 NOTE — Progress Notes (Signed)
  Echocardiogram 2D Echocardiogram has been performed.  Brenda [name] 10/31/2014, 10:18 AM

## 2014-10-31 NOTE — ED Notes (Signed)
Pt was walked approximately 25 feet without O2 and PO2 dropped to 86%.

## 2014-11-01 DIAGNOSIS — J9601 Acute respiratory failure with hypoxia: Secondary | ICD-10-CM | POA: Diagnosis present

## 2014-11-01 DIAGNOSIS — R7309 Other abnormal glucose: Secondary | ICD-10-CM

## 2014-11-01 DIAGNOSIS — D45 Polycythemia vera: Secondary | ICD-10-CM

## 2014-11-01 DIAGNOSIS — J441 Chronic obstructive pulmonary disease with (acute) exacerbation: Secondary | ICD-10-CM

## 2014-11-01 DIAGNOSIS — D751 Secondary polycythemia: Secondary | ICD-10-CM

## 2014-11-01 DIAGNOSIS — J020 Streptococcal pharyngitis: Secondary | ICD-10-CM

## 2014-11-01 DIAGNOSIS — R739 Hyperglycemia, unspecified: Secondary | ICD-10-CM

## 2014-11-01 LAB — HEMOGLOBIN A1C
Hgb A1c MFr Bld: 5.9 % — ABNORMAL HIGH (ref 4.8–5.6)
MEAN PLASMA GLUCOSE: 123 mg/dL

## 2014-11-01 LAB — CBC
HEMATOCRIT: 44.8 % (ref 36.0–46.0)
Hemoglobin: 14.6 g/dL (ref 12.0–15.0)
MCH: 30.5 pg (ref 26.0–34.0)
MCHC: 32.6 g/dL (ref 30.0–36.0)
MCV: 93.7 fL (ref 78.0–100.0)
Platelets: 314 10*3/uL (ref 150–400)
RBC: 4.78 MIL/uL (ref 3.87–5.11)
RDW: 13.6 % (ref 11.5–15.5)
WBC: 27 10*3/uL — ABNORMAL HIGH (ref 4.0–10.5)

## 2014-11-01 LAB — COMPREHENSIVE METABOLIC PANEL
ALBUMIN: 3.3 g/dL — AB (ref 3.5–5.0)
ALT: 15 U/L (ref 14–54)
AST: 14 U/L — ABNORMAL LOW (ref 15–41)
Alkaline Phosphatase: 70 U/L (ref 38–126)
Anion gap: 9 (ref 5–15)
BILIRUBIN TOTAL: 0.5 mg/dL (ref 0.3–1.2)
BUN: 22 mg/dL — AB (ref 6–20)
CO2: 22 mmol/L (ref 22–32)
CREATININE: 1.13 mg/dL — AB (ref 0.44–1.00)
Calcium: 9.2 mg/dL (ref 8.9–10.3)
Chloride: 106 mmol/L (ref 101–111)
GFR calc Af Amer: >60 mL/min (ref >60)
GFR calc non Af Amer: 54 mL/min — ABNORMAL LOW (ref >60)
GLUCOSE: 173 mg/dL — AB (ref 65–99)
POTASSIUM: 4.1 mmol/L (ref 3.5–5.1)
SODIUM: 137 mmol/L (ref 135–145)
Total Protein: 7.4 g/dL (ref 6.5–8.1)

## 2014-11-01 NOTE — Progress Notes (Addendum)
Pt ambulating in the room to the the bathroom. Removed oxygen for trial. SPo2 off the O2 91% will recheck pt advised to call if she becomes SOB. No c/o pain, sitting up in the chair.

## 2014-11-01 NOTE — Progress Notes (Signed)
[facility] PROGRESS NOTE   Brenda [name] AOZ:[id number] DOB: 12-Oct-1960 DOA: 10/31/2014 PCP: No primary care provider on file.  HPI/Subjective: Wanted to get home, denies any fever or chills. Still has some shortness of breath.  Assessment/Plan: Active Problems:   COPD exacerbation   Hyperglycemia   Polycythemia   Strep throat    Acute COPD exacerbation Patient presented with wheezing, shortness of breath and sputum production. Placed on levofloxacin and Solu-Medrol. Supportive management with broncho-dilators, mucolytic, antitussives and oxygen.  Streptococcal pharyngitis Has sore throat which tested positive for GBS. Patient is on levofloxacin, this is well covered.  Acute respiratory failure Presents with hypoxia and her oxygen saturation dropped to 87% on room air on admission. Patient required oxygen supplementation to keep O2 sats above 90%. Today oxygen saturation is 88% with ambulation, continue current treatments.  Dehydration Prerenal azotemia with BUN of 22 and a creatinine of 1.1, encourage oral intake and follow clinically.  Code Status: Full Code Family Communication: Plan discussed with the patient. Disposition Plan: Remains inpatient Diet: Diet Heart Room service appropriate?: Yes; Fluid consistency:: Thin  Consultants:  none  Procedures:  none  Antibiotics:  levofloxacin   Objective: Filed Vitals:   11/01/14 1031  BP: 117/60  Pulse: 81  Temp: 97.6 F (36.4 C)  Resp: 18    Intake/Output Summary (Last 24 hours) at 11/01/14 1332 Last data filed at 11/01/14 0600  Gross per 24 hour  Intake   1660 ml  Output    725 ml  Net    935 ml   Filed Weights   10/31/14 0700 11/01/14 0521  Weight: 96.4 kg (212 lb 8.4 oz) 96.6 kg (212 lb 15.4 oz)    Exam: General: Alert and awake, oriented x3, not in any acute distress. HEENT: anicteric sclera, pupils reactive to light and accommodation, EOMI CVS: S1-S2 clear, no murmur rubs or  gallops Chest: clear to auscultation bilaterally, no wheezing, rales or rhonchi Abdomen: soft nontender, nondistended, normal bowel sounds, no organomegaly Extremities: no cyanosis, clubbing or edema noted bilaterally Neuro: Cranial nerves II-XII intact, no focal neurological deficits  Data Reviewed: Basic Metabolic Panel:  Recent Labs Lab 10/31/14 0023 11/01/14 0452  NA 136 137  K 4.0 4.1  CL 102 106  CO2 22 22  GLUCOSE 121* 173*  BUN 10 22*  CREATININE 1.05* 1.13*  CALCIUM 9.4 9.2   Liver Function Tests:  Recent Labs Lab 11/01/14 0452  AST 14*  ALT 15  ALKPHOS 70  BILITOT 0.5  PROT 7.4  ALBUMIN 3.3*   No results for input(s): LIPASE, AMYLASE in the last 168 hours. No results for input(s): AMMONIA in the last 168 hours. CBC:  Recent Labs Lab 10/31/14 0023 11/01/14 0452  WBC 17.0* 27.0*  NEUTROABS 15.0*  --   HGB 15.4* 14.6  HCT 44.9 44.8  MCV 91.6 93.7  PLT 322 314   Cardiac Enzymes:  Recent Labs Lab 10/31/14 0813 10/31/14 1130 10/31/14 1933  TROPONINI 0.03 <0.03 <0.03   BNP (last 3 results)  Recent Labs  10/31/14 0023  BNP 79.6    ProBNP (last 3 results) No results for input(s): PROBNP in the last 8760 hours.  CBG: No results for input(s): GLUCAP in the last 168 hours.  Micro Recent Results (from the past 240 hour(s))  Rapid strep screen     Status: Abnormal   Collection Time: 10/31/14  1:36 AM  Result Value Ref Range Status   Streptococcus, Group A Screen (Direct) POSITIVE (A) NEGATIVE Final  Culture, blood (routine x 2)     Status: None (Preliminary result)   Collection Time: 10/31/14  8:34 AM  Result Value Ref Range Status   Specimen Description BLOOD LEFT ARM  Final   Special Requests BOTTLES DRAWN AEROBIC ONLY 10CC  Final   Culture   Final           BLOOD CULTURE RECEIVED NO GROWTH TO DATE CULTURE WILL BE HELD FOR 5 DAYS BEFORE ISSUING A FINAL NEGATIVE REPORT Performed at [facility]    Report Status PENDING   Incomplete  Culture, blood (routine x 2)     Status: None (Preliminary result)   Collection Time: 10/31/14  8:37 AM  Result Value Ref Range Status   Specimen Description BLOOD ARM LEFT  Final   Special Requests BOTTLES DRAWN AEROBIC ONLY 10CC  Final   Culture   Final           BLOOD CULTURE RECEIVED NO GROWTH TO DATE CULTURE WILL BE HELD FOR 5 DAYS BEFORE ISSUING A FINAL NEGATIVE REPORT Performed at [facility]    Report Status PENDING  Incomplete  Culture, Urine     Status: None (Preliminary result)   Collection Time: 10/31/14  8:44 AM  Result Value Ref Range Status   Specimen Description URINE, RANDOM  Final   Special Requests NONE  Final   Colony Count   Final    >=100,000 COLONIES/ML Performed at [facility]    Culture   Final    [place] Performed at [facility]    Report Status PENDING  Incomplete     Studies: Dg Chest 2 View (if Patient Has Fever And/or Copd)  10/31/2014   CLINICAL DATA:  Shortness of breath, sore throat, fever  EXAM: CHEST  2 VIEW  COMPARISON:  11/22/2012  FINDINGS: Hyperinflation, apical emphysema, and diffuse interstitial coarsening. No focal opacity to suggest pneumonia. No edema, effusion, or pneumothorax. Normal heart size when accounting for lower mediastinal fat. Negative aortic hilar contours.  IMPRESSION: COPD without acute superimposed disease.   Electronically Signed   By: Monte Fantasia M.D.   On: 10/31/2014 00:59    Scheduled Meds: . carvedilol  6.25 mg Oral BID WC  . enoxaparin (LOVENOX) injection  40 mg Subcutaneous Q24H  . furosemide  20 mg Oral QODAY  . levofloxacin (LEVAQUIN) IV  500 mg Intravenous Q24H  . losartan  50 mg Oral Daily  . methylPREDNISolone (SOLU-MEDROL) injection  80 mg Intravenous 3 times per day  . potassium chloride SA  20 mEq Oral QODAY  . simvastatin  40 mg Oral Daily  . sodium chloride  3 mL Intravenous Q12H  . sodium chloride  3 mL Intravenous Q12H  . ticagrelor  60 mg  Oral BID  . tiotropium  18 mcg Inhalation Daily   Continuous Infusions:      Time spent: 35 minutes    [facility] A  [facility] Pager [phone] If 7PM-7AM, please contact night-coverage at [email or web address], password [facility] 11/01/2014, 1:32 PM  LOS: 1 day

## 2014-11-01 NOTE — Clinical Documentation Improvement (Signed)
Please specify diagnosis related to below supporting information, if appropriate.  Possible Clinical Conditions? Chronic Systolic Congestive Heart Failure Chronic Diastolic Congestive Heart Failure Chronic Systolic & Diastolic Congestive Heart Failure Acute Systolic Congestive Heart Failure Acute Diastolic Congestive Heart Failure Acute Systolic & Diastolic Congestive Heart Failure Acute on Chronic Systolic Congestive Heart Failure Acute on Chronic Diastolic Congestive Heart Failure Acute on Chronic Systolic & Diastolic Congestive Heart Failure Other Condition________________________________________ Cannot Clinically Determine  Supporting Information:  Per 10/31/14 H&P & MD progress notes = Patient with history of CHF.  Pt. Thought that she might have CHF and therefore presented to ED for evaluation.   Echo completed on 10/30/13.   Thank You, Serena Colonel ,RN Clinical Documentation Specialist:  [place] [employer]

## 2014-11-01 NOTE — Progress Notes (Signed)
SATURATION QUALIFICATIONS: (This note is used to comply with regulatory documentation for home oxygen)   Patient Saturations on Room Air at Rest = 94%  Patient Saturations on Room Air while Ambulating = 86% - 87% then back up to 91% when talking and walking

## 2014-11-01 NOTE — Progress Notes (Signed)
SATURATION QUALIFICATIONS: (This note is used to comply with regulatory documentation for home oxygen)  Patient Saturations on Room Air at Rest = 94%  Patient Saturations on Room Air while Ambulating = 88%  Patient Saturations on 2 Liters of oxygen while Ambulating = 95%  Please briefly explain why patient needs home oxygen: 

## 2014-11-02 DIAGNOSIS — I5032 Chronic diastolic (congestive) heart failure: Secondary | ICD-10-CM | POA: Diagnosis present

## 2014-11-02 LAB — URINE CULTURE: Colony Count: >=100000

## 2014-11-02 LAB — CBC
HCT: 45.2 % (ref 36.0–46.0)
Hemoglobin: 14.8 g/dL (ref 12.0–15.0)
MCH: 30.7 pg (ref 26.0–34.0)
MCHC: 32.7 g/dL (ref 30.0–36.0)
MCV: 93.8 fL (ref 78.0–100.0)
Platelets: 362 10*3/uL (ref 150–400)
RBC: 4.82 MIL/uL (ref 3.87–5.11)
RDW: 13.6 % (ref 11.5–15.5)
WBC: 25.9 10*3/uL — ABNORMAL HIGH (ref 4.0–10.5)

## 2014-11-02 LAB — BASIC METABOLIC PANEL
ANION GAP: 8 (ref 5–15)
BUN: 23 mg/dL — ABNORMAL HIGH (ref 6–20)
CALCIUM: 9.4 mg/dL (ref 8.9–10.3)
CHLORIDE: 107 mmol/L (ref 101–111)
CO2: 22 mmol/L (ref 22–32)
Creatinine, Ser: 1 mg/dL (ref 0.44–1.00)
GFR calc Af Amer: >60 mL/min (ref >60)
GFR calc non Af Amer: >60 mL/min (ref >60)
GLUCOSE: 183 mg/dL — AB (ref 65–99)
Potassium: 4.6 mmol/L (ref 3.5–5.1)
SODIUM: 137 mmol/L (ref 135–145)

## 2014-11-02 MED ORDER — PREDNISONE 10 MG (21) PO TBPK: ORAL_TABLET | ORAL | Status: DC

## 2014-11-02 MED ORDER — BUDESONIDE-FORMOTEROL FUMARATE 160-4.5 MCG/ACT IN AERO: 2.0000 | INHALATION_SPRAY | Freq: Two times a day (BID) | RESPIRATORY_TRACT | Status: DC

## 2014-11-02 MED ORDER — LEVOFLOXACIN 750 MG PO TABS: 750.0000 mg | ORAL_TABLET | Freq: Every day | ORAL | Status: DC

## 2014-11-02 MED ORDER — GUAIFENESIN ER 600 MG PO TB12: 1200.0000 mg | ORAL_TABLET | Freq: Two times a day (BID) | ORAL | Status: DC

## 2014-11-02 NOTE — Discharge Summary (Signed)
Physician Discharge Summary  DESAREA OHAGAN FMB:[id number] DOB: Feb 28, 1961 DOA: 10/31/2014  PCP: No primary care provider on file.  Admit date: 10/31/2014 Discharge date: 11/02/2014  Time spent: 40 minutes  Recommendations for Outpatient Follow-up:  1. Follow-up with primary care physician within one week. 2. Levofloxacin, prednisone taper and Mucinex for acute COPD exacerbation, Symbicort added.  Discharge Diagnoses:  Principal Problem:   Acute respiratory failure with hypoxia Active Problems:   COPD exacerbation   Hyperglycemia   Polycythemia   Strep throat   Chronic diastolic CHF (congestive heart failure)   Discharge Condition: Stable  Diet recommendation: Heart healthy  Filed Weights   10/31/14 0700 11/01/14 0521 11/02/14 0503  Weight: 96.4 kg (212 lb 8.4 oz) 96.6 kg (212 lb 15.4 oz) 97.3 kg (214 lb 8.1 oz)    History of present illness:  54 yo female with Copd apparently had the chills at about 5pm, and at 11 pm c/o fever. And therefore presented to ED for evaluation for dyspnea, and chest tightness. Pt thought that she might have chf, and therefore presented to ED for evaluation. Pt was noted on CXR to have Copd, and normal bnp. Pt + for strep throat, Pox slightly low at 87% and therefore ED requested that we admit the patient for Copd exacerbation, and strep throat.    Hospital Course:   Acute COPD exacerbation Patient presented with wheezing, shortness of breath and sputum production. Placed on levofloxacin and Solu-Medrol. Supportive management with broncho-dilators, mucolytic, antitussives and oxygen. Felt better with less wheezing, discharged on levofloxacin, prednisone taper and Mucinex. Patient is on a Spiriva, added Symbicort on discharge  Streptococcal pharyngitis Has sore throat which tested positive for GBS. Patient is on levofloxacin, this is well covered.  Acute respiratory failure Presents with hypoxia and her oxygen saturation dropped to 87%  on room air on admission. Patient required oxygen supplementation to keep O2 sats above 90%. Today oxygen saturation is 88% with ambulation, continue current treatments. Patient is still hypoxic with ambulation, oxygen saturation went down to 81%, will discharge home on oxygen.  UTI, Klebsiella pneumoniae Patient has Klebsiella pneumoniae UTI as well. Has her urine was cloudy and there is many bacteria. The culture showed Klebsiella pneumoniae, susceptible to levofloxacin. Patient will be on levofloxacin for 5 more days.  Dehydration Prerenal azotemia with BUN of 22 and a creatinine of 1.1, encouraged oral intake, creatinine went down to 1.0 today..  Procedures:  None  Consultations:  None  Discharge Exam: Filed Vitals:   11/02/14 0949  BP: 137/47  Pulse: 70  Temp:   Resp:    General: Alert and awake, oriented x3, not in any acute distress. HEENT: anicteric sclera, pupils reactive to light and accommodation, EOMI CVS: S1-S2 clear, no murmur rubs or gallops Chest: clear to auscultation bilaterally, no wheezing, rales or rhonchi Abdomen: soft nontender, nondistended, normal bowel sounds, no organomegaly Extremities: no cyanosis, clubbing or edema noted bilaterally Neuro: Cranial nerves II-XII intact, no focal neurological deficits  Discharge Instructions   Discharge Instructions    Diet - low sodium heart healthy    Complete by:  As directed      Increase activity slowly    Complete by:  As directed           Current Discharge Medication List    START taking these medications   Details  budesonide-formoterol (SYMBICORT) 160-4.5 MCG/ACT inhaler Inhale 2 puffs into the lungs 2 (two) times daily. Qty: 1 Inhaler, Refills: 12    guaiFENesin (  MUCINEX) 600 MG 12 hr tablet Take 2 tablets (1,200 mg total) by mouth 2 (two) times daily. Qty: 30 tablet, Refills: 0    levofloxacin (LEVAQUIN) 750 MG tablet Take 1 tablet (750 mg total) by mouth daily. Qty: 5 tablet, Refills:  0    predniSONE (STERAPRED UNI-PAK 21 TAB) 10 MG (21) TBPK tablet Take 6-5-4-3-2-1 tablets PO daily till gone Qty: 21 tablet, Refills: 0      CONTINUE these medications which have NOT CHANGED   Details  acetaminophen (TYLENOL) 500 MG tablet Take 500 mg by mouth every 6 (six) hours as needed for mild pain.    carvedilol (COREG) 6.25 MG tablet TAKE 1 TABLET BY MOUTH TWICE DAILY WITH MEALS Qty: 60 tablet, Refills: 3    furosemide (LASIX) 20 MG tablet TAKE ONE TABLET BY MOUTH EVERY OTHER DAY Qty: 30 tablet, Refills: 5    KLOR-CON M20 20 MEQ tablet TAKE ONE TABLET BY MOUTH EVERY OTHER DAY Qty: 15 tablet, Refills: 5    losartan (COZAAR) 50 MG tablet Take 1 tablet (50 mg total) by mouth daily. Qty: 90 tablet, Refills: 3    simvastatin (ZOCOR) 40 MG tablet TAKE ONE TABLET BY MOUTH ONCE DAILY Qty: 30 tablet, Refills: 5    ticagrelor (BRILINTA) 60 MG TABS tablet Take 1 tablet (60 mg total) by mouth 2 (two) times daily. Qty: 60 tablet, Refills: 5    tiotropium (SPIRIVA HANDIHALER) 18 MCG inhalation capsule Place 1 capsule (18 mcg total) into inhaler and inhale daily. Qty: 30 capsule, Refills: 6   Associated Diagnoses: Chronic obstructive pulmonary disease, unspecified COPD, unspecified chronic bronchitis type       Allergies  Allergen Reactions  . Lisinopril     REACTION: D/C DUE TO COUGH/MC  . Oxycodone     Vomiting       The results of significant diagnostics from this hospitalization (including imaging, microbiology, ancillary and laboratory) are listed below for reference.    Significant Diagnostic Studies: Dg Chest 2 View (if Patient Has Fever And/or Copd)  10/31/2014   CLINICAL DATA:  Shortness of breath, sore throat, fever  EXAM: CHEST  2 VIEW  COMPARISON:  11/22/2012  FINDINGS: Hyperinflation, apical emphysema, and diffuse interstitial coarsening. No focal opacity to suggest pneumonia. No edema, effusion, or pneumothorax. Normal heart size when accounting for lower  mediastinal fat. Negative aortic hilar contours.  IMPRESSION: COPD without acute superimposed disease.   Electronically Signed   By: Monte Fantasia M.D.   On: 10/31/2014 00:59    Microbiology: Recent Results (from the past 240 hour(s))  Rapid strep screen     Status: Abnormal   Collection Time: 10/31/14  1:36 AM  Result Value Ref Range Status   Streptococcus, Group A Screen (Direct) POSITIVE (A) NEGATIVE Final  Culture, blood (routine x 2)     Status: None (Preliminary result)   Collection Time: 10/31/14  8:34 AM  Result Value Ref Range Status   Specimen Description BLOOD LEFT ARM  Final   Special Requests BOTTLES DRAWN AEROBIC ONLY 10CC  Final   Culture   Final           BLOOD CULTURE RECEIVED NO GROWTH TO DATE CULTURE WILL BE HELD FOR 5 DAYS BEFORE ISSUING A FINAL NEGATIVE REPORT Performed at [facility]    Report Status PENDING  Incomplete  Culture, blood (routine x 2)     Status: None (Preliminary result)   Collection Time: 10/31/14  8:37 AM  Result Value Ref Range Status  Specimen Description BLOOD ARM LEFT  Final   Special Requests BOTTLES DRAWN AEROBIC ONLY 10CC  Final   Culture   Final           BLOOD CULTURE RECEIVED NO GROWTH TO DATE CULTURE WILL BE HELD FOR 5 DAYS BEFORE ISSUING A FINAL NEGATIVE REPORT Performed at [facility]    Report Status PENDING  Incomplete  Culture, Urine     Status: None   Collection Time: 10/31/14  8:44 AM  Result Value Ref Range Status   Specimen Description URINE, RANDOM  Final   Special Requests NONE  Final   Colony Count   Final    >=100,000 COLONIES/ML Performed at [facility]    Culture   Final    KLEBSIELLA PNEUMONIAE Performed at [facility]    Report Status 11/02/2014 FINAL  Final   Organism ID, Bacteria KLEBSIELLA PNEUMONIAE  Final      Susceptibility   Klebsiella pneumoniae - MIC*    AMPICILLIN >=32 RESISTANT Resistant     CEFAZOLIN <=4 SENSITIVE Sensitive     CEFTRIAXONE <=1  SENSITIVE Sensitive     CIPROFLOXACIN <=0.25 SENSITIVE Sensitive     GENTAMICIN <=1 SENSITIVE Sensitive     LEVOFLOXACIN <=0.12 SENSITIVE Sensitive     NITROFURANTOIN 64 INTERMEDIATE Intermediate     TOBRAMYCIN <=1 SENSITIVE Sensitive     TRIMETH/SULFA <=20 SENSITIVE Sensitive     PIP/TAZO <=4 SENSITIVE Sensitive     * KLEBSIELLA PNEUMONIAE     Labs: Basic Metabolic Panel:  Recent Labs Lab 10/31/14 0023 11/01/14 0452 11/02/14 0756  NA 136 137 137  K 4.0 4.1 4.6  CL 102 106 107  CO2 22 22 22   GLUCOSE 121* 173* 183*  BUN 10 22* 23*  CREATININE 1.05* 1.13* 1.00  CALCIUM 9.4 9.2 9.4   Liver Function Tests:  Recent Labs Lab 11/01/14 0452  AST 14*  ALT 15  ALKPHOS 70  BILITOT 0.5  PROT 7.4  ALBUMIN 3.3*   No results for input(s): LIPASE, AMYLASE in the last 168 hours. No results for input(s): AMMONIA in the last 168 hours. CBC:  Recent Labs Lab 10/31/14 0023 11/01/14 0452 11/02/14 0756  WBC 17.0* 27.0* 25.9*  NEUTROABS 15.0*  --   --   HGB 15.4* 14.6 14.8  HCT 44.9 44.8 45.2  MCV 91.6 93.7 93.8  PLT 322 314 362   Cardiac Enzymes:  Recent Labs Lab 10/31/14 0813 10/31/14 1130 10/31/14 1933  TROPONINI 0.03 <0.03 <0.03   BNP: BNP (last 3 results)  Recent Labs  10/31/14 0023  BNP 79.6    ProBNP (last 3 results) No results for input(s): PROBNP in the last 8760 hours.  CBG: No results for input(s): GLUCAP in the last 168 hours.     Signed:  Adiah Guereca A  [facility] 11/02/2014, 11:15 AM

## 2014-11-02 NOTE — Progress Notes (Signed)
SATURATION QUALIFICATIONS: (This note is used to comply with regulatory documentation for home oxygen)  Patient Saturations on Room Air at Rest = 90%  Patient Saturations on Room Air while Ambulating = 81%  Patient Saturations on 2 Liters of oxygen while Ambulating = 94%  Please briefly explain why patient needs home oxygen: pt needs oxygen to ambulate comfortably.

## 2014-11-02 NOTE — Progress Notes (Signed)
Patient needs home oxygen, [facility] called to deliver oxygen to the room today prior to discharging home today; Aneta Mins [phone]

## 2014-11-03 ENCOUNTER — Encounter: Payer: Self-pay | Admitting: Family

## 2014-11-06 LAB — CULTURE, BLOOD (ROUTINE X 2)
CULTURE: NO GROWTH
Culture: NO GROWTH

## 2014-11-07 ENCOUNTER — Ambulatory Visit: Payer: Medicare Other | Admitting: Family

## 2014-11-07 ENCOUNTER — Encounter (HOSPITAL_COMMUNITY): Payer: Medicare Other

## 2014-11-07 ENCOUNTER — Other Ambulatory Visit (HOSPITAL_COMMUNITY): Payer: Medicare Other

## 2014-11-10 ENCOUNTER — Encounter: Payer: Self-pay | Admitting: Family

## 2014-11-13 ENCOUNTER — Ambulatory Visit: Payer: Medicare Other | Admitting: Family

## 2014-11-13 ENCOUNTER — Encounter (HOSPITAL_COMMUNITY): Payer: Medicare Other

## 2014-11-14 ENCOUNTER — Ambulatory Visit (INDEPENDENT_AMBULATORY_CARE_PROVIDER_SITE_OTHER): Payer: Medicare Other | Admitting: Family

## 2014-11-14 ENCOUNTER — Ambulatory Visit (HOSPITAL_COMMUNITY)
Admission: RE | Admit: 2014-11-14 | Discharge: 2014-11-14 | Disposition: A | Discharge status: Home / Self Care | Payer: Medicare Other | Source: Ambulatory Visit | Attending: Family | Admitting: Family

## 2014-11-14 ENCOUNTER — Encounter: Payer: Self-pay | Admitting: Family

## 2014-11-14 VITALS — BP 98/70 | HR 87 | Resp 16 | Ht 65.0 in | Wt 209.0 lb

## 2014-11-14 DIAGNOSIS — Z9889 Other specified postprocedural states: Secondary | ICD-10-CM | POA: Diagnosis not present

## 2014-11-14 DIAGNOSIS — Z87891 Personal history of nicotine dependence: Secondary | ICD-10-CM

## 2014-11-14 DIAGNOSIS — Z48812 Encounter for surgical aftercare following surgery on the circulatory system: Secondary | ICD-10-CM | POA: Insufficient documentation

## 2014-11-14 DIAGNOSIS — I771 Stricture of artery: Secondary | ICD-10-CM

## 2014-11-14 DIAGNOSIS — I701 Atherosclerosis of renal artery: Secondary | ICD-10-CM | POA: Diagnosis not present

## 2014-11-14 DIAGNOSIS — I708 Atherosclerosis of other arteries: Secondary | ICD-10-CM | POA: Diagnosis not present

## 2014-11-14 DIAGNOSIS — I6523 Occlusion and stenosis of bilateral carotid arteries: Secondary | ICD-10-CM | POA: Diagnosis not present

## 2014-11-14 NOTE — Progress Notes (Signed)
Established Carotid Patient   History of Present Illness  Brenda [name] is a 54 y.o. female patient of Dr. Kellie Simmering who is s/p bilateral carotid endarterectomies by Dr. Drucie Opitz most recently in 2009. She also had a right carotid to subclavian bypass and re\re anastomosis of right vertebral artery. She denies arm claudication symptoms, denies non healing wounds in extremities.   If she walks slowly she has no claudication in her calves, but if she walks fast she gets bilateral cramping in calves in about 3 minutes. Pt states she has stents in both renal and iliac arteries, placed by Dr. Burt Knack per pt. Review of records: Dr. Burt Knack has been ordering and reviewing results of renal artery Duplex and ABI's.   The patient reports history of TIA symptoms before the 2009 CEA as manifested by slurred speech and dizziness, lasted a few seconds with no remaining neurological deficits.The patient denies a history of amaurosis fugax or monocular blindness, denies a history unilateral  of facial drooping, denies a history of hemiplegia.  The patient denies New Medical or Surgical History.  Pt Diabetic: no Pt smoker: former smoker, quit in 2007  Pt meds include: Statin : yes ASA: no Other anticoagulants/antiplatelets: Brilinta prescribed by Dr. Burt Knack since pt had renal artery stents placed, per pt   Past Medical History  Diagnosis Date  . HTN (hypertension)   . CAD (coronary artery disease)   . Carotid artery occlusion   . Renal artery stenosis   . PVD (peripheral vascular disease)   . Hypercholesteremia   . Cardiomyopathy   . COPD (chronic obstructive pulmonary disease)     not on home o2  . Pulmonary nodule   . Anemia   . Arthritis   . CHF (congestive heart failure) 2007    Social History History  Substance Use Topics  . Smoking status: Former Smoker -- 1.00 packs/day for 31 years    Types: Cigarettes    Quit date: 06/09/2005  . Smokeless tobacco: Never Used  . Alcohol Use: No     Family History Family History  Problem Relation Age of Onset  . Coronary artery disease    . Heart disease Mother 26    Heart Disease before age 17  . Hypertension Mother   . Hyperlipidemia Mother   . Peripheral vascular disease Mother   . Cancer Father   . Hyperlipidemia Father   . Hypertension Father     Surgical History Past Surgical History  Procedure Laterality Date  . Cardiac catheterization    . Gallbladder surgery    . External ear surgery      as a child  . Coronary stent placement       Successful stenting of the right renal ostium   complicated by distal wire perforation.  The wire perforation was  treated with embolization of the branch vessels involved.  Will check CT   scan to assess the right renal parenchymal hematoma.  She will be   maintained on both aspirin and Plavix.  Will place a Foley catheter to   avoid clotting since hematuria is likely.      Allergies  Allergen Reactions  . Lisinopril     REACTION: D/C DUE TO COUGH/MC  . Oxycodone     Vomiting     Current Outpatient Prescriptions  Medication Sig Dispense Refill  . acetaminophen (TYLENOL) 500 MG tablet Take 500 mg by mouth every 6 (six) hours as needed for mild pain.    . budesonide-formoterol (SYMBICORT)  160-4.5 MCG/ACT inhaler Inhale 2 puffs into the lungs 2 (two) times daily. 1 Inhaler 12  . carvedilol (COREG) 6.25 MG tablet TAKE 1 TABLET BY MOUTH TWICE DAILY WITH MEALS 60 tablet 3  . furosemide (LASIX) 20 MG tablet TAKE ONE TABLET BY MOUTH EVERY OTHER DAY 30 tablet 5  . guaiFENesin (MUCINEX) 600 MG 12 hr tablet Take 2 tablets (1,200 mg total) by mouth 2 (two) times daily. 30 tablet 0  . KLOR-CON M20 20 MEQ tablet TAKE ONE TABLET BY MOUTH EVERY OTHER DAY 15 tablet 5  . losartan (COZAAR) 50 MG tablet Take 1 tablet (50 mg total) by mouth daily. 90 tablet 3  . simvastatin (ZOCOR) 40 MG tablet TAKE ONE TABLET BY MOUTH ONCE DAILY 30 tablet 5  . ticagrelor (BRILINTA) 60 MG TABS tablet Take 1  tablet (60 mg total) by mouth 2 (two) times daily. 60 tablet 5  . tiotropium (SPIRIVA HANDIHALER) 18 MCG inhalation capsule Place 1 capsule (18 mcg total) into inhaler and inhale daily. 30 capsule 6  . levofloxacin (LEVAQUIN) 750 MG tablet Take 1 tablet (750 mg total) by mouth daily. (Patient not taking: Reported on 11/14/2014) 5 tablet 0  . predniSONE (STERAPRED UNI-PAK 21 TAB) 10 MG (21) TBPK tablet Take 6-5-4-3-2-1 tablets PO daily till gone (Patient not taking: Reported on 11/14/2014) 21 tablet 0   No current facility-administered medications for this visit.    Review of Systems : See HPI for pertinent positives and negatives.  Physical Examination  Filed Vitals:   11/14/14 1608 11/14/14 1617  BP: 110/76 98/70  Pulse: 87 87  Resp:  16  Height:  5\' 5"  (1.651 m)  Weight:  209 lb (94.802 kg)  SpO2:  92%   Body mass index is 34.78 kg/(m^2).  General: WDWN female in NAD GAIT: normal Eyes: PERRLA Pulmonary:  Non-labored, CTAB, Negative  Rales, Negative rhonchi, & Negative wheezing.  Cardiac: regular Rhythm,  no detected murmur.  VASCULAR EXAM Carotid Bruits Right Left   Positive Negative    Aorta is not palpable. Radial pulses: right is 2+ palpable; left radial and ulnar pulses are not palpable, left brachial pulse is faintly palpable.                                                                                                                             LE Pulses Right Left       FEMORAL  not palpable  not palpable        POPLITEAL  not palpable   not palpable       POSTERIOR TIBIAL  not palpable   not palpable        DORSALIS PEDIS      ANTERIOR TIBIAL not palpable  not palpable     Gastrointestinal: soft, nontender, BS WNL, no r/g, obese, no palpable masses.  Musculoskeletal: Negative muscle atrophy/wasting. M/S 5/5 throughout, Extremities without ischemic changes. Feet are warm and pink.  Neurologic: A&O X  3; Appropriate Affect;  Speech is normal CN 2-12  intact, Pain and light touch intact in extremities, Motor exam as listed above.   Non-Invasive Vascular Imaging CAROTID DUPLEX 11/14/2014   CEREBROVASCULAR DUPLEX EVALUATION    INDICATION: Carotid endarterectomy     PREVIOUS INTERVENTION(S): Bilateral carotid endarterectomies, right subclavian to distal common carotid artery bypass graft and right vertebral artery transposition on 05/10/08    DUPLEX EXAM:     RIGHT  LEFT  Peak Systolic Velocities (cm/s) End Diastolic Velocities (cm/s) Plaque LOCATION Peak Systolic Velocities (cm/s) End Diastolic Velocities (cm/s) Plaque  83(graft) 11  CCA PROXIMAL 114 28   83(graft) 13  CCA MID 85 14   63(graft) 10  CCA DISTAL 81 12   106 11  ECA 165 15   86 24  ICA PROXIMAL 76 27   101 27  ICA MID 106 34   89 21  ICA DISTAL 96 30     Not Calculated ICA / CCA Ratio (PSV) Not Calculated  Antegrade Vertebral Flow Bidirectional  810 Brachial Systolic Pressure (mmHg) 94  Multiphasic (subclavian artery) Brachial Artery Waveforms Monophasic (subclavian artery)    Plaque Morphology:  HM = Homogeneous, HT = Heterogeneous, CP = Calcific Plaque, SP = Smooth Plaque, IP = Irregular Plaque     ADDITIONAL FINDINGS: . No significant stenosis of the bilateral external or left common carotid arteries. . No flow noted in the right common carotid artery consistent with bypass graft. . Greater than 300cm/s velocities noted in the bilateral proximal subclavian arteries. . Mostly retrograde, bidirectional flow noted in the left vertebral artery, based on limited visualization. . Decreased visualization of the right subclavian artery origin and subclavian-common carotid artery bypass graft proximal anastomosis due to depth and location. . Greater than 1mm/Mg difference noted in the bilateral brachial pressures.    IMPRESSION: 1. Patent bilateral carotid endarterectomy sites with no bilateral internal carotid artery stenoses. 2. Patent right subclavian to distal common  carotid artery bypass graft noted. 3. Evidence of left subclavian artery occlusive disease noted, as described above. 4. Comparison to the previous exam is noted on the second page of this report.      Compared to the previous exam:  No significant change noted when compared to the previous exam on 11/01/13.       Assessment: Brenda [name] is a 54 y.o. female who is s/p bilateral carotid endarterectomies, right subclavian to distal common carotid artery bypass graft and right vertebral artery transposition on 05/10/08. She denies arm claudication symptoms, denies non healing wounds in extremities.   If she walks slowly she has no claudication in her calves, but if she walks fast she gets bilateral cramping in calves in about 3 minutes. Pt states she has stents in both renal and iliac arteries, placed by Dr. Burt Knack per pt. Review of records: Dr. Burt Knack has been ordering and reviewing results of renal artery Duplex and ABI's.   The patient reports history of TIA symptoms before the 2009 CEA as manifested by slurred speech and dizziness, lasted a few seconds with no remaining neurological deficits.  Today's carotid Duplex suggests patent bilateral carotid endarterectomy sites with no bilateral internal carotid artery stenoses, patent right subclavian to distal common carotid artery bypass graft, and evidence of left subclavian artery occlusive disease. No significant change noted when compared to the previous exam on 11/01/13.    Plan: Follow-up in 1 year with Carotid Duplex.   I discussed in depth with the patient the nature  of atherosclerosis, and emphasized the importance of maximal medical management including strict control of blood pressure, blood glucose, and lipid levels, obtaining regular exercise, and continued cessation of smoking.  The patient is aware that without maximal medical management the underlying atherosclerotic disease process will progress, limiting the benefit of any  interventions. The patient was given information about stroke prevention and what symptoms should prompt the patient to seek immediate medical care. Thank you for allowing [place] to participate in this patient's care.  Clemon Chambers, RN, MSN, FNP-C [facility] Office: [phone]  Clinic Physician: Kellie Simmering  11/14/2014 4:26 PM

## 2014-11-14 NOTE — Patient Instructions (Signed)
Stroke Prevention Some medical conditions and behaviors are associated with an increased chance of having a stroke. You may prevent a stroke by making healthy choices and managing medical conditions. HOW CAN I REDUCE MY RISK OF HAVING A STROKE?   Stay physically active. Get at least 30 minutes of activity on most or all days.  Do not smoke. It may also be helpful to avoid exposure to secondhand smoke.  Limit alcohol use. Moderate alcohol use is considered to be:  No more than 2 drinks per day for men.  No more than 1 drink per day for nonpregnant women.  Eat healthy foods. This involves:  Eating 5 or more servings of fruits and vegetables a day.  Making dietary changes that address high blood pressure (hypertension), high cholesterol, diabetes, or obesity.  Manage your cholesterol levels.  Making food choices that are high in fiber and low in saturated fat, trans fat, and cholesterol may control cholesterol levels.  Take any prescribed medicines to control cholesterol as directed by your health care provider.  Manage your diabetes.  Controlling your carbohydrate and sugar intake is recommended to manage diabetes.  Take any prescribed medicines to control diabetes as directed by your health care provider.  Control your hypertension.  Making food choices that are low in salt (sodium), saturated fat, trans fat, and cholesterol is recommended to manage hypertension.  Take any prescribed medicines to control hypertension as directed by your health care provider.  Maintain a healthy weight.  Reducing calorie intake and making food choices that are low in sodium, saturated fat, trans fat, and cholesterol are recommended to manage weight.  Stop drug abuse.  Avoid taking birth control pills.  Talk to your health care provider about the risks of taking birth control pills if you are over 35 years old, smoke, get migraines, or have ever had a blood clot.  Get evaluated for sleep  disorders (sleep apnea).  Talk to your health care provider about getting a sleep evaluation if you snore a lot or have excessive sleepiness.  Take medicines only as directed by your health care provider.  For some people, aspirin or blood thinners (anticoagulants) are helpful in reducing the risk of forming abnormal blood clots that can lead to stroke. If you have the irregular heart rhythm of atrial fibrillation, you should be on a blood thinner unless there is a good reason you cannot take them.  Understand all your medicine instructions.  Make sure that other conditions (such as anemia or atherosclerosis) are addressed. SEEK IMMEDIATE MEDICAL CARE IF:   You have sudden weakness or numbness of the face, arm, or leg, especially on one side of the body.  Your face or eyelid droops to one side.  You have sudden confusion.  You have trouble speaking (aphasia) or understanding.  You have sudden trouble seeing in one or both eyes.  You have sudden trouble walking.  You have dizziness.  You have a loss of balance or coordination.  You have a sudden, severe headache with no known cause.  You have new chest pain or an irregular heartbeat. Any of these symptoms may represent a serious problem that is an emergency. Do not wait to see if the symptoms will go away. Get medical help at once. Call your local emergency services (911 in [place] Do not drive yourself to the hospital. Document Released: 07/03/2004 Document Revised: 10/10/2013 Document Reviewed: 11/26/2012 [employer] Patient Information 2015 [employer]. This information is not intended to replace advice given   to you by your health care provider. Make sure you discuss any questions you have with your health care provider.  

## 2014-11-16 NOTE — Addendum Note (Signed)
Addended by: Dorthula Rue L on: 11/16/2014 08:56 AM   Modules accepted: Orders

## 2014-11-20 ENCOUNTER — Encounter: Payer: Self-pay | Admitting: Cardiovascular Disease

## 2014-11-20 ENCOUNTER — Ambulatory Visit (INDEPENDENT_AMBULATORY_CARE_PROVIDER_SITE_OTHER): Payer: Medicare Other | Admitting: Cardiovascular Disease

## 2014-11-20 VITALS — BP 120/70 | HR 84 | Ht 65.0 in | Wt 209.0 lb

## 2014-11-20 DIAGNOSIS — E785 Hyperlipidemia, unspecified: Secondary | ICD-10-CM

## 2014-11-20 DIAGNOSIS — I10 Essential (primary) hypertension: Secondary | ICD-10-CM

## 2014-11-20 DIAGNOSIS — I701 Atherosclerosis of renal artery: Secondary | ICD-10-CM

## 2014-11-20 NOTE — Patient Instructions (Addendum)
Medication Instructions:  Your physician recommends that you continue on your current medications as directed. Please refer to the Current Medication list given to you today.  Labwork: Your physician recommends that you return for a FASTING LIPID and LIVER profile in 1 YEAR--nothing to eat or drink after midnight.   Testing/Procedures: Your physician has requested that you have a renal artery duplex in 1 YEAR. During this test, an ultrasound is used to evaluate blood flow to the kidneys. Allow one hour for this exam. Do not eat after midnight the day before and avoid carbonated beverages. Take your medications as you usually do.  Follow-Up: Your physician wants you to follow-up in: 1 YEAR with Dr Burt Knack.  You will receive a reminder letter in the mail two months in advance. If you don't receive a letter, please call our office to schedule the follow-up appointment.  Any Other Special Instructions Will Be Listed Below (If Applicable).  Order will be sent to [place] to discontinue oxygen.

## 2014-11-20 NOTE — Progress Notes (Signed)
Cardiology Office Note   Date:  11/20/2014   ID:  Brenda [name], DOB 07/30/1960, MRN [id number]  PCP:  No primary care provider on file.  Cardiologist:  Sherren Mocha, MD    Chief Complaint  Patient presents with  . Leg Pain     History of Present Illness: Brenda [name] is a 54 y.o. female who presents for follow-up evaluation. She was last seen 1 year ago. The patient is followed for extensive peripheral arterial disease. She's undergone carotid bypass, renal artery stenting, and bilateral iliac stenting. She has total occlusion of her superficial femoral arteries. She has significant stenotic disease of both subclavian and lower extremities, so it is always difficult to obtain a blood pressure. The patient is also followed regularly by vascular surgery. A recent duplex study demonstrated patent bilateral carotid endarterectomy sites, patent right subclavian to distal common carotid artery bypass graft, and evidence of left subclavian artery occlusive disease with retrograde vertebral flow. There is no significant change from her previous study one year ago.  The patient was hospitalized just a few weeks ago with acute respiratory failure felt to be secondary to COPD exacerbation and strep pharyngitis. An echocardiogram during this hospitalization demonstrated vigorous LV function and no significant valvular disease. She was discharged on home O2 but hasn't used it at all. States breathing is back to baseline. Wonders if she needs to keep O2.   She continues to have bilateral calf claudication with walking a brisk pace. She can walk slow without any symptoms, especially when she has a cart or stroller to push. Reports no change over the past year. Also reports bilateral hip pain. No resting pain in her legs. No chest pain or pressure. Denies orthopnea, PND, or cough.  Past Medical History  Diagnosis Date  . HTN (hypertension)   . CAD (coronary artery disease)   . Carotid artery  occlusion   . Renal artery stenosis   . PVD (peripheral vascular disease)   . Hypercholesteremia   . Cardiomyopathy   . COPD (chronic obstructive pulmonary disease)     not on home o2  . Pulmonary nodule   . Anemia   . Arthritis   . CHF (congestive heart failure) 2007    Past Surgical History  Procedure Laterality Date  . Cardiac catheterization    . Gallbladder surgery    . External ear surgery      as a child  . Coronary stent placement       Successful stenting of the right renal ostium   complicated by distal wire perforation.  The wire perforation was  treated with embolization of the branch vessels involved.  Will check CT   scan to assess the right renal parenchymal hematoma.  She will be   maintained on both aspirin and Plavix.  Will place a Foley catheter to   avoid clotting since hematuria is likely.      Current Outpatient Prescriptions  Medication Sig Dispense Refill  . acetaminophen (TYLENOL) 500 MG tablet Take 500 mg by mouth every 6 (six) hours as needed for mild pain.    . budesonide-formoterol (SYMBICORT) 160-4.5 MCG/ACT inhaler Inhale 2 puffs into the lungs 2 (two) times daily. 1 Inhaler 12  . carvedilol (COREG) 6.25 MG tablet TAKE 1 TABLET BY MOUTH TWICE DAILY WITH MEALS 60 tablet 3  . furosemide (LASIX) 20 MG tablet TAKE ONE TABLET BY MOUTH EVERY OTHER DAY 30 tablet 5  . guaiFENesin (MUCINEX) 600 MG 12 hr tablet  Take 2 tablets (1,200 mg total) by mouth 2 (two) times daily. 30 tablet 0  . KLOR-CON M20 20 MEQ tablet TAKE ONE TABLET BY MOUTH EVERY OTHER DAY 15 tablet 5  . levofloxacin (LEVAQUIN) 750 MG tablet Take 1 tablet (750 mg total) by mouth daily. 5 tablet 0  . losartan (COZAAR) 50 MG tablet Take 1 tablet (50 mg total) by mouth daily. 90 tablet 3  . predniSONE (STERAPRED UNI-PAK 21 TAB) 10 MG (21) TBPK tablet Take 6-5-4-3-2-1 tablets PO daily till gone 21 tablet 0  . simvastatin (ZOCOR) 40 MG tablet TAKE ONE TABLET BY MOUTH ONCE DAILY 30 tablet 5  .  ticagrelor (BRILINTA) 60 MG TABS tablet Take 1 tablet (60 mg total) by mouth 2 (two) times daily. 60 tablet 5  . tiotropium (SPIRIVA HANDIHALER) 18 MCG inhalation capsule Place 1 capsule (18 mcg total) into inhaler and inhale daily. 30 capsule 6   No current facility-administered medications for this visit.    Allergies:   Lisinopril and Oxycodone   Social History:  The patient  reports that she quit smoking about 9 years ago. Her smoking use included Cigarettes. She has a 31 pack-year smoking history. She has never used smokeless tobacco. She reports that she does not drink alcohol or use illicit drugs.   Family History:  The patient's family history includes Cancer in her father; Coronary artery disease in an other family member; Heart disease (age of onset: 15) in her mother; Hyperlipidemia in her father and mother; Hypertension in her father and mother; Peripheral vascular disease in her mother.    ROS:  Please see the history of present illness.  Otherwise, review of systems is positive for recent fever, chills, and DOE.  All other systems are reviewed and negative.    PHYSICAL EXAM: VS:  BP 120/70 mmHg  Pulse 84  Ht 5\' 5"  (1.651 m)  Wt 209 lb (94.802 kg)  BMI 34.78 kg/m2  SpO2 94% , BMI Body mass index is 34.78 kg/(m^2). GEN: Well nourished, well developed, in no acute distress HEENT: normal Neck: no JVD, no masses. Bilateral carotid bruits are present Cardiac: RRR with grade 2/6 harsh systolic murmur best heard at the right upper sternal border         Respiratory:  clear to auscultation bilaterally, normal work of breathing GI: soft, nontender, nondistended, + BS MS: no deformity or atrophy Ext: no pretibial edema, pulses are diminished throughout Skin: warm and dry, no rash Neuro:  Strength and sensation are intact Psych: euthymic mood, full affect  EKG:  EKG is not ordered today.  Recent Labs: 10/31/2014: B Natriuretic Peptide 79.6 11/01/2014: ALT 15 11/02/2014: BUN  23*; Creatinine, Ser 1.00; Hemoglobin 14.8; Platelets 362; Potassium 4.6; Sodium 137   Lipid Panel     Component Value Date/Time   CHOL 156 11/24/2013 1114   TRIG 191.0* 11/24/2013 1114   HDL 36.50* 11/24/2013 1114   CHOLHDL 4 11/24/2013 1114   VLDL 38.2 11/24/2013 1114   LDLCALC 81 11/24/2013 1114      Wt Readings from Last 3 Encounters:  11/20/14 209 lb (94.802 kg)  11/14/14 209 lb (94.802 kg)  11/02/14 214 lb 8.1 oz (97.3 kg)     Cardiac Studies Reviewed: 2-D echocardiogram 10/31/2014: Left ventricle: The cavity size was normal. There was moderate concentric hypertrophy. Systolic function was vigorous. The estimated ejection fraction was in the range of 65% to 70%. There was no dynamic obstruction. Wall motion was normal; there were no regional wall  motion abnormalities. Doppler parameters are consistent with abnormal left ventricular relaxation (grade 1 diastolic dysfunction).  ------------------------------------------------------------------- Aortic valve:  Trileaflet; normal thickness, mildly calcified leaflets. Mobility was not restricted. Doppler: Transvalvular velocity was within the normal range. There was no stenosis. There was no regurgitation.  ------------------------------------------------------------------- Aorta: Aortic root: The aortic root was normal in size.  ------------------------------------------------------------------- Mitral valve:  Structurally normal valve.  Mobility was not restricted. Doppler: Transvalvular velocity was within the normal range. There was no evidence for stenosis. There was no regurgitation.  Peak gradient (D): 2 mm Hg.  ------------------------------------------------------------------- Left atrium: The atrium was normal in size.  ------------------------------------------------------------------- Right ventricle: The cavity size was at the upper limits of normal. Wall thickness was normal. Systolic  function was normal.  ------------------------------------------------------------------- Pulmonic valve:  Poorly visualized. Structurally normal valve. Cusp separation was normal. Doppler: Transvalvular velocity was within the normal range. There was no evidence for stenosis. There was no regurgitation.  ------------------------------------------------------------------- Tricuspid valve:  Structurally normal valve.  Doppler: Transvalvular velocity was within the normal range. There was no regurgitation.  ------------------------------------------------------------------- Pulmonary artery:  The main pulmonary artery was normal-sized. Systolic pressure was within the normal range.  ------------------------------------------------------------------- Right atrium: The atrium was normal in size.  ------------------------------------------------------------------- Pericardium: There was no pericardial effusion.  ------------------------------------------------------------------- Systemic veins: Poorly visualized.   ASSESSMENT AND PLAN: 1.  Lower extremity peripheral arterial disease with intermittent claudication: Patient is stable with no change in symptoms over the past year. She had participating in the [place] Trial randomizing stable PAD patients to ticagrelor versus clopidogrel. The trial has closed and she is now treated with ticagrelor 60 mg BID. The patient's lower extremity duplex studies were reviewed. ABIs are not able to be accurately calculated because of upper extremity occlusive disease. She has brisk monophasic waveforms bilaterally with ankle pressures of 122 on the right and 138 on the left. We'll continue same regimen.  2. Renal artery stenosis: Patent renal arteries on recent duplex exam. Bilateral kidney size is stable. She has elevated velocities over the right renal artery which are unchanged. Recent creatinine values are normal. Will repeat a renal duplex  next year.  3. Hypertension: Difficult to know about blood pressure control with her occlusive disease in all 4 extremities. She continues on carvedilol and losartan.  4. Hyperlipidemia: Lipids have been at goal. Lifestyle modification reviewed for hypertriglyceridemia. Will update lipids next year. Recent LFTs were drawn and they are within normal limits.  5. Carotid stenosis with history of TIA: Followed by vascular surgery. Most recent duplex scan reviewed as above.  6. Chronic diastolic heart failure: The patient appears stable on her current regimen. She takes Lasix every other day. Recent echocardiogram reviewed.  7. COPD exacerbation/hypoxemic respiratory failure: The patient has had a good recovery. Her resting oxygen saturation is 94%. With ambulation she drops transiently to 89%. She has minimal shortness of breath and reports this is back to baseline. She does not appear to require oxygen anymore we agreed that this could be discontinued. Her lungs are completely clear on exam.  Current medicines are reviewed with the patient today.  The patient does not have concerns regarding medicines.  Labs/ tests ordered today include:   Orders Placed This Encounter  Procedures  . Lipid panel  . Hepatic function panel   Disposition:   FU one year with a renal arterial duplex and Lipid panel prior to the visit.  Deatra James, MD  11/20/2014 10:30 AM    [facility]  [address] [place], [place]  [address] Phone: [phone]; Fax: [phone]

## 2014-11-27 ENCOUNTER — Other Ambulatory Visit: Payer: Self-pay | Admitting: Cardiovascular Disease

## 2014-12-25 ENCOUNTER — Other Ambulatory Visit: Payer: Self-pay | Admitting: Cardiovascular Disease

## 2015-01-12 ENCOUNTER — Other Ambulatory Visit: Payer: Self-pay

## 2015-01-12 ENCOUNTER — Other Ambulatory Visit: Payer: Self-pay | Admitting: Cardiovascular Disease

## 2015-01-12 MED ORDER — SIMVASTATIN 40 MG PO TABS: 40.0000 mg | ORAL_TABLET | Freq: Every day | ORAL | Status: DC

## 2015-03-07 ENCOUNTER — Other Ambulatory Visit: Payer: Self-pay | Admitting: Cardiovascular Disease

## 2015-05-25 ENCOUNTER — Other Ambulatory Visit: Payer: Self-pay | Admitting: Cardiovascular Disease

## 2015-07-19 ENCOUNTER — Other Ambulatory Visit: Payer: Self-pay | Admitting: Cardiovascular Disease

## 2015-09-07 ENCOUNTER — Emergency Department (HOSPITAL_COMMUNITY)
Admission: EM | Admit: 2015-09-07 | Discharge: 2015-09-07 | Disposition: A | Discharge status: Home / Self Care | Payer: Medicare Other | Attending: Emergency Medicine | Admitting: Emergency Medicine

## 2015-09-07 ENCOUNTER — Emergency Department (HOSPITAL_COMMUNITY): Payer: Medicare Other

## 2015-09-07 ENCOUNTER — Encounter (HOSPITAL_COMMUNITY): Payer: Self-pay

## 2015-09-07 ENCOUNTER — Ambulatory Visit (INDEPENDENT_AMBULATORY_CARE_PROVIDER_SITE_OTHER): Payer: Medicare Other | Admitting: Family Medicine

## 2015-09-07 VITALS — BP 112/66 | HR 78 | Temp 97.9°F | Resp 20 | Ht 63.0 in | Wt 200.0 lb

## 2015-09-07 DIAGNOSIS — E7800 Pure hypercholesterolemia, unspecified: Secondary | ICD-10-CM | POA: Diagnosis not present

## 2015-09-07 DIAGNOSIS — Z87891 Personal history of nicotine dependence: Secondary | ICD-10-CM | POA: Insufficient documentation

## 2015-09-07 DIAGNOSIS — I509 Heart failure, unspecified: Secondary | ICD-10-CM | POA: Diagnosis not present

## 2015-09-07 DIAGNOSIS — R1110 Vomiting, unspecified: Secondary | ICD-10-CM

## 2015-09-07 DIAGNOSIS — Z9889 Other specified postprocedural states: Secondary | ICD-10-CM | POA: Diagnosis not present

## 2015-09-07 DIAGNOSIS — Z8679 Personal history of other diseases of the circulatory system: Secondary | ICD-10-CM | POA: Diagnosis not present

## 2015-09-07 DIAGNOSIS — J069 Acute upper respiratory infection, unspecified: Secondary | ICD-10-CM | POA: Diagnosis not present

## 2015-09-07 DIAGNOSIS — M1990 Unspecified osteoarthritis, unspecified site: Secondary | ICD-10-CM | POA: Diagnosis not present

## 2015-09-07 DIAGNOSIS — I10 Essential (primary) hypertension: Secondary | ICD-10-CM | POA: Insufficient documentation

## 2015-09-07 DIAGNOSIS — R05 Cough: Secondary | ICD-10-CM | POA: Diagnosis not present

## 2015-09-07 DIAGNOSIS — J449 Chronic obstructive pulmonary disease, unspecified: Secondary | ICD-10-CM

## 2015-09-07 DIAGNOSIS — Z79899 Other long term (current) drug therapy: Secondary | ICD-10-CM | POA: Insufficient documentation

## 2015-09-07 DIAGNOSIS — E669 Obesity, unspecified: Secondary | ICD-10-CM | POA: Diagnosis not present

## 2015-09-07 DIAGNOSIS — I739 Peripheral vascular disease, unspecified: Secondary | ICD-10-CM

## 2015-09-07 DIAGNOSIS — I2510 Atherosclerotic heart disease of native coronary artery without angina pectoris: Secondary | ICD-10-CM | POA: Insufficient documentation

## 2015-09-07 DIAGNOSIS — I447 Left bundle-branch block, unspecified: Secondary | ICD-10-CM

## 2015-09-07 DIAGNOSIS — Z862 Personal history of diseases of the blood and blood-forming organs and certain disorders involving the immune mechanism: Secondary | ICD-10-CM | POA: Diagnosis not present

## 2015-09-07 DIAGNOSIS — R112 Nausea with vomiting, unspecified: Secondary | ICD-10-CM | POA: Diagnosis not present

## 2015-09-07 DIAGNOSIS — R0602 Shortness of breath: Secondary | ICD-10-CM | POA: Diagnosis not present

## 2015-09-07 LAB — TROPONIN I: Troponin I: <0.03 ng/mL (ref <0.031)

## 2015-09-07 LAB — URINALYSIS, ROUTINE W REFLEX MICROSCOPIC
BILIRUBIN URINE: NEGATIVE
Glucose, UA: NEGATIVE mg/dL
HGB URINE DIPSTICK: NEGATIVE
Ketones, ur: NEGATIVE mg/dL
Leukocytes, UA: NEGATIVE
NITRITE: NEGATIVE
PROTEIN: NEGATIVE mg/dL
Specific Gravity, Urine: 1.022 (ref 1.005–1.030)
pH: 5 (ref 5.0–8.0)

## 2015-09-07 LAB — CBC WITH DIFFERENTIAL/PLATELET
Basophils Absolute: 0 10*3/uL (ref 0.0–0.1)
Basophils Relative: 1 %
Eosinophils Absolute: 0 10*3/uL (ref 0.0–0.7)
Eosinophils Relative: 1 %
HCT: 42.9 % (ref 36.0–46.0)
HEMOGLOBIN: 14.3 g/dL (ref 12.0–15.0)
LYMPHS ABS: 0.9 10*3/uL (ref 0.7–4.0)
LYMPHS PCT: 14 %
MCH: 31.1 pg (ref 26.0–34.0)
MCHC: 33.3 g/dL (ref 30.0–36.0)
MCV: 93.3 fL (ref 78.0–100.0)
Monocytes Absolute: 0.8 10*3/uL (ref 0.1–1.0)
Monocytes Relative: 13 %
NEUTROS PCT: 71 %
Neutro Abs: 4.6 10*3/uL (ref 1.7–7.7)
Platelets: 222 10*3/uL (ref 150–400)
RBC: 4.6 MIL/uL (ref 3.87–5.11)
RDW: 13.7 % (ref 11.5–15.5)
WBC: 6.4 10*3/uL (ref 4.0–10.5)

## 2015-09-07 LAB — BASIC METABOLIC PANEL
ANION GAP: 10 (ref 5–15)
BUN: 12 mg/dL (ref 6–20)
CHLORIDE: 107 mmol/L (ref 101–111)
CO2: 21 mmol/L — AB (ref 22–32)
Calcium: 8.9 mg/dL (ref 8.9–10.3)
Creatinine, Ser: 1.11 mg/dL — ABNORMAL HIGH (ref 0.44–1.00)
GFR calc non Af Amer: 55 mL/min — ABNORMAL LOW (ref >60)
Glucose, Bld: 113 mg/dL — ABNORMAL HIGH (ref 65–99)
Potassium: 3.8 mmol/L (ref 3.5–5.1)
Sodium: 138 mmol/L (ref 135–145)

## 2015-09-07 LAB — LACTIC ACID, PLASMA: LACTIC ACID, VENOUS: 0.9 mmol/L (ref 0.5–2.0)

## 2015-09-07 LAB — D-DIMER, QUANTITATIVE: D-Dimer, Quant: 0.38 ug/mL-FEU (ref 0.00–0.50)

## 2015-09-07 MED ORDER — IPRATROPIUM-ALBUTEROL 0.5-2.5 (3) MG/3ML IN SOLN
3.0000 mL | Freq: Once | RESPIRATORY_TRACT | Status: AC
  Administered 2015-09-07: 3 mL via RESPIRATORY_TRACT
  Filled 2015-09-07 (×2): qty 3

## 2015-09-07 MED ORDER — AZITHROMYCIN 250 MG PO TABS: ORAL_TABLET | ORAL | Status: DC

## 2015-09-07 MED ORDER — ALBUTEROL SULFATE HFA 108 (90 BASE) MCG/ACT IN AERS
1.0000 | INHALATION_SPRAY | Freq: Four times a day (QID) | RESPIRATORY_TRACT | Status: AC | PRN
Start: 2015-09-07 — End: ?

## 2015-09-07 MED ORDER — PREDNISONE 50 MG PO TABS: ORAL_TABLET | ORAL | Status: DC

## 2015-09-07 MED ORDER — METHYLPREDNISOLONE SODIUM SUCC 125 MG IJ SOLR
125.0000 mg | Freq: Once | INTRAMUSCULAR | Status: AC
  Administered 2015-09-07: 125 mg via INTRAVENOUS
  Filled 2015-09-07: qty 2

## 2015-09-07 MED ORDER — SODIUM CHLORIDE 0.9 % IV BOLUS (SEPSIS)
1000.0000 mL | Freq: Once | INTRAVENOUS | Status: AC
  Administered 2015-09-07: 1000 mL via INTRAVENOUS

## 2015-09-07 NOTE — Patient Instructions (Addendum)
     IF you received an x-ray today, you will receive an invoice from [facility]. Please contact [facility] at [phone] with questions or concerns regarding your invoice.   IF you received labwork today, you will receive an invoice from [employer]. Please contact [facility] at [phone] with questions or concerns regarding your invoice.   Our billing staff will not be able to assist you with questions regarding bills from these companies.  You will be contacted with the lab results as soon as they are available. The fastest way to get your results is to activate your My Chart account. Instructions are located on the last page of this paperwork. If you have not heard from [place] regarding the results in 2 weeks, please contact this office.     You will be evaluated through emergency room today for your shortness of breath.  If follow-up needed after the emergency room, can return here or your cardiologist.

## 2015-09-07 NOTE — ED Notes (Signed)
[facility]- pt here from [facility] with shortness of breath and cough. Pt has hx of COPD and CHF. Pt reports recently taking care of a grandchild who was sick.

## 2015-09-07 NOTE — ED Notes (Signed)
Dr. Lacinda Axon made aware of BP.

## 2015-09-07 NOTE — Discharge Instructions (Signed)
Tests showed no life-threatening condition. Prescription for antibiotic, prednisone, inhaler. Rest. Return if worse.

## 2015-09-07 NOTE — Progress Notes (Addendum)
Subjective:  This chart was scribed for Merri Ray MD, by Tamsen Roers, at [facility].  This patient was seen in room 9 and the patient's care was started at 10:47 AM.   Chief Complaint  Patient presents with  . Shortness of Breath    Began this morning      Patient ID: Brenda [name], female    DOB: 03/23/1961, 55 y.o.   MRN: [id number]  HPI HPI Comments:  Patient was brought back acutely to room 9 due to shortness of breath, respiratory distress, and I was asked to see her urgently.  Brenda [name] is a 55 y.o. female who presents to the [facility] complaining of  shortness of breath onset 9:30 AM this morning. States that her shortness of breath worsens with an increase in activity. She has also been dry heaving since yesterday but no sweating.   Patient states that her symptoms today feel like her COPD flaring up as she had similar symptoms last year.  Last year, she was diagnosed with strep throat with flared up her COPD. Patient believes that her symptoms of a cold (Headache, rhinorrhea,upset stomache and watery eyes onset two days ago) may have caused her symptoms today. Patient used her inhaler this morning (Spriva) and uses it daily. Denies chest pain, wheezing.  Patient has had carotid artery surgery.   Patient drove to [place] in the car March 5th, no history of blood clots.  She denies any calf pain.  ------  She has a history of hypertension CAD, COPD and CHF.  Patients last echo was in May 2016. EF 65-70% and no wall motion abnormalities were present. She has a history of extensive peripheral arterial disease.    Patient Active Problem List   Diagnosis Date Noted  . Chronic diastolic CHF (congestive heart failure) ([place]) 11/02/2014  . Acute respiratory failure with hypoxia ([place]) 11/01/2014  . COPD exacerbation ([place]) 10/31/2014  . Hyperglycemia 10/31/2014  . Polycythemia 10/31/2014  . Strep throat   . Research subject  10/10/2011  . ANEMIA, CHRONIC 11/03/2008  . HYPERTENSION 11/03/2008  . CAD 11/03/2008  . CAROTID ARTERY OCCLUSION 11/03/2008  . PULMONARY NODULE, SOLITARY 11/03/2008  . HYPERCHOLESTEROLEMIA 10/20/2008  . CARDIOMYOPATHY 10/20/2008  . COPD 10/20/2008  . RENAL ARTERY STENOSIS 10/09/2008  . Atherosclerosis of native arteries of the extremities with intermittent claudication 10/09/2008   Past Medical History  Diagnosis Date  . HTN (hypertension)   . CAD (coronary artery disease)   . Carotid artery occlusion   . Renal artery stenosis ([place])   . PVD (peripheral vascular disease) ([place])   . Hypercholesteremia   . Cardiomyopathy   . COPD (chronic obstructive pulmonary disease) (H[facility])     not on home o2  . Pulmonary nodule   . Anemia   . Arthritis   . CHF (congestive heart failure) ([place]) 2007   Past Surgical History  Procedure Laterality Date  . Cardiac catheterization    . Gallbladder surgery    . External ear surgery      as a child  . Coronary stent placement       Successful stenting of the right renal ostium   complicated by distal wire perforation.  The wire perforation was  treated with embolization of the branch vessels involved.  Will check CT   scan to assess the right renal parenchymal hematoma.  She will be   maintained on both aspirin and Plavix.  Will place  a Foley catheter to   avoid clotting since hematuria is likely.     Allergies  Allergen Reactions  . Lisinopril Cough    REACTION: D/C DUE TO COUGH/MC  . Oxycodone Nausea And Vomiting    Vomiting    Prior to Admission medications   Medication Sig Start Date End Date Taking? Authorizing Provider  acetaminophen (TYLENOL) 500 MG tablet Take 500 mg by mouth every 6 (six) hours as needed for mild pain.   Yes Historical Provider, MD  BRILINTA 60 MG TABS tablet TAKE ONE TABLET BY MOUTH TWICE DAILY 05/25/15  Yes Sherren Mocha, MD  budesonide-formoterol [facility]) 160-4.5 MCG/ACT inhaler Inhale 2 puffs into the lungs 2  (two) times daily. 11/02/14  Yes Verlee Monte, MD  carvedilol (COREG) 6.25 MG tablet TAKE ONE TABLET BY MOUTH TWICE DAILY WITH MEALS 12/25/14  Yes Sherren Mocha, MD  furosemide (LASIX) 20 MG tablet TAKE ONE TABLET BY MOUTH EVERY OTHER DAY 07/19/15  Yes Sherren Mocha, MD  guaiFENesin (MUCINEX) 600 MG 12 hr tablet Take 2 tablets (1,200 mg total) by mouth 2 (two) times daily. 11/02/14  Yes Verlee Monte, MD  KLOR-CON M20 20 MEQ tablet TAKE ONE TABLET BY MOUTH EVERY OTHER DAY 03/07/15  Yes Sherren Mocha, MD  losartan (COZAAR) 50 MG tablet TAKE ONE TABLET BY MOUTH ONCE DAILY 11/28/14  Yes Sherren Mocha, MD  simvastatin (ZOCOR) 40 MG tablet Take 1 tablet (40 mg total) by mouth daily. 01/12/15  Yes Sherren Mocha, MD  tiotropium (SPIRIVA HANDIHALER) 18 MCG inhalation capsule Place 1 capsule (18 mcg total) into inhaler and inhale daily. 10/27/14  Yes Sherren Mocha, MD  levofloxacin (LEVAQUIN) 750 MG tablet Take 1 tablet (750 mg total) by mouth daily. Patient not taking: Reported on 09/07/2015 11/02/14   Verlee Monte, MD  predniSONE (STERAPRED UNI-PAK 21 TAB) 10 MG (21) TBPK tablet Take 6-5-4-3-2-1 tablets PO daily till gone Patient not taking: Reported on 09/07/2015 11/02/14   Verlee Monte, MD   Social History   Social History  . Marital Status: Married    Spouse Name: N/A  . Number of Children: N/A  . Years of Education: N/A   Occupational History  . Not on file.   Social History Main Topics  . Smoking status: Former Smoker -- 1.00 packs/day for 31 years    Types: Cigarettes    Quit date: 06/09/2005  . Smokeless tobacco: Never Used  . Alcohol Use: No  . Drug Use: No  . Sexual Activity: Not Currently   Other Topics Concern  . Not on file   Social History Narrative      Review of Systems  Constitutional: Negative for fever and chills.  HENT: Positive for rhinorrhea.   Eyes: Negative for redness.  Respiratory: Positive for shortness of breath. Negative for choking and wheezing.     Cardiovascular: Negative for chest pain.  Gastrointestinal: Negative for nausea and vomiting.  Skin: Negative for color change.  Neurological: Positive for headaches. Negative for syncope and speech difficulty.       Objective:   Physical Exam  Constitutional: She is oriented to person, place, and time.  HENT:  Head: Normocephalic and atraumatic.  Eyes: Pupils are equal, round, and reactive to light.  Cardiovascular: Normal rate.   Pulmonary/Chest: Effort normal and breath sounds normal. No respiratory distress. She has no wheezes. She has no rales.  Lungs are clear no wheezing or rales.  Slightly uncomfortable but no retractions.     Neurological: She is alert and oriented  to person, place, and time.  Psychiatric: She has a normal mood and affect. Her behavior is normal.   Filed Vitals:   09/07/15 1040  BP: 112/66  Pulse: 78  Temp: 97.9 F (36.6 C)  TempSrc: Oral  Resp: 20  Height: 5\' 3"  (1.6 m)  Weight: 200 lb (90.719 kg)  SpO2: 93%   EKG: sinus rhythm rate 75,  left bundle branch block.   Noted on previous EKG.     Assessment & Plan:  Brenda [name] is a 55 y.o. female SOB (shortness of breath) - Plan: EKG 12-Lead  Left bundle branch block  Dry heaves  History of CHF (congestive heart failure)  PAD (peripheral artery disease) (HCC)  Chronic obstructive pulmonary disease, unspecified COPD type ([place])  History of COPD, extensive PAD by history, CHF, and CAD noted in chart without known previous MI or PTCA. Acute onset of shortness of breath at 9:30 this morning without chest pain or tightness, but also no wheeze and no wheezing or rales noted on exam.  Has had dry heaves since yesterday and noted in the office as well. Reports recent mild URI symptoms, but acute onset of shortness of breath today. Initial EKG thought to be a new left bundle branch block, but this was compared to previous EKGs, and noted that she has had a left bundle branch block previously.  History also significant for prolonged car ride to [place] a few weeks ago, but denies any calf pain, swelling, or previous DVT. Wth her acute onset of dyspnea and no wheeze or abnormal lung sounds on exam, concerning for possible anginal equivalent, less likely PE, and possible COPD exacerbation.  Will need further evaluation through the emergency room.Repeat O2 sat was stable 95%, oxygen was not given. Hemodynamically stable during office visit.  - IV was placed, EMS called for transport. Report given to EMS with transfer of care and charge nurse advised at approximately 11:25 AM.  No orders of the defined types were placed in this encounter.   Patient Instructions       IF you received an x-ray today, you will receive an invoice from [facility]. Please contact [facility] at [phone] with questions or concerns regarding your invoice.   IF you received labwork today, you will receive an invoice from [employer]. Please contact [facility] at [phone] with questions or concerns regarding your invoice.   Our billing staff will not be able to assist you with questions regarding bills from these companies.  You will be contacted with the lab results as soon as they are available. The fastest way to get your results is to activate your My Chart account. Instructions are located on the last page of this paperwork. If you have not heard from [place] regarding the results in 2 weeks, please contact this office.     You will be evaluated through emergency room today for your shortness of breath.  If follow-up needed after the emergency room, can return here or your cardiologist.      I personally performed the services described in this documentation, which was scribed in my presence. The recorded information has been reviewed and considered, and addended by me as needed.

## 2015-09-07 NOTE — ED Provider Notes (Signed)
CSN: [id number]     Arrival date & time 09/07/15  1159 History   First MD Initiated Contact with Patient 09/07/15 1206     Chief Complaint  Patient presents with  . Shortness of Breath     (Consider location/radiation/quality/duration/timing/severity/associated sxs/prior Treatment) HPI.Marland KitchenMarland KitchenMarland KitchenCough, shortness of breath for 24 hours. Her grandchild has been sick with a similar syndrome. No substernal chest pain, diaphoresis, nausea. No fever, sweats, chills, rusty sputum. Her blood pressure tends to run low. Severity of symptoms mild to moderate.  Past Medical History  Diagnosis Date  . HTN (hypertension)   . CAD (coronary artery disease)   . Carotid artery occlusion   . Renal artery stenosis ([place])   . PVD (peripheral vascular disease) ([place])   . Hypercholesteremia   . Cardiomyopathy   . COPD (chronic obstructive pulmonary disease) (H[facility])     not on home o2  . Pulmonary nodule   . Anemia   . Arthritis   . CHF (congestive heart failure) ([place]) 2007   Past Surgical History  Procedure Laterality Date  . Cardiac catheterization    . Gallbladder surgery    . External ear surgery      as a child  . Coronary stent placement       Successful stenting of the right renal ostium   complicated by distal wire perforation.  The wire perforation was  treated with embolization of the branch vessels involved.  Will check CT   scan to assess the right renal parenchymal hematoma.  She will be   maintained on both aspirin and Plavix.  Will place a Foley catheter to   avoid clotting since hematuria is likely.     Family History  Problem Relation Age of Onset  . Coronary artery disease    . Heart disease Mother 35    Heart Disease before age 21  . Hypertension Mother   . Hyperlipidemia Mother   . Peripheral vascular disease Mother   . Cancer Father   . Hyperlipidemia Father   . Hypertension Father    Social History  Substance Use Topics  . Smoking status: Former Smoker -- 1.00 packs/day for 31  years    Types: Cigarettes    Quit date: 06/09/2005  . Smokeless tobacco: Never Used  . Alcohol Use: No   OB History    No data available     Review of Systems  All other systems reviewed and are negative.     Allergies  Lisinopril and Oxycodone  Home Medications   Prior to Admission medications   Medication Sig Start Date End Date Taking? Authorizing Provider  acetaminophen (TYLENOL) 500 MG tablet Take 500 mg by mouth every 6 (six) hours as needed for mild pain.   Yes Historical Provider, MD  BRILINTA 60 MG TABS tablet TAKE ONE TABLET BY MOUTH TWICE DAILY 05/25/15  Yes Sherren Mocha, MD  budesonide-formoterol [facility]) 160-4.5 MCG/ACT inhaler Inhale 2 puffs into the lungs 2 (two) times daily. 11/02/14  Yes Verlee Monte, MD  carvedilol (COREG) 6.25 MG tablet TAKE ONE TABLET BY MOUTH TWICE DAILY WITH MEALS 12/25/14  Yes Sherren Mocha, MD  furosemide (LASIX) 20 MG tablet TAKE ONE TABLET BY MOUTH EVERY OTHER DAY 07/19/15  Yes Sherren Mocha, MD  KLOR-CON M20 20 MEQ tablet TAKE ONE TABLET BY MOUTH EVERY OTHER DAY 03/07/15  Yes Sherren Mocha, MD  losartan (COZAAR) 50 MG tablet TAKE ONE TABLET BY MOUTH ONCE DAILY 11/28/14  Yes Sherren Mocha, MD  simvastatin ([place]) 40  MG tablet Take 1 tablet (40 mg total) by mouth daily. 01/12/15  Yes Sherren Mocha, MD  tiotropium (SPIRIVA HANDIHALER) 18 MCG inhalation capsule Place 1 capsule (18 mcg total) into inhaler and inhale daily. 10/27/14  Yes Sherren Mocha, MD  albuterol (PROVENTIL HFA;VENTOLIN HFA) 108 (90 Base) MCG/ACT inhaler Inhale 1-2 puffs into the lungs every 6 (six) hours as needed for wheezing or shortness of breath. 09/07/15   Nat Christen, MD  azithromycin [facility]) 250 MG tablet As directed 09/07/15   Nat Christen, MD  guaiFENesin (MUCINEX) 600 MG 12 hr tablet Take 2 tablets (1,200 mg total) by mouth 2 (two) times daily. Patient not taking: Reported on 09/07/2015 11/02/14   Verlee Monte, MD  levofloxacin (LEVAQUIN) 750 MG tablet Take 1  tablet (750 mg total) by mouth daily. Patient not taking: Reported on 09/07/2015 11/02/14   Verlee Monte, MD  predniSONE (DELTASONE) 50 MG tablet 1 tablet daily for 7 days, one half tablet daily for 7 days 09/07/15   Nat Christen, MD   BP 108/69 mmHg  Pulse 67  Temp(Src) 99.9 F (37.7 C) (Oral)  Resp 16  Wt 207 lb 6.4 oz (94.076 kg)  SpO2 96% Physical Exam  Constitutional: She is oriented to person, place, and time.  Obese, no acute distress, good color  HENT:  Head: Normocephalic and atraumatic.  Eyes: Conjunctivae and EOM are normal. Pupils are equal, round, and reactive to light.  Neck: Normal range of motion. Neck supple.  Cardiovascular: Normal rate and regular rhythm.   Pulmonary/Chest: Effort normal and breath sounds normal.  Abdominal: Soft. Bowel sounds are normal.  Musculoskeletal: Normal range of motion.  Neurological: She is alert and oriented to person, place, and time.  Skin: Skin is warm and dry.  Psychiatric: She has a normal mood and affect. Her behavior is normal.  Nursing note and vitals reviewed.   ED Course  Procedures (including critical care time) Labs Review Labs Reviewed  BASIC METABOLIC PANEL - Abnormal; Notable for the following:    CO2 21 (*)    Glucose, Bld 113 (*)    Creatinine, Ser 1.11 (*)    GFR calc non Af Amer 55 (*)    All other components within normal limits  URINALYSIS, ROUTINE W REFLEX MICROSCOPIC (NOT AT [facility]) - Abnormal; Notable for the following:    APPearance CLOUDY (*)    All other components within normal limits  CBC WITH DIFFERENTIAL/PLATELET  TROPONIN I  D-DIMER, QUANTITATIVE (NOT AT [facility])  LACTIC ACID, PLASMA  LACTIC ACID, PLASMA    Imaging Review Dg Chest 2 View  09/07/2015  CLINICAL DATA:  Shortness of breath today, cough EXAM: CHEST  2 VIEW COMPARISON:  10/31/2014 FINDINGS: Heart is upper limits normal in size. No confluent airspace opacities or effusions. No acute bony abnormality. Biapical scarring. IMPRESSION: No  active cardiopulmonary disease. Electronically Signed   By: Rolm Baptise M.D.   On: 09/07/2015 13:20   I have personally reviewed and evaluated these images and lab results as part of my medical decision-making.   EKG Interpretation   Date/Time:  Friday September 07 2015 12:02:48 EDT Ventricular Rate:  73 PR Interval:  200 QRS Duration: 150 QT Interval:  448 QTC Calculation: 494 R Axis:   57 Text Interpretation:  Sinus rhythm IVCD, consider atypical LBBB Confirmed  by Cresencia Asmus  MD, Woodley Petzold ([id number]) on 09/07/2015 12:15:39 PM      MDM   Final diagnoses:  URI (upper respiratory infection)    Patient is in  no acute distress. Screening tests including labs, troponin, d-dimer, EKG, chest x-ray all negative.  Will start antibiotic secondary to immunocompromised state. Also Rx for prednisone and albuterol inhaler.    Nat Christen, MD 09/07/15 [phone]

## 2015-11-15 ENCOUNTER — Other Ambulatory Visit: Payer: Self-pay | Admitting: Cardiovascular Disease

## 2015-11-15 ENCOUNTER — Encounter: Payer: Self-pay | Admitting: Family

## 2015-11-15 DIAGNOSIS — I701 Atherosclerosis of renal artery: Secondary | ICD-10-CM

## 2015-11-19 ENCOUNTER — Ambulatory Visit (HOSPITAL_COMMUNITY)
Admission: RE | Admit: 2015-11-19 | Discharge: 2015-11-19 | Disposition: A | Discharge status: Home / Self Care | Payer: Medicare Other | Source: Ambulatory Visit | Attending: Cardiology | Admitting: Cardiology

## 2015-11-19 DIAGNOSIS — J449 Chronic obstructive pulmonary disease, unspecified: Secondary | ICD-10-CM | POA: Insufficient documentation

## 2015-11-19 DIAGNOSIS — I2510 Atherosclerotic heart disease of native coronary artery without angina pectoris: Secondary | ICD-10-CM | POA: Diagnosis not present

## 2015-11-19 DIAGNOSIS — I700 Atherosclerosis of aorta: Secondary | ICD-10-CM | POA: Diagnosis not present

## 2015-11-19 DIAGNOSIS — Z87891 Personal history of nicotine dependence: Secondary | ICD-10-CM | POA: Insufficient documentation

## 2015-11-19 DIAGNOSIS — I701 Atherosclerosis of renal artery: Secondary | ICD-10-CM

## 2015-11-19 DIAGNOSIS — I10 Essential (primary) hypertension: Secondary | ICD-10-CM | POA: Diagnosis not present

## 2015-11-19 DIAGNOSIS — Z95820 Peripheral vascular angioplasty status with implants and grafts: Secondary | ICD-10-CM | POA: Insufficient documentation

## 2015-11-19 DIAGNOSIS — E785 Hyperlipidemia, unspecified: Secondary | ICD-10-CM | POA: Insufficient documentation

## 2015-11-19 DIAGNOSIS — I70203 Unspecified atherosclerosis of native arteries of extremities, bilateral legs: Secondary | ICD-10-CM | POA: Diagnosis not present

## 2015-11-19 DIAGNOSIS — I739 Peripheral vascular disease, unspecified: Secondary | ICD-10-CM

## 2015-11-20 ENCOUNTER — Ambulatory Visit (HOSPITAL_COMMUNITY): Admission: RE | Admit: 2015-11-20 | Payer: Medicare Other | Source: Ambulatory Visit

## 2015-11-20 ENCOUNTER — Ambulatory Visit: Payer: Medicare Other | Admitting: Family

## 2015-11-22 ENCOUNTER — Encounter: Payer: Self-pay | Admitting: Cardiovascular Disease

## 2015-11-22 NOTE — Telephone Encounter (Signed)
This encounter was created in error - please disregard.

## 2015-11-22 NOTE — Telephone Encounter (Signed)
F/u  Pt returning Rn phone call- test results. Please call back and discuss.

## 2015-12-10 ENCOUNTER — Ambulatory Visit (HOSPITAL_COMMUNITY)
Admission: RE | Admit: 2015-12-10 | Discharge: 2015-12-10 | Disposition: A | Discharge status: Home / Self Care | Payer: Medicare Other | Source: Ambulatory Visit | Attending: Family | Admitting: Family

## 2015-12-10 DIAGNOSIS — Z87891 Personal history of nicotine dependence: Secondary | ICD-10-CM | POA: Insufficient documentation

## 2015-12-10 DIAGNOSIS — I708 Atherosclerosis of other arteries: Secondary | ICD-10-CM

## 2015-12-10 DIAGNOSIS — Z9889 Other specified postprocedural states: Secondary | ICD-10-CM | POA: Diagnosis not present

## 2015-12-10 DIAGNOSIS — Z95828 Presence of other vascular implants and grafts: Secondary | ICD-10-CM | POA: Diagnosis not present

## 2015-12-10 DIAGNOSIS — I771 Stricture of artery: Secondary | ICD-10-CM

## 2015-12-10 LAB — VAS US CAROTID
LCCAPDIAS: 28 cm/s
LEFT ECA DIAS: -14 cm/s
LICADDIAS: -23 cm/s
LICADSYS: -76 cm/s
LICAPDIAS: -24 cm/s
LICAPSYS: -72 cm/s
Left CCA dist dias: -17 cm/s
Left CCA dist sys: -127 cm/s
Left CCA prox sys: 122 cm/s
RCCAPDIAS: 12 cm/s
RIGHT CCA MID DIAS: -11 cm/s
RIGHT ECA DIAS: -14 cm/s
Right CCA prox sys: 72 cm/s
Right cca dist sys: -97 cm/s

## 2015-12-18 ENCOUNTER — Encounter: Payer: Self-pay | Admitting: Family

## 2015-12-21 ENCOUNTER — Encounter: Payer: Self-pay | Admitting: Family

## 2015-12-21 ENCOUNTER — Ambulatory Visit (INDEPENDENT_AMBULATORY_CARE_PROVIDER_SITE_OTHER): Payer: Medicare Other | Admitting: Family

## 2015-12-21 VITALS — BP 122/76 | HR 79 | Temp 96.3°F | Resp 20 | Ht 63.0 in | Wt 202.0 lb

## 2015-12-21 DIAGNOSIS — Z9889 Other specified postprocedural states: Secondary | ICD-10-CM

## 2015-12-21 DIAGNOSIS — Z87891 Personal history of nicotine dependence: Secondary | ICD-10-CM | POA: Diagnosis not present

## 2015-12-21 DIAGNOSIS — I708 Atherosclerosis of other arteries: Secondary | ICD-10-CM

## 2015-12-21 DIAGNOSIS — Z48812 Encounter for surgical aftercare following surgery on the circulatory system: Secondary | ICD-10-CM

## 2015-12-21 DIAGNOSIS — I701 Atherosclerosis of renal artery: Secondary | ICD-10-CM

## 2015-12-21 DIAGNOSIS — I6523 Occlusion and stenosis of bilateral carotid arteries: Secondary | ICD-10-CM

## 2015-12-21 DIAGNOSIS — I771 Stricture of artery: Secondary | ICD-10-CM

## 2015-12-21 NOTE — Progress Notes (Signed)
Chief Complaint: Follow up Extracranial Carotid Artery Stenosis   History of Present Illness  Brenda [name] is a 55 y.o. female patient of Dr. Kellie Simmering who is s/p bilateral carotid endarterectomies by Dr. Drucie Opitz most recently in 2009. She also had a right carotid to subclavian bypass and re\re anastomosis of right vertebral artery. She denies arm claudication symptoms, denies non healing wounds in extremities.   If she walks slowly she has no claudication in her calves, but if she walks fast she gets bilateral cramping in calves in about 3 minutes. Pt states she has stents in both renal and iliac arteries, placed by Dr. Burt Knack per pt. Review of records: Dr. Burt Knack has been ordering and reviewing results of renal artery Duplex and ABI's.   The patient reports history of TIA symptoms before the 2009 CEA as manifested by slurred speech and dizziness, lasted a few seconds with no remaining neurological deficits.The patient denies a history of amaurosis fugax or monocular blindness, unilateralfacial drooping, or hemiplegia.  The patient denies New Medical or Surgical History.  Pt Diabetic: no Pt smoker: former smoker, quit in 2007  Pt meds include: Statin : yes ASA: no Other anticoagulants/antiplatelets: Brilinta prescribed by Dr. Burt Knack since pt had renal artery stents placed, per pt; she is also taking this for PAOD    Past Medical History  Diagnosis Date  . HTN (hypertension)   . CAD (coronary artery disease)   . Carotid artery occlusion   . Renal artery stenosis ([place])   . PVD (peripheral vascular disease) ([place])   . Hypercholesteremia   . Cardiomyopathy   . COPD (chronic obstructive pulmonary disease) (HCC)     not on home o2  . Pulmonary nodule   . Anemia   . Arthritis   . CHF (congestive heart failure) ([place]) 2007    Social History Social History  Substance Use Topics  . Smoking status: Former Smoker -- 1.00 packs/day for 31 years    Types: Cigarettes    Quit  date: 06/09/2005  . Smokeless tobacco: Never Used  . Alcohol Use: No    Family History Family History  Problem Relation Age of Onset  . Coronary artery disease    . Heart disease Mother 96    Heart Disease before age 80  . Hypertension Mother   . Hyperlipidemia Mother   . Peripheral vascular disease Mother   . Cancer Father   . Hyperlipidemia Father   . Hypertension Father     Surgical History Past Surgical History  Procedure Laterality Date  . Cardiac catheterization    . Gallbladder surgery    . External ear surgery      as a child  . Coronary stent placement       Successful stenting of the right renal ostium   complicated by distal wire perforation.  The wire perforation was  treated with embolization of the branch vessels involved.  Will check CT   scan to assess the right renal parenchymal hematoma.  She will be   maintained on both aspirin and Plavix.  Will place a Foley catheter to   avoid clotting since hematuria is likely.      Allergies  Allergen Reactions  . Lisinopril Cough    REACTION: D/C DUE TO COUGH/MC  . Oxycodone Nausea And Vomiting    Vomiting     Current Outpatient Prescriptions  Medication Sig Dispense Refill  . acetaminophen (TYLENOL) 500 MG tablet Take 500 mg by mouth every 6 (six)  hours as needed for mild pain.    Marland Kitchen albuterol (PROVENTIL HFA;VENTOLIN HFA) 108 (90 Base) MCG/ACT inhaler Inhale 1-2 puffs into the lungs every 6 (six) hours as needed for wheezing or shortness of breath. 1 Inhaler 1  . BRILINTA 60 MG TABS tablet TAKE ONE TABLET BY MOUTH TWICE DAILY 60 tablet 5  . budesonide-formoterol (SYMBICORT) 160-4.5 MCG/ACT inhaler Inhale 2 puffs into the lungs 2 (two) times daily. 1 Inhaler 12  . carvedilol (COREG) 6.25 MG tablet TAKE ONE TABLET BY MOUTH TWICE DAILY WITH MEALS 60 tablet 11  . furosemide (LASIX) 20 MG tablet TAKE ONE TABLET BY MOUTH EVERY OTHER DAY 15 tablet 3  . guaiFENesin (MUCINEX) 600 MG 12 hr tablet Take 2 tablets (1,200 mg  total) by mouth 2 (two) times daily. 30 tablet 0  . KLOR-CON M20 20 MEQ tablet TAKE ONE TABLET BY MOUTH EVERY OTHER DAY 15 tablet 9  . losartan (COZAAR) 50 MG tablet TAKE ONE TABLET BY MOUTH ONCE DAILY 90 tablet 4  . predniSONE (DELTASONE) 50 MG tablet 1 tablet daily for 7 days, one half tablet daily for 7 days 11 tablet 0  . simvastatin (ZOCOR) 40 MG tablet Take 1 tablet (40 mg total) by mouth daily. 30 tablet 11  . tiotropium (SPIRIVA HANDIHALER) 18 MCG inhalation capsule Place 1 capsule (18 mcg total) into inhaler and inhale daily. 30 capsule 6  . azithromycin (ZITHROMAX) 250 MG tablet As directed (Patient not taking: Reported on 12/21/2015) 6 each 0  . levofloxacin (LEVAQUIN) 750 MG tablet Take 1 tablet (750 mg total) by mouth daily. (Patient not taking: Reported on 12/21/2015) 5 tablet 0   No current facility-administered medications for this visit.    Review of Systems : See HPI for pertinent positives and negatives.  Physical Examination  Filed Vitals:   12/21/15 1619  BP: 122/76  Pulse: 79  Temp: 96.3 F (35.7 C)  TempSrc: Oral  Resp: 20  Height: 5\' 3"  (1.6 m)  Weight: 202 lb (91.627 kg)  SpO2: 95%   Body mass index is 35.79 kg/(m^2).  General: WDWN female in NAD GAIT: normal Eyes: PERRLA Pulmonary: Respirations are non-labored, CTAB Cardiac: Regular rhythm,no detected murmur.  VASCULAR EXAM Carotid Bruits Right Left   Positive Negative   Aorta is not palpable. Radial pulses: right is 2+ palpable; left radial pulse is 1+ palpable, left brachial pulse is faintly palpable.      LE Pulses Right Left   FEMORAL not palpable not palpable    POPLITEAL not palpable  not palpable   POSTERIOR TIBIAL  1+palpable  1+ palpable    DORSALIS PEDIS  ANTERIOR TIBIAL  1+palpable   1+palpable      Gastrointestinal: soft, nontender, BS WNL, no r/g, obese, no palpable masses.  Musculoskeletal: No muscle atrophy/wasting. M/S 5/5 throughout, Extremities without ischemic changes. Feet are warm and pink.  Neurologic: A&O X 3; Appropriate Affect;  Speech is normal CN 2-12 intact, Pain and light touch intact in extremities, Motor exam as listed above.         Non-Invasive Vascular Imaging CAROTID DUPLEX (12/10/2015)   CEREBROVASCULAR DUPLEX EVALUATION    INDICATION: Carotid endarterectomy     PREVIOUS INTERVENTION(S): Bilateral carotid endarterectomies, right subclavian to distal common carotid artery bypass graft and right vertebral artery transposition on 05/10/08    DUPLEX EXAM:     RIGHT  LEFT  Peak Systolic Velocities (cm/s) End Diastolic Velocities (cm/s) Plaque LOCATION Peak Systolic Velocities (cm/s) End Diastolic Velocities (cm/s) Plaque  72(graft) 12  CCA PROXIMAL 122 28   68(graft) 11  CCA MID 125 22   60(graft) 8  CCA DISTAL 127 17   106 13  ECA 190 14   98 27  ICA PROXIMAL 72 24   95 26  ICA MID 83 25   97 25  ICA DISTAL 75 22     Not Calculated ICA / CCA Ratio (PSV) Not Calculated  Antegrade Vertebral Flow Bidirectional   Brachial Systolic Pressure (mmHg)   Multiphasic (subclavian artery) Brachial Artery Waveforms Monophasic (subclavian artery)    Plaque Morphology:  HM = Homogeneous, HT = Heterogeneous, CP = Calcific Plaque, SP = Smooth Plaque, IP = Irregular Plaque     ADDITIONAL FINDINGS: . No significant stenosis of the bilateral external or left common carotid arteries. . No flow noted in the right common carotid artery consistent with bypass graft. . Greater than 300cm/s velocities noted in the bilateral proximal subclavian arteries. . Mostly retrograde, bidirectional flow noted in the left vertebral artery, based on limited visualization. . Decreased visualization of the right subclavian artery origin and subclavian-common carotid artery bypass  graft proximal anastomosis due to depth and location. . Greater than 64mm/Mg difference noted in the bilateral brachial pressures.    IMPRESSION: 1. Patent bilateral carotid endarterectomy sites with no bilateral internal carotid artery stenoses. 2. Patent right subclavian to distal common carotid artery bypass graft noted. 3. Evidence of left subclavian artery occlusive disease noted, as described above. 4. Comparison to the previous exam is noted on the second page of this report.      Compared to the previous exam:  No significant change noted when compared to the previous exam on 11/14/2014.     11/19/15 ABI's requested by Dr. Burt Knack: Bilateral peroneal, anterior and posterior tibial artery waveforms are brisk and triphasic. Bilateral great toe PPG's are normal. Bilateral ABI's are stable and in the normal range. Normal great toe-brachial indices, bilaterally.    Assessment: Brenda [name] is a 55 y.o. female who is s/p bilateral carotid endarterectomies, right subclavian to distal common carotid artery bypass graft and right vertebral artery transposition on 05/10/08. She denies arm claudication symptoms, denies non healing wounds in extremities.   If she walks slowly she has no claudication in her calves, but if she walks fast she gets bilateral cramping in calves in about 3 minutes. Pt states she has arthritis in her sacrum.  Since her ABI's and TBI's are normal with all triphasic waveforms, the pain in her calves and right hip with walking may be related to lumbar spine stenosis. Pt states she has stents in both renal and iliac arteries, placed by Dr. Burt Knack per pt. Review of records: Dr. Burt Knack has been ordering and reviewing results of renal artery Duplex and ABI's.   The patient reports history of TIA symptoms before the 2009 CEA as manifested by slurred speech and dizziness, lasted a few seconds with no remaining neurological deficits. She has not had any subsequent strokes or  TIA's.  12/10/2015 carotid Duplex suggests patent bilateral carotid endarterectomy sites with no bilateral internal carotid artery stenoses, patent right subclavian to distal common carotid artery bypass graft, and evidence of left subclavian artery occlusive disease. No significant change noted when compared to the previous exam on 11/14/2014.  She is taking Brilinta prescribed by Dr. Burt Knack for her peripheral artery occlusive disease.  Her atherosclerotic risk factors include former smoker, CAD, and obesity. Fortunately she does not have DM.   Plan: Follow-up  in 1 year with Carotid Duplex scan.   I discussed in depth with the patient the nature of atherosclerosis, and emphasized the importance of maximal medical management including strict control of blood pressure, blood glucose, and lipid levels, obtaining regular exercise, and continued cessation of smoking.  The patient is aware that without maximal medical management the underlying atherosclerotic disease process will progress, limiting the benefit of any interventions. The patient was given information about stroke prevention and what symptoms should prompt the patient to seek immediate medical care. Thank you for allowing [place] to participate in this patient's care.  Clemon Chambers, RN, MSN, FNP-C [facility] Office: [facility] Physician: Bridgett Larsson on call  12/21/2015 4:34 PM

## 2015-12-21 NOTE — Patient Instructions (Signed)
Stroke Prevention Some medical conditions and behaviors are associated with an increased chance of having a stroke. You may prevent a stroke by making healthy choices and managing medical conditions. HOW CAN I REDUCE MY RISK OF HAVING A STROKE?   Stay physically active. Get at least 30 minutes of activity on most or all days.  Do not smoke. It may also be helpful to avoid exposure to secondhand smoke.  Limit alcohol use. Moderate alcohol use is considered to be:  No more than 2 drinks per day for men.  No more than 1 drink per day for nonpregnant women.  Eat healthy foods. This involves:  Eating 5 or more servings of fruits and vegetables a day.  Making dietary changes that address high blood pressure (hypertension), high cholesterol, diabetes, or obesity.  Manage your cholesterol levels.  Making food choices that are high in fiber and low in saturated fat, trans fat, and cholesterol may control cholesterol levels.  Take any prescribed medicines to control cholesterol as directed by your health care provider.  Manage your diabetes.  Controlling your carbohydrate and sugar intake is recommended to manage diabetes.  Take any prescribed medicines to control diabetes as directed by your health care provider.  Control your hypertension.  Making food choices that are low in salt (sodium), saturated fat, trans fat, and cholesterol is recommended to manage hypertension.  Ask your health care provider if you need treatment to lower your blood pressure. Take any prescribed medicines to control hypertension as directed by your health care provider.  If you are 18-39 years of age, have your blood pressure checked every 3-5 years. If you are 40 years of age or older, have your blood pressure checked every year.  Maintain a healthy weight.  Reducing calorie intake and making food choices that are low in sodium, saturated fat, trans fat, and cholesterol are recommended to manage  weight.  Stop drug abuse.  Avoid taking birth control pills.  Talk to your health care provider about the risks of taking birth control pills if you are over 35 years old, smoke, get migraines, or have ever had a blood clot.  Get evaluated for sleep disorders (sleep apnea).  Talk to your health care provider about getting a sleep evaluation if you snore a lot or have excessive sleepiness.  Take medicines only as directed by your health care provider.  For some people, aspirin or blood thinners (anticoagulants) are helpful in reducing the risk of forming abnormal blood clots that can lead to stroke. If you have the irregular heart rhythm of atrial fibrillation, you should be on a blood thinner unless there is a good reason you cannot take them.  Understand all your medicine instructions.  Make sure that other conditions (such as anemia or atherosclerosis) are addressed. SEEK IMMEDIATE MEDICAL CARE IF:   You have sudden weakness or numbness of the face, arm, or leg, especially on one side of the body.  Your face or eyelid droops to one side.  You have sudden confusion.  You have trouble speaking (aphasia) or understanding.  You have sudden trouble seeing in one or both eyes.  You have sudden trouble walking.  You have dizziness.  You have a loss of balance or coordination.  You have a sudden, severe headache with no known cause.  You have new chest pain or an irregular heartbeat. Any of these symptoms may represent a serious problem that is an emergency. Do not wait to see if the symptoms will   go away. Get medical help at once. Call your local emergency services (911 in [place] Do not drive yourself to the hospital.   This information is not intended to replace advice given to you by your health care provider. Make sure you discuss any questions you have with your health care provider.   Document Released: 07/03/2004 Document Revised: 06/16/2014 Document Reviewed:  11/26/2012 Elsevier Interactive Patient Education 2016 [employer]  

## 2015-12-31 ENCOUNTER — Other Ambulatory Visit: Payer: Self-pay | Admitting: Cardiovascular Disease

## 2016-01-03 ENCOUNTER — Other Ambulatory Visit: Payer: Self-pay | Admitting: Cardiovascular Disease

## 2016-01-13 ENCOUNTER — Other Ambulatory Visit: Payer: Self-pay | Admitting: Cardiovascular Disease

## 2016-02-01 ENCOUNTER — Other Ambulatory Visit: Payer: Self-pay | Admitting: Cardiovascular Disease

## 2016-02-08 ENCOUNTER — Encounter: Payer: Self-pay | Admitting: Cardiovascular Disease

## 2016-02-18 ENCOUNTER — Other Ambulatory Visit: Payer: Self-pay | Admitting: Cardiovascular Disease

## 2016-02-25 ENCOUNTER — Encounter: Payer: Self-pay | Admitting: Cardiovascular Disease

## 2016-02-25 ENCOUNTER — Ambulatory Visit (INDEPENDENT_AMBULATORY_CARE_PROVIDER_SITE_OTHER): Payer: Medicare Other | Admitting: Cardiovascular Disease

## 2016-02-25 VITALS — BP 130/80 | HR 78 | Ht 65.0 in | Wt 203.8 lb

## 2016-02-25 DIAGNOSIS — I739 Peripheral vascular disease, unspecified: Secondary | ICD-10-CM

## 2016-02-25 DIAGNOSIS — I10 Essential (primary) hypertension: Secondary | ICD-10-CM

## 2016-02-25 DIAGNOSIS — I701 Atherosclerosis of renal artery: Secondary | ICD-10-CM

## 2016-02-25 DIAGNOSIS — E785 Hyperlipidemia, unspecified: Secondary | ICD-10-CM

## 2016-02-25 MED ORDER — FUROSEMIDE 20 MG PO TABS: 20.0000 mg | ORAL_TABLET | Freq: Every day | ORAL | 1 refills | Status: DC | PRN

## 2016-02-25 NOTE — Patient Instructions (Addendum)
Medication Instructions:  Your physician has recommended you make the following change in your medication:  1. STOP Furosemide but you can use one tablet daily as needed for swelling 2. STOP Potassium   Labwork: Your physician recommends that you return for a FASTING LIPID and CMP--nothing to eat or drink after midnight, lab opens at 7:30 AM   Testing/Procedures: Your physician has requested that you have a renal artery duplex in 1 YEAR. During this test, an ultrasound is used to evaluate blood flow to the kidneys. Allow one hour for this exam. Do not eat after midnight the day before and avoid carbonated beverages. Take your medications as you usually do.  Your physician has requested that you have a lower extremity arterial duplex in 1 YEAR. This test is an ultrasound of the arteries in the legs. It looks at arterial blood flow in the legs. Allow one hour for Lower  Arterial scans. There are no restrictions or special instructions  Follow-Up: Your physician wants you to follow-up in: 1 YEAR with Dr Burt Knack.  You will receive a reminder letter in the mail two months in advance. If you don't receive a letter, please call our office to schedule the follow-up appointment.   Any Other Special Instructions Will Be Listed Below (If Applicable).     If you need a refill on your cardiac medications before your next appointment, please call your pharmacy.

## 2016-02-25 NOTE — Progress Notes (Signed)
Cardiology Office Note Date:  02/25/2016   ID:  Brenda [name], DOB 06/19/1960, MRN [id number]  PCP:  No PCP Per Patient  Cardiologist:  Sherren Mocha, MD    Chief Complaint  Patient presents with  . Coronary Artery Disease     History of Present Illness: Brenda [name] is a 55 y.o. female who presents for follow-up evaluation. She was last seen 1 year ago. The patient is followed for extensive peripheral arterial disease.  She's undergone carotid bypass, renal artery stenting, and bilateral iliac stenting. She has total occlusion of her superficial femoral arteries. She has significant stenotic disease of both subclavian and lower extremities, so it is always difficult to obtain a blood pressure. The patient is also followed regularly by vascular surgery. Her most recent note from [facility] by Vinnie Level Nickel is reviewed. Carotid ultrasound study is reviewed with stable findings.   The patient complains of bilateral calf pain with ambulation R > L. No chest pain or dyspnea. Has been compliant with medications and has no other complaints. Leg pain is stable by her report with no rest pain.    Past Medical History:  Diagnosis Date  . Anemia   . Arthritis   . CAD (coronary artery disease)   . Cardiomyopathy   . Carotid artery occlusion   . CHF (congestive heart failure) ([place]) 2007  . COPD (chronic obstructive pulmonary disease) ([facility])    not on home o2  . HTN (hypertension)   . Hypercholesteremia   . Pulmonary nodule   . PVD (peripheral vascular disease) ([place])   . Renal artery stenosis [facility])     Past Surgical History:  Procedure Laterality Date  . CARDIAC CATHETERIZATION    . CORONARY STENT PLACEMENT      Successful stenting of the right renal ostium   complicated by distal wire perforation.  The wire perforation was  treated with embolization of the branch vessels involved.  Will check CT   scan to assess the right renal parenchymal hematoma.  She will be   maintained on both  aspirin and Plavix.  Will place a Foley catheter to   avoid clotting since hematuria is likely.    Marland Kitchen EXTERNAL EAR SURGERY     as a child  . GALLBLADDER SURGERY      Current Outpatient Prescriptions  Medication Sig Dispense Refill  . acetaminophen (TYLENOL) 500 MG tablet Take 500 mg by mouth every 6 (six) hours as needed for mild pain.    Marland Kitchen albuterol (PROVENTIL HFA;VENTOLIN HFA) 108 (90 Base) MCG/ACT inhaler Inhale 1-2 puffs into the lungs every 6 (six) hours as needed for wheezing or shortness of breath. 1 Inhaler 1  . BRILINTA 60 MG TABS tablet TAKE ONE TABLET BY MOUTH TWICE DAILY 60 tablet 2  . budesonide-formoterol (SYMBICORT) 160-4.5 MCG/ACT inhaler Inhale 2 puffs into the lungs 2 (two) times daily. 1 Inhaler 12  . carvedilol (COREG) 6.25 MG tablet TAKE ONE TABLET BY MOUTH TWICE DAILY WITH MEALS 60 tablet 2  . furosemide (LASIX) 20 MG tablet TAKE ONE TABLET BY MOUTH EVERY OTHER DAY 15 tablet 0  . guaiFENesin (MUCINEX) 600 MG 12 hr tablet Take 2 tablets (1,200 mg total) by mouth 2 (two) times daily. 30 tablet 0  . KLOR-CON M20 20 MEQ tablet TAKE ONE TABLET BY MOUTH EVERY OTHER DAY 15 tablet 9  . losartan (COZAAR) 50 MG tablet TAKE ONE TABLET BY MOUTH ONCE DAILY 60 tablet 0  . simvastatin (ZOCOR) 40 MG tablet  TAKE ONE TABLET BY MOUTH ONCE DAILY 30 tablet 0  . tiotropium (SPIRIVA HANDIHALER) 18 MCG inhalation capsule Place 1 capsule (18 mcg total) into inhaler and inhale daily. 30 capsule 6   No current facility-administered medications for this visit.     Allergies:   Lisinopril and Oxycodone   Social History:  The patient  reports that she quit smoking about 10 years ago. Her smoking use included Cigarettes. She has a 31.00 pack-year smoking history. She has never used smokeless tobacco. She reports that she does not drink alcohol or use drugs.   Family History:  The patient's  family history includes Cancer in her father; Heart disease (age of onset: 15) in her mother; Hyperlipidemia  in her father and mother; Hypertension in her father and mother; Peripheral vascular disease in her mother.    ROS:  Please see the history of present illness.  All other systems are reviewed and negative.    PHYSICAL EXAM: VS:  BP 130/80   Pulse 78   Ht 5\' 5"  (1.651 m)   Wt 203 lb 12.8 oz (92.4 kg)   BMI 33.91 kg/m  , BMI Body mass index is 33.91 kg/m. GEN: Well nourished, well developed, in no acute distress  HEENT: normal  Neck: no JVD, no masses. Bilateral carotid bruits Cardiac: RRR with 2/6 SEM at the RUSB              Respiratory:  clear to auscultation bilaterally, normal work of breathing GI: soft, nontender, nondistended, + BS MS: no deformity or atrophy  Ext: no pretibial edema Skin: warm and dry, no rash Neuro:  Strength and sensation are intact Psych: euthymic mood, full affect  EKG:  EKG is ordered today. The ekg ordered today shows NSR 78 bpm, LBBB  Recent Labs: 09/07/2015: BUN 12; Creatinine, Ser 1.11; Hemoglobin 14.3; Platelets 222; Potassium 3.8; Sodium 138   Lipid Panel     Component Value Date/Time   CHOL 156 11/24/2013 1114   TRIG 191.0 (H) 11/24/2013 1114   HDL 36.50 (L) 11/24/2013 1114   CHOLHDL 4 11/24/2013 1114   VLDL 38.2 11/24/2013 1114   LDLCALC 81 11/24/2013 1114      Wt Readings from Last 3 Encounters:  02/25/16 203 lb 12.8 oz (92.4 kg)  12/21/15 202 lb (91.6 kg)  09/07/15 207 lb 6.4 oz (94.1 kg)     Cardiac Studies Reviewed: 2D Echo 10-31-2014: Study Conclusions  - Left ventricle: The cavity size was normal. There was moderate   concentric hypertrophy. Systolic function was vigorous. The   estimated ejection fraction was in the range of 65% to 70%. There   was no dynamic obstruction. Wall motion was normal; there were no   regional wall motion abnormalities. Doppler parameters are   consistent with abnormal left ventricular relaxation (grade 1   diastolic dysfunction).  Abdominal aortic/renal duplex  11-19-2015: Impressions Normal caliber abdominal aorta. >50% bilateral common and right external iliac artery stenosis, s/p PTA and stent placements.  Normal and essentially stable bilateral kidney size. 1-59% bilateral renal artery stenosis, s/p PTA and stent placements. Patent IVC and bilateral renal veins.  f/u 1 year.  ASSESSMENT AND PLAN: 1.  Lower extremity PAD with intermittent claudication. Extensive PAD of all extremities as outlined. ABI's are not accurate because of upper extremity occlusive disease. Pt with stable claudication. Most recent arterial studies reviewed. Bilateral iliac occlusive disease noted. Will continue current management unless progressive symptoms. Continue walking program and medical Rx.  2. Carotid  stenosis without hx of stroke: complex disease with hx of subclavian carotid bypass and continued patency at recent evaluation. Followed by [facility]  3. LBBB: no sx's of heart failure. LVEF preserved. Ok to stop lasix and KDur and only use furosemide prn.   4. Hyperlipidemia: treated with simvastatin. Update lipid panel. Reviewed lifestyle modification.   5. HTN: appears to be well-controlled. Update labs.   Current medicines are reviewed with the patient today.  The patient does not have concerns regarding medicines.  Labs/ tests ordered today include:  No orders of the defined types were placed in this encounter.   Disposition:   FU one year  Signed, Sherren Mocha, MD  02/25/2016 4:43 PM    [address], [place], [place]  [address] Phone: [phone]; Fax: [phone]

## 2016-03-04 ENCOUNTER — Other Ambulatory Visit: Payer: Medicare Other | Admitting: *Deleted

## 2016-03-04 DIAGNOSIS — E785 Hyperlipidemia, unspecified: Secondary | ICD-10-CM

## 2016-03-04 DIAGNOSIS — I10 Essential (primary) hypertension: Secondary | ICD-10-CM

## 2016-03-04 LAB — COMPREHENSIVE METABOLIC PANEL
ALBUMIN: 3.9 g/dL (ref 3.6–5.1)
ALK PHOS: 67 U/L (ref 33–130)
ALT: 11 U/L (ref 6–29)
AST: 14 U/L (ref 10–35)
BILIRUBIN TOTAL: 0.4 mg/dL (ref 0.2–1.2)
BUN: 7 mg/dL (ref 7–25)
CALCIUM: 9.1 mg/dL (ref 8.6–10.4)
CO2: 24 mmol/L (ref 20–31)
CREATININE: 0.92 mg/dL (ref 0.50–1.05)
Chloride: 107 mmol/L (ref 98–110)
Glucose, Bld: 91 mg/dL (ref 65–99)
Potassium: 3.9 mmol/L (ref 3.5–5.3)
SODIUM: 141 mmol/L (ref 135–146)
TOTAL PROTEIN: 6.6 g/dL (ref 6.1–8.1)

## 2016-03-04 LAB — LIPID PANEL
CHOLESTEROL: 143 mg/dL (ref 125–200)
HDL: 38 mg/dL — AB (ref >=46)
LDL Cholesterol: 73 mg/dL (ref <130)
TRIGLYCERIDES: 158 mg/dL — AB (ref <150)
Total CHOL/HDL Ratio: 3.8 Ratio (ref <=5.0)
VLDL: 32 mg/dL — ABNORMAL HIGH (ref <30)

## 2016-03-07 ENCOUNTER — Other Ambulatory Visit: Payer: Self-pay | Admitting: Cardiovascular Disease

## 2016-03-10 ENCOUNTER — Other Ambulatory Visit: Payer: Self-pay | Admitting: Cardiovascular Disease

## 2016-04-07 ENCOUNTER — Other Ambulatory Visit: Payer: Self-pay | Admitting: Cardiovascular Disease

## 2016-04-25 NOTE — Addendum Note (Signed)
Addended by: Lianne Cure A on: 04/25/2016 12:53 PM   Modules accepted: Orders

## 2016-04-30 ENCOUNTER — Encounter: Payer: Self-pay | Admitting: Gastroenterology

## 2016-05-08 ENCOUNTER — Other Ambulatory Visit: Payer: Self-pay | Admitting: Cardiovascular Disease

## 2016-06-09 ENCOUNTER — Other Ambulatory Visit: Payer: Self-pay | Admitting: Cardiovascular Disease

## 2016-06-12 NOTE — Telephone Encounter (Signed)
Barkley Boards, RN routed conversation to [employer], CMA; Cv Div Ch St Refill 37 minutes ago (8:30 AM)    Barkley Boards, RN 43 minutes ago (8:23 AM)    Reviewed chart and the last time this Rx was sent into the pharmacy by Dr Burt Knack was 10/27/2014. Per note on Rx from the pharmacy the last Rx was filled by Dr Silvestre Mesi on 09/24/15. Please deny Rx from Dr Burt Knack.  In review of chart this pt has mainly seen at Urgent Care for primary care issues. You can put in the comments for pt to contact PCP for further refills.    Documentation     Candice A Cox, CMA  Barkley Boards, RN 2 days ago    Ok to refill tiotropium (SPIRIVA HANDIHALER) 18 MCG inhalation capsule, Place 1 capsule (18 mcg total) into inhaler and inhale daily? If so how many refills? Or should this be sent to PCP? (Routing comment)

## 2016-06-12 NOTE — Telephone Encounter (Signed)
Reviewed chart and the last time this Rx was sent into the pharmacy by Dr Burt Knack was 10/27/2014. Per note on Rx from the pharmacy the last Rx was filled by Dr Silvestre Mesi on 09/24/15. Please deny Rx from Dr Burt Knack.  In review of chart this pt has mainly seen at Urgent Care for primary care issues. You can put in the comments for pt to contact PCP for further refills.

## 2016-12-12 ENCOUNTER — Encounter: Payer: Self-pay | Admitting: Family

## 2016-12-23 ENCOUNTER — Ambulatory Visit: Payer: Medicare Other | Admitting: Family

## 2016-12-23 ENCOUNTER — Encounter (HOSPITAL_COMMUNITY): Payer: Medicare Other

## 2016-12-29 ENCOUNTER — Encounter: Payer: Self-pay | Admitting: Family

## 2016-12-30 ENCOUNTER — Ambulatory Visit (HOSPITAL_COMMUNITY)
Admission: RE | Admit: 2016-12-30 | Discharge: 2016-12-30 | Disposition: A | Discharge status: Home / Self Care | Payer: Medicare Other | Source: Ambulatory Visit | Attending: Family | Admitting: Family

## 2016-12-30 ENCOUNTER — Ambulatory Visit (INDEPENDENT_AMBULATORY_CARE_PROVIDER_SITE_OTHER): Payer: Medicare Other | Admitting: Family

## 2016-12-30 ENCOUNTER — Encounter: Payer: Self-pay | Admitting: Family

## 2016-12-30 VITALS — BP 130/80 | HR 72 | Temp 97.6°F | Resp 18 | Ht 65.0 in | Wt 198.0 lb

## 2016-12-30 DIAGNOSIS — Z87891 Personal history of nicotine dependence: Secondary | ICD-10-CM | POA: Diagnosis not present

## 2016-12-30 DIAGNOSIS — I6523 Occlusion and stenosis of bilateral carotid arteries: Secondary | ICD-10-CM | POA: Diagnosis present

## 2016-12-30 DIAGNOSIS — I771 Stricture of artery: Secondary | ICD-10-CM | POA: Diagnosis not present

## 2016-12-30 DIAGNOSIS — Z9889 Other specified postprocedural states: Secondary | ICD-10-CM | POA: Insufficient documentation

## 2016-12-30 DIAGNOSIS — Z48812 Encounter for surgical aftercare following surgery on the circulatory system: Secondary | ICD-10-CM | POA: Diagnosis not present

## 2016-12-30 LAB — VAS US CAROTID
LCCADDIAS: 16 cm/s
LEFT ECA DIAS: -17 cm/s
LEFT VERTEBRAL DIAS: 0 cm/s
LICADDIAS: -32 cm/s
LICADSYS: -111 cm/s
LICAPSYS: -56 cm/s
Left CCA dist sys: 90 cm/s
Left CCA prox dias: 33 cm/s
Left CCA prox sys: 164 cm/s
Left ICA prox dias: -14 cm/s
RCCAPSYS: 209 cm/s
RIGHT ECA DIAS: -11 cm/s
RIGHT VERTEBRAL DIAS: -12 cm/s

## 2016-12-30 NOTE — Patient Instructions (Signed)
Stroke Prevention Some medical conditions and behaviors are associated with an increased chance of having a stroke. You may prevent a stroke by making healthy choices and managing medical conditions. How can I reduce my risk of having a stroke?  Stay physically active. Get at least 30 minutes of activity on most or all days.  Do not smoke. It may also be helpful to avoid exposure to secondhand smoke.  Limit alcohol use. Moderate alcohol use is considered to be: ? No more than 2 drinks per day for men. ? No more than 1 drink per day for nonpregnant women.  Eat healthy foods. This involves: ? Eating 5 or more servings of fruits and vegetables a day. ? Making dietary changes that address high blood pressure (hypertension), high cholesterol, diabetes, or obesity.  Manage your cholesterol levels. ? Making food choices that are high in fiber and low in saturated fat, trans fat, and cholesterol may control cholesterol levels. ? Take any prescribed medicines to control cholesterol as directed by your health care provider.  Manage your diabetes. ? Controlling your carbohydrate and sugar intake is recommended to manage diabetes. ? Take any prescribed medicines to control diabetes as directed by your health care provider.  Control your hypertension. ? Making food choices that are low in salt (sodium), saturated fat, trans fat, and cholesterol is recommended to manage hypertension. ? Ask your health care provider if you need treatment to lower your blood pressure. Take any prescribed medicines to control hypertension as directed by your health care provider. ? If you are 18-39 years of age, have your blood pressure checked every 3-5 years. If you are 40 years of age or older, have your blood pressure checked every year.  Maintain a healthy weight. ? Reducing calorie intake and making food choices that are low in sodium, saturated fat, trans fat, and cholesterol are recommended to manage  weight.  Stop drug abuse.  Avoid taking birth control pills. ? Talk to your health care provider about the risks of taking birth control pills if you are over 35 years old, smoke, get migraines, or have ever had a blood clot.  Get evaluated for sleep disorders (sleep apnea). ? Talk to your health care provider about getting a sleep evaluation if you snore a lot or have excessive sleepiness.  Take medicines only as directed by your health care provider. ? For some people, aspirin or blood thinners (anticoagulants) are helpful in reducing the risk of forming abnormal blood clots that can lead to stroke. If you have the irregular heart rhythm of atrial fibrillation, you should be on a blood thinner unless there is a good reason you cannot take them. ? Understand all your medicine instructions.  Make sure that other conditions (such as anemia or atherosclerosis) are addressed. Get help right away if:  You have sudden weakness or numbness of the face, arm, or leg, especially on one side of the body.  Your face or eyelid droops to one side.  You have sudden confusion.  You have trouble speaking (aphasia) or understanding.  You have sudden trouble seeing in one or both eyes.  You have sudden trouble walking.  You have dizziness.  You have a loss of balance or coordination.  You have a sudden, severe headache with no known cause.  You have new chest pain or an irregular heartbeat. Any of these symptoms may represent a serious problem that is an emergency. Do not wait to see if the symptoms will go away.   Get medical help at once. Call your local emergency services (911 in [place] Do not drive yourself to the hospital. This information is not intended to replace advice given to you by your health care provider. Make sure you discuss any questions you have with your health care provider. Document Released: 07/03/2004 Document Revised: 11/01/2015 Document Reviewed: 11/26/2012 Elsevier  Interactive Patient Education  2017 [employer] Inc.     Preventing Cerebrovascular Disease Arteries are blood vessels that carry blood that contains oxygen from the heart to all parts of the body. Cerebrovascular disease affects arteries that supply the brain. Any condition that blocks or disrupts blood flow to the brain can cause cerebrovascular disease. Brain cells that lose blood supply start to die within minutes (stroke). Stroke is the main danger of cerebrovascular disease. Atherosclerosis and high blood pressure are common causes of cerebrovascular disease. Atherosclerosis is narrowing and hardening of an artery that results when fat, cholesterol, calcium, or other substances (plaque) build up inside an artery. Plaque reduces blood flow through the artery. High blood pressure increases the risk of bleeding inside the brain. Making diet and lifestyle changes to prevent atherosclerosis and high blood pressure lowers your risk of cerebrovascular disease. What nutrition changes can be made?  Eat more fruits, vegetables, and whole grains.  Reduce how much saturated fat you eat. To do this, eat less red meat and fewer full-fat dairy products.  Eat healthy proteins instead of red meat. Healthy proteins include: ? Fish. Eat fish that contains heart-healthy omega-3 fatty acids, twice a week. Examples include salmon, albacore tuna, mackerel, and herring. ? Chicken. ? Nuts. ? Low-fat or nonfat yogurt.  Avoid processed meats, like bacon and lunchmeat.  Avoid foods that contain: ? A lot of sugar, such as sweets and drinks with added sugar. ? A lot of salt (sodium). Avoid adding extra salt to your food, as told by your health care provider. ? Trans fats, such as margarine and baked goods. Trans fats may be listed as "partially hydrogenated oils" on food labels.  Check food labels to see how much sodium, sugar, and trans fats are in foods.  Use vegetable oils that contain low amounts of  saturated fat, such as olive oil or canola oil. What lifestyle changes can be made?  Drink alcohol in moderation. This means no more than 1 drink a day for nonpregnant women and 2 drinks a day for men. One drink equals 12 oz of beer, 5 oz of wine, or 1 oz of hard liquor.  If you are overweight, ask your health care provider to recommend a weight-loss plan for you. Losing 5-10 lb (2.2-4.5 kg) can reduce your risk of diabetes, atherosclerosis, and high blood pressure.  Exercise for 30?60 minutes on most days, or as much as told by your health care provider. ? Do moderate-intensity exercise, such as brisk walking, bicycling, and water aerobics. Ask your health care provider which activities are safe for you.  Do not use any products that contain nicotine or tobacco, such as cigarettes and e-cigarettes. If you need help quitting, ask your health care provider. Why are these changes important? Making these changes lowers your risk of many diseases that can cause cerebrovascular disease and stroke. Stroke is a leading cause of death and disability. Making these changes also improves your overall health and quality of life. What can I do to lower my risk? The following factors make you more likely to develop cerebrovascular disease:  Being overweight.  Smoking.  Being physically inactive.    Eating a high-fat diet.  Having certain health conditions, such as: ? Diabetes. ? High blood pressure. ? Heart disease. ? Atherosclerosis. ? High cholesterol. ? Sickle cell disease.  Talk with your health care provider about your risk for cerebrovascular disease. Work with your health care provider to control diseases that you have that may contribute to cerebrovascular disease. Your health care provider may prescribe medicines to help prevent major causes of cerebrovascular disease. Where to find more information: Learn more about preventing cerebrovascular disease from:  National Heart, Lung, and  Blood Institute: www.nhlbi.nih.gov/health/health-topics/topics/stroke  Centers for Disease Control and Prevention: cdc.gov/stroke/about.htm  Summary  Cerebrovascular disease can lead to a stroke.  Atherosclerosis and high blood pressure are major causes of cerebrovascular disease.  Making diet and lifestyle changes can reduce your risk of cerebrovascular disease.  Work with your health care provider to get your risk factors under control to reduce your risk of cerebrovascular disease. This information is not intended to replace advice given to you by your health care provider. Make sure you discuss any questions you have with your health care provider. Document Released: 06/10/2015 Document Revised: 12/14/2015 Document Reviewed: 06/10/2015 Elsevier Interactive Patient Education  2018 [employer]vier Inc.  

## 2016-12-30 NOTE — Progress Notes (Signed)
Chief Complaint: Follow up Extracranial Carotid Artery Stenosis   History of Present Illness  Brenda [name] is a 56 y.o. female patient of Dr. Kellie Simmering who is s/p bilateral carotid endarterectomies by Dr. Drucie Opitz most recently in 2009. She also had a right carotid to subclavian bypass and re\re anastomosis of right vertebral artery. She denies arm claudication symptoms, denies non healing wounds in extremities.   If she walks slowly she has no claudication in her calves, but if she walks fast she gets bilateral cramping in calves in about 3 minutes. Pt states she has stents in both renal and iliac arteries, placed by Dr. Burt Knack per pt. Review of records: Dr. Burt Knack has been ordering and reviewing results of renal artery Duplex and ABI's.   The patient reports history of TIA symptoms before the 2009 CEA as manifested by slurred speech and dizziness, lasted a few seconds with no remaining neurological deficits.The patient denies a history of amaurosis fugax or monocular blindness, unilateralfacial drooping, or hemiplegia.  Pt Diabetic: no Pt smoker: former smoker, quit in 2007  Pt meds include: Statin : yes ASA: no Other anticoagulants/antiplatelets: Brilinta prescribed by Dr. Burt Knack since pt had renal artery stents placed, per pt; she is also taking this for PAOD    Past Medical History:  Diagnosis Date  . Anemia   . Arthritis   . CAD (coronary artery disease)   . Cardiomyopathy   . Carotid artery occlusion   . CHF (congestive heart failure) ([place]) 2007  . COPD (chronic obstructive pulmonary disease) ([facility])    not on home o2  . HTN (hypertension)   . Hypercholesteremia   . Pulmonary nodule   . PVD (peripheral vascular disease) ([place])   . Renal artery stenosis [facility])     Social History Social History  Substance Use Topics  . Smoking status: Former Smoker    Packs/day: 1.00    Years: 31.00    Types: Cigarettes    Quit date: 06/09/2005  . Smokeless tobacco: Never Used   . Alcohol use No    Family History Family History  Problem Relation Age of Onset  . Heart disease Mother 77       Heart Disease before age 55  . Hypertension Mother   . Hyperlipidemia Mother   . Peripheral vascular disease Mother   . Cancer Father   . Hyperlipidemia Father   . Hypertension Father   . Coronary artery disease Unknown     Surgical History Past Surgical History:  Procedure Laterality Date  . CARDIAC CATHETERIZATION    . CORONARY STENT PLACEMENT      Successful stenting of the right renal ostium   complicated by distal wire perforation.  The wire perforation was  treated with embolization of the branch vessels involved.  Will check CT   scan to assess the right renal parenchymal hematoma.  She will be   maintained on both aspirin and Plavix.  Will place a Foley catheter to   avoid clotting since hematuria is likely.    Marland Kitchen EXTERNAL EAR SURGERY     as a child  . GALLBLADDER SURGERY      Allergies  Allergen Reactions  . Lisinopril Cough    REACTION: D/C DUE TO COUGH/MC  . Oxycodone Nausea And Vomiting    Vomiting     Current Outpatient Prescriptions  Medication Sig Dispense Refill  . acetaminophen (TYLENOL) 500 MG tablet Take 500 mg by mouth every 6 (six) hours as needed for mild  pain.    . albuterol (PROVENTIL HFA;VENTOLIN HFA) 108 (90 Base) MCG/ACT inhaler Inhale 1-2 puffs into the lungs every 6 (six) hours as needed for wheezing or shortness of breath. 1 Inhaler 1  . BRILINTA 60 MG TABS tablet TAKE ONE TABLET BY MOUTH TWICE DAILY 60 tablet 11  . budesonide-formoterol (SYMBICORT) 160-4.5 MCG/ACT inhaler Inhale 2 puffs into the lungs 2 (two) times daily. 1 Inhaler 12  . carvedilol (COREG) 6.25 MG tablet TAKE ONE TABLET BY MOUTH TWICE DAILY WITH MEALS 60 tablet 11  . guaiFENesin (MUCINEX) 600 MG 12 hr tablet Take 2 tablets (1,200 mg total) by mouth 2 (two) times daily. 30 tablet 0  . losartan (COZAAR) 50 MG tablet TAKE ONE TABLET BY MOUTH ONCE DAILY 90 tablet 3   . simvastatin (ZOCOR) 40 MG tablet TAKE ONE TABLET BY MOUTH ONCE DAILY 90 tablet 3  . tiotropium (SPIRIVA HANDIHALER) 18 MCG inhalation capsule Place 1 capsule (18 mcg total) into inhaler and inhale daily. 30 capsule 6  . furosemide (LASIX) 20 MG tablet TAKE ONE TABLET BY MOUTH EVERY OTHER DAY (Patient not taking: Reported on 12/30/2016) 45 tablet 3   No current facility-administered medications for this visit.     Review of Systems : See HPI for pertinent positives and negatives.  Physical Examination  Vitals:   12/30/16 1359 12/30/16 1401  BP: (!) 155/81 130/80  Pulse: 72   Resp: 18   Temp: 97.6 F (36.4 C)   TempSrc: Oral   SpO2: 95%   Weight: 198 lb (89.8 kg)   Height: 5\' 5"  (1.651 m)    Body mass index is 32.95 kg/m.  General: WDWN obese female in NAD GAIT: normal Eyes: PERRLA Pulmonary: Respirations are non-labored, CTAB Cardiac: Regular rhythm and rate,no detected murmur.  VASCULAR EXAM Carotid Bruits Right Left   Positive positive   Aorta is not palpable. Radial pulses: right is 2+ palpable; left radial pulse is 2+ palpable, left brachial pulse is faintly palpable.      LE Pulses Right Left   FEMORAL not palpable not palpable    POPLITEAL not palpable  not palpable   POSTERIOR TIBIAL  2+palpable  2+ palpable    DORSALIS PEDIS  ANTERIOR TIBIAL  1+palpable   2+palpable     Gastrointestinal: soft, nontender, BS WNL, no r/g, obese, no palpable masses.  Musculoskeletal: No muscle atrophy/wasting. M/S 5/5 throughout, Extremities without ischemic changes. Feet are warm and pink.  Neurologic: A&O X 3; appropriate affect; speech is normal, CN 2-12 intact, Pain and light touch intact in extremities, Motor exam as listed above     Assessment: Brenda [name] is a 56 y.o. female  who is s/p bilateral carotid endarterectomies, right subclavian to distal common carotid artery bypass graft and right vertebral artery transposition on 05/10/08. She denies arm claudication symptoms, denies non healing wounds in extremities.   If she walks slowly she has no claudication in her calves, but if she walks fast she gets bilateral cramping in calves in about 3 minutes. Her pedal pulses are palpable bilaterally.  Pt states she has arthritis in her sacrum.  Since her ABI's and TBI's are normal with all triphasic waveforms, the pain in her calves and right hip with walking may be related to lumbar spine stenosis. Pt states she has stents in both renal and iliac arteries, placed by Dr. Burt Knack. Review of records: Dr. Burt Knack has been ordering and reviewing results of renal artery Duplex and ABI's.   The patient reports  history of TIA symptoms before the 2009 CEA as manifested by slurred speech and dizziness, lasted a few seconds with no residual neurological deficits. She has not had any subsequent strokes or TIA's.  She is taking Brilinta prescribed by Dr. Burt Knack for her peripheral artery occlusive disease.  Her atherosclerotic risk factors include former smoker, CAD, and obesity. Fortunately she does not have DM.   DATA  12/10/2015 Carotid Duplex  Patent bilateral carotid endarterectomy sites with <40% stenoses, patent right subclavian to distal common carotid artery bypass graft, and evidence of left subclavian artery occlusive disease.  Right vertebral artery flow is antegrade, left is not visible. Right subclavian artery waveforms are bi and triphasic, left are monophasic.  No significant change noted when compared to the previous exam on 12-10-15.   11/19/15 ABI's requested by Dr. Burt Knack: Bilateral peroneal, anterior and posterior tibial artery waveforms are brisk and triphasic. Bilateral great toe PPG's are normal. Bilateral ABI's are stable and in the normal range. Normal  great toe-brachial indices, bilaterally   Plan: Follow-up in 1 year with Carotid Duplex scan.     I discussed in depth with the patient the nature of atherosclerosis, and emphasized the importance of maximal medical management including strict control of blood pressure, blood glucose, and lipid levels, obtaining regular exercise, and continued cessation of smoking.  The patient is aware that without maximal medical management the underlying atherosclerotic disease process will progress, limiting the benefit of any interventions. The patient was given information about stroke prevention and what symptoms should prompt the patient to seek immediate medical care. Thank you for allowing [place] to participate in this patient's care.  Clemon Chambers, RN, MSN, FNP-C [facility] Office: [phone]  Clinic Physician: Early  12/30/16 3:34 PM

## 2017-02-24 ENCOUNTER — Encounter (HOSPITAL_COMMUNITY): Payer: Medicare Other

## 2017-03-02 ENCOUNTER — Ambulatory Visit (HOSPITAL_COMMUNITY)
Admission: RE | Admit: 2017-03-02 | Discharge: 2017-03-02 | Disposition: A | Discharge status: Home / Self Care | Payer: Medicare Other | Source: Ambulatory Visit | Attending: Internal Medicine | Admitting: Internal Medicine

## 2017-03-02 ENCOUNTER — Ambulatory Visit (HOSPITAL_COMMUNITY)
Admission: RE | Admit: 2017-03-02 | Discharge: 2017-03-02 | Disposition: A | Discharge status: Home / Self Care | Payer: Medicare Other | Source: Ambulatory Visit | Attending: Cardiovascular Disease | Admitting: Cardiovascular Disease

## 2017-03-02 DIAGNOSIS — Z95820 Peripheral vascular angioplasty status with implants and grafts: Secondary | ICD-10-CM | POA: Diagnosis not present

## 2017-03-02 DIAGNOSIS — I739 Peripheral vascular disease, unspecified: Secondary | ICD-10-CM

## 2017-03-02 DIAGNOSIS — I2510 Atherosclerotic heart disease of native coronary artery without angina pectoris: Secondary | ICD-10-CM | POA: Diagnosis not present

## 2017-03-02 DIAGNOSIS — I701 Atherosclerosis of renal artery: Secondary | ICD-10-CM

## 2017-03-02 DIAGNOSIS — J449 Chronic obstructive pulmonary disease, unspecified: Secondary | ICD-10-CM | POA: Insufficient documentation

## 2017-03-02 DIAGNOSIS — I10 Essential (primary) hypertension: Secondary | ICD-10-CM | POA: Insufficient documentation

## 2017-03-02 DIAGNOSIS — E785 Hyperlipidemia, unspecified: Secondary | ICD-10-CM | POA: Insufficient documentation

## 2017-03-04 ENCOUNTER — Telehealth: Payer: Self-pay

## 2017-03-04 DIAGNOSIS — I739 Peripheral vascular disease, unspecified: Secondary | ICD-10-CM

## 2017-03-04 NOTE — Telephone Encounter (Signed)
-----   Message from Sherren Mocha, MD sent at 03/04/2017 10:25 AM EDT ----- Stable findings. 1 yr FU recommended.

## 2017-03-04 NOTE — Telephone Encounter (Signed)
-----   Message from Sherren Mocha, MD sent at 03/04/2017 10:25 AM EDT ----- Stable findings - 1 year FU recommended.

## 2017-03-04 NOTE — Telephone Encounter (Signed)
Informed patient of results and verbal understanding expressed.  Repeat renal duplex and ABI ordered to be scheduled in 1 year. Patient agrees with treatment plan.

## 2017-03-06 ENCOUNTER — Encounter: Payer: Self-pay | Admitting: Cardiovascular Disease

## 2017-03-19 ENCOUNTER — Ambulatory Visit (INDEPENDENT_AMBULATORY_CARE_PROVIDER_SITE_OTHER): Payer: Medicare Other | Admitting: Cardiovascular Disease

## 2017-03-19 ENCOUNTER — Encounter: Payer: Self-pay | Admitting: Cardiovascular Disease

## 2017-03-19 ENCOUNTER — Ambulatory Visit: Payer: Medicare Other | Admitting: Cardiovascular Disease

## 2017-03-19 VITALS — BP 150/90 | HR 71 | Ht 65.0 in | Wt 196.8 lb

## 2017-03-19 DIAGNOSIS — I739 Peripheral vascular disease, unspecified: Secondary | ICD-10-CM | POA: Diagnosis not present

## 2017-03-19 DIAGNOSIS — I5032 Chronic diastolic (congestive) heart failure: Secondary | ICD-10-CM

## 2017-03-19 NOTE — Patient Instructions (Signed)
Medication Instructions:  Your provider recommends that you continue on your current medications as directed. Please refer to the Current Medication list given to you today.    Labwork: Your provider recommends that you return for FASTING lab work.  Testing/Procedures: None  Follow-Up: Your provider wants you to follow-up in: 1 year with Dr. Burt Knack. You will receive a reminder letter in the mail two months in advance. If you don't receive a letter, please call our office to schedule the follow-up appointment.    Any Other Special Instructions Will Be Listed Below (If Applicable).     If you need a refill on your cardiac medications before your next appointment, please call your pharmacy.

## 2017-03-19 NOTE — Progress Notes (Signed)
Cardiology Office Note Date:  03/20/2017   ID:  Brenda [name], DOB 1960-10-25, MRN [id number]  PCP:  Patient, No Pcp Per  Cardiologist:  Sherren Mocha, MD    Chief Complaint  Patient presents with  . Follow-up    1 year  . PAD   History of Present Illness: Brenda [name] is a 56 y.o. female who presents for follow-up of extensive peripheral arterial disease. She also has a history of chronic LBBB. She's undergone carotid bypass, renal artery stenting, and bilateral iliac stenting. She has total occlusion of her superficial femoral arteries. She has significant stenotic disease of both subclavian and lower extremities, so it is always difficult to obtain a blood pressure. The patient is also followed regularly by vascular surgery.  She is here alone today. Staying active watching her grandchildren. Denies any significant problems with leg pain or calf pain. No arm pain. No lightheadedness or syncope. She denies chest pain, shortness of breath, or leg swelling.    Past Medical History:  Diagnosis Date  . Anemia   . Arthritis   . CAD (coronary artery disease)   . Cardiomyopathy   . Carotid artery occlusion   . CHF (congestive heart failure) ([place]) 2007  . COPD (chronic obstructive pulmonary disease) ([facility])    not on home o2  . HTN (hypertension)   . Hypercholesteremia   . Pulmonary nodule   . PVD (peripheral vascular disease) ([place])   . Renal artery stenosis [facility])     Past Surgical History:  Procedure Laterality Date  . CARDIAC CATHETERIZATION    . CORONARY STENT PLACEMENT      Successful stenting of the right renal ostium   complicated by distal wire perforation.  The wire perforation was  treated with embolization of the branch vessels involved.  Will check CT   scan to assess the right renal parenchymal hematoma.  She will be   maintained on both aspirin and Plavix.  Will place a Foley catheter to   avoid clotting since hematuria is likely.    Marland Kitchen EXTERNAL EAR SURGERY     as a child  . GALLBLADDER SURGERY      Current Outpatient Prescriptions  Medication Sig Dispense Refill  . acetaminophen (TYLENOL) 500 MG tablet Take 500 mg by mouth every 6 (six) hours as needed for mild pain.    Marland Kitchen albuterol (PROVENTIL HFA;VENTOLIN HFA) 108 (90 Base) MCG/ACT inhaler Inhale 1-2 puffs into the lungs every 6 (six) hours as needed for wheezing or shortness of breath. 1 Inhaler 1  . BRILINTA 60 MG TABS tablet TAKE ONE TABLET BY MOUTH TWICE DAILY 60 tablet 11  . budesonide-formoterol (SYMBICORT) 160-4.5 MCG/ACT inhaler Inhale 2 puffs into the lungs 2 (two) times daily. 1 Inhaler 12  . carvedilol (COREG) 6.25 MG tablet TAKE ONE TABLET BY MOUTH TWICE DAILY WITH MEALS 60 tablet 11  . guaiFENesin (MUCINEX) 600 MG 12 hr tablet Take 2 tablets (1,200 mg total) by mouth 2 (two) times daily. 30 tablet 0  . losartan (COZAAR) 50 MG tablet TAKE ONE TABLET BY MOUTH ONCE DAILY 90 tablet 3  . simvastatin (ZOCOR) 40 MG tablet TAKE ONE TABLET BY MOUTH ONCE DAILY 90 tablet 3  . tiotropium (SPIRIVA HANDIHALER) 18 MCG inhalation capsule Place 1 capsule (18 mcg total) into inhaler and inhale daily. 30 capsule 6   No current facility-administered medications for this visit.     Allergies:   Lisinopril and Oxycodone   Social History:  The patient  reports that she quit smoking about 11 years ago. Her smoking use included Cigarettes. She has a 31.00 pack-year smoking history. She has never used smokeless tobacco. She reports that she does not drink alcohol or use drugs.   Family History:  The patient's family history includes Cancer in her father; Coronary artery disease in her unknown relative; Heart disease (age of onset: 27) in her mother; Hyperlipidemia in her father and mother; Hypertension in her father and mother; Peripheral vascular disease in her mother.    ROS:  Please see the history of present illness.  All other systems are reviewed and negative.    PHYSICAL EXAM: VS:  BP (!) 150/90    Pulse 71   Ht '5\' 5"'  (1.651 m)   Wt 196 lb 12.8 oz (89.3 kg)   BMI 32.75 kg/m  , BMI Body mass index is 32.75 kg/m. GEN: Well nourished, well developed, in no acute distress  HEENT: normal  Neck: no JVD, no masses. bilateral carotid bruits Cardiac: RRR with harsh 2/6 systolic murmur at the RUSB            Respiratory:  clear to auscultation bilaterally, normal work of breathing GI: soft, nontender, nondistended, + BS MS: no deformity or atrophy  Ext: no pretibial edema Skin: warm and dry, no rash Neuro:  Strength and sensation are intact Psych: euthymic mood, full affect  EKG:  EKG is ordered today. The ekg ordered today shows NSR 71 bpm, LBBB  Recent Labs: No results found for requested labs within last 8760 hours.   Lipid Panel     Component Value Date/Time   CHOL 143 03/04/2016 0959   TRIG 158 (H) 03/04/2016 0959   HDL 38 (L) 03/04/2016 0959   CHOLHDL 3.8 03/04/2016 0959   VLDL 32 (H) 03/04/2016 0959   LDLCALC 73 03/04/2016 0959      Wt Readings from Last 3 Encounters:  03/19/17 196 lb 12.8 oz (89.3 kg)  12/30/16 198 lb (89.8 kg)  02/25/16 203 lb 12.8 oz (92.4 kg)     Cardiac Studies Reviewed: Echo 10/31/2014: Study Conclusions  - Left ventricle: The cavity size was normal. There was moderate   concentric hypertrophy. Systolic function was vigorous. The   estimated ejection fraction was in the range of 65% to 70%. There   was no dynamic obstruction. Wall motion was normal; there were no   regional wall motion abnormalities. Doppler parameters are   consistent with abnormal left ventricular relaxation (grade 1   diastolic dysfunction).  Transthoracic echocardiography.  M-mode, complete 2D, spectral Doppler, and color Doppler.  Birthdate:  Patient birthdate: Apr 17, 1961.  Age:  Patient is 56 yr old.  Sex:  Gender: female. BMI: 35.3 kg/m^2.  Blood pressure:     105/63  Patient status: Inpatient.  Study date:  Study date: 10/31/2014. Study time: 09:46 AM.   Location:  Echo laboratory.  -------------------------------------------------------------------  ------------------------------------------------------------------- Left ventricle:  The cavity size was normal. There was moderate concentric hypertrophy. Systolic function was vigorous. The estimated ejection fraction was in the range of 65% to 70%. There was no dynamic obstruction. Wall motion was normal; there were no regional wall motion abnormalities. Doppler parameters are consistent with abnormal left ventricular relaxation (grade 1 diastolic dysfunction).  ------------------------------------------------------------------- Aortic valve:   Trileaflet; normal thickness, mildly calcified leaflets. Mobility was not restricted.  Doppler:  Transvalvular velocity was within the normal range. There was no stenosis. There was no regurgitation.  ------------------------------------------------------------------- Aorta:  Aortic root: The aortic root was  normal in size.  ------------------------------------------------------------------- Mitral valve:   Structurally normal valve.   Mobility was not restricted.  Doppler:  Transvalvular velocity was within the normal range. There was no evidence for stenosis. There was no regurgitation.    Peak gradient (D): 2 mm Hg.  ------------------------------------------------------------------- Left atrium:  The atrium was normal in size.  ------------------------------------------------------------------- Right ventricle:  The cavity size was at the upper limits of normal. Wall thickness was normal. Systolic function was normal.   ------------------------------------------------------------------- Pulmonic valve:   Poorly visualized.  Structurally normal valve. Cusp separation was normal.  Doppler:  Transvalvular velocity was within the normal range. There was no evidence for stenosis. There was no  regurgitation.  ------------------------------------------------------------------- Tricuspid valve:   Structurally normal valve.    Doppler: Transvalvular velocity was within the normal range. There was no regurgitation.  ------------------------------------------------------------------- Pulmonary artery:   The main pulmonary artery was normal-sized. Systolic pressure was within the normal range.  ------------------------------------------------------------------- Right atrium:  The atrium was normal in size.  ------------------------------------------------------------------- Pericardium:  There was no pericardial effusion.  ------------------------------------------------------------------- Systemic veins:  Poorly visualized.  ASSESSMENT AND PLAN: 1.  Lower extremity PAD with intermittent claudication: pt stable, currently without symptoms. Antiplatelet Rx with brilinta alone.   2. Carotid stenosis without hx of stroke: followed by [facility]. Recent notes reviewed. Appears stable.   3. LBBB: doing well without symptoms, LVEF has been normal by past echo assessment.   4. Hyperlipidemia: continue simvastatin. Update lipids. Lifestyle discussion regarding healthy eating, exercise.  5. HTN: will monitor home BP's. Understands to use right arm. States she's nervous about physician office visit and explains BP elevation related to that. Continue carvedilol and losartan. In past has experienced headaches when BP has been uncontrolled and no recent headache symptoms.   Current medicines are reviewed with the patient today.  The patient does not have concerns regarding medicines.  Labs/ tests ordered today include:   Orders Placed This Encounter  Procedures  . Comp Met (CMET)  . CBC with Differential/Platelet  . Lipid panel  . EKG 12-Lead   Disposition:   FU one year  Signed, Sherren Mocha, MD  03/20/2017 8:11 AM    [facility] [address],  [place], [place]  [address] Phone: [phone]; Fax: [phone]

## 2017-03-27 ENCOUNTER — Other Ambulatory Visit: Payer: Medicare Other | Admitting: *Deleted

## 2017-03-27 DIAGNOSIS — I739 Peripheral vascular disease, unspecified: Secondary | ICD-10-CM

## 2017-03-27 DIAGNOSIS — I5032 Chronic diastolic (congestive) heart failure: Secondary | ICD-10-CM

## 2017-03-27 LAB — CBC WITH DIFFERENTIAL/PLATELET
BASOS ABS: 0.1 10*3/uL (ref 0.0–0.2)
Basos: 1 %
EOS (ABSOLUTE): 0.2 10*3/uL (ref 0.0–0.4)
Eos: 2 %
Hematocrit: 44.1 % (ref 34.0–46.6)
Hemoglobin: 15.1 g/dL (ref 11.1–15.9)
IMMATURE GRANS (ABS): 0 10*3/uL (ref 0.0–0.1)
IMMATURE GRANULOCYTES: 0 %
LYMPHS: 23 %
Lymphocytes Absolute: 2.3 10*3/uL (ref 0.7–3.1)
MCH: 30.8 pg (ref 26.6–33.0)
MCHC: 34.2 g/dL (ref 31.5–35.7)
MCV: 90 fL (ref 79–97)
Monocytes Absolute: 0.7 10*3/uL (ref 0.1–0.9)
Monocytes: 7 %
NEUTROS PCT: 67 %
Neutrophils Absolute: 6.5 10*3/uL (ref 1.4–7.0)
PLATELETS: 346 10*3/uL (ref 150–379)
RBC: 4.9 x10E6/uL (ref 3.77–5.28)
RDW: 13 % (ref 12.3–15.4)
WBC: 9.9 10*3/uL (ref 3.4–10.8)

## 2017-03-27 LAB — COMPREHENSIVE METABOLIC PANEL
A/G RATIO: 1.5 (ref 1.2–2.2)
ALT: 15 IU/L (ref 0–32)
AST: 18 IU/L (ref 0–40)
Albumin: 4.4 g/dL (ref 3.5–5.5)
Alkaline Phosphatase: 94 IU/L (ref 39–117)
BILIRUBIN TOTAL: 0.7 mg/dL (ref 0.0–1.2)
BUN/Creatinine Ratio: 7 — ABNORMAL LOW (ref 9–23)
BUN: 6 mg/dL (ref 6–24)
CALCIUM: 9.7 mg/dL (ref 8.7–10.2)
CO2: 22 mmol/L (ref 20–29)
Chloride: 103 mmol/L (ref 96–106)
Creatinine, Ser: 0.9 mg/dL (ref 0.57–1.00)
GFR calc Af Amer: 83 mL/min/{1.73_m2} (ref >59)
GFR, EST NON AFRICAN AMERICAN: 72 mL/min/{1.73_m2} (ref >59)
GLUCOSE: 93 mg/dL (ref 65–99)
Globulin, Total: 2.9 g/dL (ref 1.5–4.5)
POTASSIUM: 4.2 mmol/L (ref 3.5–5.2)
Sodium: 142 mmol/L (ref 134–144)
Total Protein: 7.3 g/dL (ref 6.0–8.5)

## 2017-03-27 LAB — LIPID PANEL
CHOL/HDL RATIO: 3.3 ratio (ref 0.0–4.4)
CHOLESTEROL TOTAL: 135 mg/dL (ref 100–199)
HDL: 41 mg/dL (ref >39)
LDL CALC: 67 mg/dL (ref 0–99)
TRIGLYCERIDES: 134 mg/dL (ref 0–149)
VLDL CHOLESTEROL CAL: 27 mg/dL (ref 5–40)

## 2017-04-03 ENCOUNTER — Other Ambulatory Visit: Payer: Self-pay | Admitting: Cardiovascular Disease

## 2017-05-29 ENCOUNTER — Other Ambulatory Visit: Payer: Self-pay | Admitting: Cardiovascular Disease

## 2017-08-24 ENCOUNTER — Telehealth: Payer: Self-pay | Admitting: Cardiovascular Disease

## 2017-08-24 MED ORDER — CANDESARTAN CILEXETIL 16 MG PO TABS: 16.0000 mg | ORAL_TABLET | Freq: Every day | ORAL | 3 refills | Status: DC

## 2017-08-24 NOTE — Telephone Encounter (Signed)
New message     Pt c/o medication issue:  1. Name of Medication:  losartan (COZAAR) 50 MG tablet TAKE ONE TABLET BY MOUTH ONCE DAILY     2. How are you currently taking this medication (dosage and times per day)?  1 x a day   3. Are you having a reaction (difficulty breathing--STAT)? no  4. What is your medication issue? Received letter from the pharmacy about recall

## 2017-08-24 NOTE — Telephone Encounter (Signed)
Pt with CHF and history of ACEi intolerance. Would recommend change to candesartan 16mg  daily. Monitor pressures for 3-4 weeks after change if able and call with changes. Can be scheduled in HTN clinic if needed.

## 2017-08-24 NOTE — Telephone Encounter (Signed)
Instructed patient to STOP COZAAR and START CANDESARTAN 16 mg daily. She will monitor BP for the next few weeks and call with results. She was grateful for call and agrees with treatment plan.

## 2017-12-30 ENCOUNTER — Other Ambulatory Visit: Payer: Self-pay

## 2017-12-30 DIAGNOSIS — I6523 Occlusion and stenosis of bilateral carotid arteries: Secondary | ICD-10-CM

## 2017-12-30 DIAGNOSIS — I771 Stricture of artery: Secondary | ICD-10-CM

## 2018-01-12 ENCOUNTER — Encounter: Payer: Self-pay | Admitting: Gastroenterology

## 2018-01-26 ENCOUNTER — Ambulatory Visit: Payer: Medicare Other | Admitting: Family

## 2018-01-26 ENCOUNTER — Encounter (HOSPITAL_COMMUNITY): Payer: Medicare Other

## 2018-01-27 ENCOUNTER — Telehealth: Payer: Self-pay | Admitting: Cardiovascular Disease

## 2018-01-27 NOTE — Telephone Encounter (Signed)
Patient called in requesting Handicapped form to be completed. She stated she needs a new one for her daughters car to go out of town.

## 2018-01-28 NOTE — Telephone Encounter (Signed)
Brenda [name] states she would like another placard for her daughter's car - her temporary tag is expired. She states she will discuss with the doctor and get at her next visit.

## 2018-02-19 ENCOUNTER — Ambulatory Visit: Payer: Medicare Other | Admitting: Family

## 2018-02-19 ENCOUNTER — Encounter (HOSPITAL_COMMUNITY): Payer: Medicare Other

## 2018-03-05 ENCOUNTER — Ambulatory Visit (HOSPITAL_BASED_OUTPATIENT_CLINIC_OR_DEPARTMENT_OTHER)
Admission: RE | Admit: 2018-03-05 | Discharge: 2018-03-05 | Disposition: A | Discharge status: Home / Self Care | Payer: Medicare Other | Source: Ambulatory Visit | Attending: Cardiovascular Disease | Admitting: Cardiovascular Disease

## 2018-03-05 ENCOUNTER — Ambulatory Visit (HOSPITAL_COMMUNITY)
Admission: RE | Admit: 2018-03-05 | Discharge: 2018-03-05 | Disposition: A | Discharge status: Home / Self Care | Payer: Medicare Other | Source: Ambulatory Visit | Attending: Cardiology | Admitting: Cardiology

## 2018-03-05 DIAGNOSIS — I739 Peripheral vascular disease, unspecified: Secondary | ICD-10-CM | POA: Diagnosis present

## 2018-03-10 ENCOUNTER — Ambulatory Visit: Payer: Medicare Other | Admitting: Gastroenterology

## 2018-03-23 ENCOUNTER — Ambulatory Visit (HOSPITAL_COMMUNITY)
Admission: RE | Admit: 2018-03-23 | Discharge: 2018-03-23 | Disposition: A | Discharge status: Home / Self Care | Payer: Medicare Other | Source: Ambulatory Visit | Attending: Family | Admitting: Family

## 2018-03-23 ENCOUNTER — Ambulatory Visit: Payer: Medicare Other | Admitting: Family

## 2018-03-23 ENCOUNTER — Encounter: Payer: Self-pay | Admitting: Family

## 2018-03-23 VITALS — BP 177/81 | HR 70 | Temp 97.8°F | Resp 16 | Ht 65.0 in | Wt 186.0 lb

## 2018-03-23 DIAGNOSIS — I771 Stricture of artery: Secondary | ICD-10-CM | POA: Diagnosis not present

## 2018-03-23 DIAGNOSIS — I6523 Occlusion and stenosis of bilateral carotid arteries: Secondary | ICD-10-CM | POA: Diagnosis not present

## 2018-03-23 DIAGNOSIS — Z87891 Personal history of nicotine dependence: Secondary | ICD-10-CM | POA: Diagnosis not present

## 2018-03-23 DIAGNOSIS — Z9889 Other specified postprocedural states: Secondary | ICD-10-CM

## 2018-03-23 NOTE — Progress Notes (Signed)
Chief Complaint: Follow up Extracranial Carotid Artery Stenosis   History of Present Illness  Brenda [name] is a 57 y.o. female who is s/p bilateral carotid endarterectomies by Dr. Drucie Opitz most recently in 2009.  Dr. Kellie Simmering monitored pt after Dr. Amedeo Plenty departure. She also had a right carotid to subclavian bypass and re\re anastomosis of right vertebral artery. She denies arm claudication symptoms, denies non healing wounds in extremities.   If she walks slowly she has no claudication in her calves, but if she walks fast she gets bilateral cramping in calves in about 3 minutes. Pt states she has stents in both renal and iliac arteries, placed by Dr. Burt Knack per pt. Review of records: Dr. Burt Knack has been ordering and reviewing results of renal artery Duplex and ABI's.   The patient reports history of TIA symptoms before the 2009 CEA as manifested by slurred speech and dizziness, lasted a few seconds with no remaining neurological deficits.The patient denies a history of amaurosis fugax or monocular blindness, unilateralfacial drooping, or hemiplegia.  She denies tingling, numbness, pain, or cold sensation in either upper extremity.   She is seeing GI for what sounds like an esophogeal stricture: after she eats bread or flour crust, she vomits, no other food makes her vomit. She attributes her weight loss of 12 pounds in a year to not eating bread.  She denies post prandial abdominal pain.   It is noted that mesenteric and renal duplex requested by Dr. Burt Knack and performed on 03-05-18 shows an SMA proximal stenosis with a PSV of 496 cm/s. ABI's that day demonstrated mild disease bilaterally with biphasic waveforms. Her pedal pulses remain palpable.   Diabetic: no Tobacco use: former smoker, quit in 2007  Pt meds include: Statin : yes ASA: no Other anticoagulants/antiplatelets: Brilinta prescribed by Dr. Burt Knack since pt had renal artery stents placed, per pt; she is also taking this  for PAOD    Past Medical History:  Diagnosis Date  . Anemia   . Arthritis   . CAD (coronary artery disease)   . Cardiomyopathy   . Carotid artery occlusion   . CHF (congestive heart failure) ([place]) 2007  . COPD (chronic obstructive pulmonary disease) ([facility])    not on home o2  . HTN (hypertension)   . Hypercholesteremia   . Pulmonary nodule   . PVD (peripheral vascular disease) ([place])   . Renal artery stenosis [facility])     Social History Social History   Tobacco Use  . Smoking status: Former Smoker    Packs/day: 1.00    Years: 31.00    Pack years: 31.00    Types: Cigarettes    Last attempt to quit: 06/09/2005    Years since quitting: 12.7  . Smokeless tobacco: Never Used  Substance Use Topics  . Alcohol use: No    Alcohol/week: 0.0 standard drinks  . Drug use: No    Family History Family History  Problem Relation Age of Onset  . Heart disease Mother 14       Heart Disease before age 38  . Hypertension Mother   . Hyperlipidemia Mother   . Peripheral vascular disease Mother   . Cancer Father   . Hyperlipidemia Father   . Hypertension Father   . Coronary artery disease Unknown     Surgical History Past Surgical History:  Procedure Laterality Date  . CARDIAC CATHETERIZATION    . CORONARY STENT PLACEMENT      Successful stenting of the right renal ostium  complicated by distal wire perforation.  The wire perforation was  treated with embolization of the branch vessels involved.  Will check CT   scan to assess the right renal parenchymal hematoma.  She will be   maintained on both aspirin and Plavix.  Will place a Foley catheter to   avoid clotting since hematuria is likely.    Marland Kitchen EXTERNAL EAR SURGERY     as a child  . GALLBLADDER SURGERY      Allergies  Allergen Reactions  . Lisinopril Cough    REACTION: D/C DUE TO COUGH/[facility]  . Oxycodone Nausea And Vomiting    Vomiting     Current Outpatient Medications  Medication Sig Dispense Refill  . acetaminophen  (TYLENOL) 500 MG tablet Take 500 mg by mouth every 6 (six) hours as needed for mild pain.    Marland Kitchen albuterol (PROVENTIL HFA;VENTOLIN HFA) 108 (90 Base) MCG/ACT inhaler Inhale 1-2 puffs into the lungs every 6 (six) hours as needed for wheezing or shortness of breath. 1 Inhaler 1  . BRILINTA 60 MG TABS tablet TAKE ONE TABLET BY MOUTH TWICE DAILY 60 tablet 11  . budesonide-formoterol (SYMBICORT) 160-4.5 MCG/ACT inhaler Inhale 2 puffs into the lungs 2 (two) times daily. 1 Inhaler 12  . candesartan (ATACAND) 16 MG tablet Take 1 tablet (16 mg total) by mouth daily. 90 tablet 3  . carvedilol (COREG) 6.25 MG tablet TAKE ONE TABLET BY MOUTH TWICE DAILY WITH MEALS 60 tablet 11  . guaiFENesin (MUCINEX) 600 MG 12 hr tablet Take 2 tablets (1,200 mg total) by mouth 2 (two) times daily. 30 tablet 0  . simvastatin (ZOCOR) 40 MG tablet TAKE ONE TABLET BY MOUTH ONCE DAILY 90 tablet 3  . tiotropium (SPIRIVA HANDIHALER) 18 MCG inhalation capsule Place 1 capsule (18 mcg total) into inhaler and inhale daily. 30 capsule 6   No current facility-administered medications for this visit.     Review of Systems : See HPI for pertinent positives and negatives.  Physical Examination  Vitals:   03/23/18 1547 03/23/18 1548 03/23/18 1611 03/23/18 1613  BP: 139/77 (!) 174/81 (!) 195/84 (!) 177/81  Pulse: 70 72 69 70  Resp: 16     Temp: 97.8 F (36.6 C)     TempSrc: Oral     SpO2: 95% 95% 97% 95%  Weight: 186 lb (84.4 kg)     Height: 5\' 5"  (1.651 m)      Body mass index is 30.95 kg/m.  General: WDWN obese female in NAD GAIT: normal Eyes: PERRLA HENT: No gross abnormalities.  Pulmonary:  Respirations are non-labored, good air movement in all fields, CTAB, no rales, rhonchi, or wheezes. Cardiac: regular rhythm, + murmur.  VASCULAR EXAM Carotid Bruits Right Left   Positive, transmitted cardiac murmur Positive, transmitted cardiac murmur     Abdominal aortic pulse is not palpable. Radial pulses are 2+ palpable and  equal.  LE Pulses Right Left       POPLITEAL  not palpable   not palpable       POSTERIOR TIBIAL  2+ palpable   2+ palpable        DORSALIS PEDIS      ANTERIOR TIBIAL 2+ palpable  2+ palpable     Gastrointestinal: soft, nontender, BS WNL, no r/g, no palpable masses. Musculoskeletal: no muscle atrophy/wasting. M/S 5/5 throughout, extremities without ischemic changes. Skin: No rashes, no ulcers, no cellulitis.   Neurologic:  A&O X 3; appropriate affect, sensation is normal; speech is normal, CN 2-12 intact except seems to have some mild hearing loss, pain and light touch intact in extremities, motor exam as listed above. Psychiatric: Normal thought content, mood appropriate to clinical situation.     Assessment: Brenda [name] is a 57 y.o. female who is s/p bilateral carotid endarterectomies, right subclavian to distal common carotid artery bypass graft and right vertebral artery transposition on 05/10/08. She denies arm claudication symptoms, denies non healing wounds in extremities.  Her bilateral radial pulses are 2+ palpable.   She is seeing GI for what sounds like an esophogeal stricture: after she eats bread or flour crust, she vomits, no other food makes her vomit. She attributes her weight loss of 12 pounds in a year to not eating bread.  She denies post prandial abdominal pain.   It is noted that mesenteric and renal duplex requested by Dr. Burt Knack and performed on 03-05-18 shows an SMA proximal stenosis with a PSV of 496 cm/s. ABI's that day demonstrated mild disease bilaterally with biphasic waveforms. Her pedal pulses remain palpable.    Abdominal duplex on 03-05-18 also demonstrated: Right: Normal size right kidney. Normal right Resisitive Index.    1-59% stenosis of the right renal artery. RRV flow present.    Right common iliac velocity was 529  cm/s and the external    iliac velocity was 402 cm/s. The prior ABI was 0.99 and today    the ABI is 0.91. Left: Normal size of left kidney. Normal left Resistive Index.    1-59% stenosis of the left renal artery. LRV flow present.    Left common iliac artery velocity was 466 cm/s and the    external iliac velocity was 140 cm/s. The prior ABI was 0.93    and today the ABI was 0.83.  Pt states she has stents in both renal and iliac arteries, placed by Dr. Burt Knack. Review of records: Dr. Burt Knack has been ordering and reviewing results of renal artery Duplex and ABI's.   The patient reports history of TIA symptoms before the 2009 CEA as manifested by slurred speech and dizziness, lasted a few seconds with no residual neurological deficits. She has not had any subsequent strokes or TIA's.  She is taking Brilinta prescribed by Dr. Burt Knack for her peripheral artery occlusive disease.  Her atherosclerotic risk factors include former smoker, CAD, and obesity. Fortunately she does not have DM.  Asymptomatic elevated blood pressure: She does not check her blood pressure at home, but checks it at [facility]. I advised her to check her blood pressure there for a few days, keep a record of it, and show Dr. Burt Knack who prescribes her candesartan and carvedilol.    DATA  Carotid Duplex (03-23-18): Right Carotid: Velocities in the right ICA are consistent with a 1-39% stenosis.        Non-hemodynamically significant plaque <50% noted in the CCA. The  ECA appears <50% stenosed. Patent right subclavian artery to CCA        distal BPG. Patent right endarterectomy. Non-visualization of BPG        inflow and proximal anastamosis due to location. Elevated        velocities within the proximal BPG indicate 50-70% stenosis;        however no definate plaque visualized; no significant change from        prior  exam.  Left Carotid: Velocities in the left ICA are consistent with a 1-39% stenosis.       Hemodynamically significant plaque >50% visualized in the CCA. The       ECA appears <50% stenosed. Patent left endarterectomy. Elevated       velocities with plaque at the distal CCA just proximal to       endarterectomy site indicate a stenosis of >50%.  Vertebrals: Bilateral vertebral arteries demonstrate antegrade flow. Subclavians: Bilateral subclavian arteries were stenotic. Left subclavian artery       flow was disturbed.    Plan: Follow-up in 1year with Carotid Duplex scan.   I discussed in depth with the patient the nature of atherosclerosis, and emphasized the importance of maximal medical management including strict control of blood pressure, blood glucose, and lipid levels, obtaining regular exercise, and continued cessation of smoking.  The patient is aware that without maximal medical management the underlying atherosclerotic disease process will progress, limiting the benefit of any interventions. The patient was given information about stroke prevention and what symptoms should prompt the patient to seek immediate medical care. Thank you for allowing [place] to participate in this patient's care.  Clemon Chambers, RN, MSN, FNP-C [facility] Office: [phone]  Clinic Physician: Bishop Dublin  03/23/18 4:17 PM

## 2018-03-23 NOTE — Patient Instructions (Signed)

## 2018-05-17 ENCOUNTER — Other Ambulatory Visit: Payer: Self-pay | Admitting: Cardiovascular Disease

## 2018-06-04 ENCOUNTER — Encounter: Payer: Self-pay | Admitting: Cardiovascular Disease

## 2018-06-29 ENCOUNTER — Other Ambulatory Visit: Payer: Self-pay

## 2018-06-29 ENCOUNTER — Encounter (HOSPITAL_COMMUNITY): Payer: Self-pay

## 2018-06-29 ENCOUNTER — Emergency Department (HOSPITAL_COMMUNITY): Payer: Medicare Other

## 2018-06-29 ENCOUNTER — Emergency Department (HOSPITAL_COMMUNITY)
Admission: EM | Admit: 2018-06-29 | Discharge: 2018-06-29 | Disposition: A | Discharge status: Home / Self Care | Payer: Medicare Other | Attending: Emergency Medicine | Admitting: Emergency Medicine

## 2018-06-29 DIAGNOSIS — S0990XA Unspecified injury of head, initial encounter: Secondary | ICD-10-CM | POA: Insufficient documentation

## 2018-06-29 DIAGNOSIS — Z23 Encounter for immunization: Secondary | ICD-10-CM | POA: Insufficient documentation

## 2018-06-29 DIAGNOSIS — Y9301 Activity, walking, marching and hiking: Secondary | ICD-10-CM | POA: Insufficient documentation

## 2018-06-29 DIAGNOSIS — S61411A Laceration without foreign body of right hand, initial encounter: Secondary | ICD-10-CM

## 2018-06-29 DIAGNOSIS — Y999 Unspecified external cause status: Secondary | ICD-10-CM | POA: Insufficient documentation

## 2018-06-29 DIAGNOSIS — I5032 Chronic diastolic (congestive) heart failure: Secondary | ICD-10-CM | POA: Diagnosis not present

## 2018-06-29 DIAGNOSIS — W19XXXA Unspecified fall, initial encounter: Secondary | ICD-10-CM

## 2018-06-29 DIAGNOSIS — J449 Chronic obstructive pulmonary disease, unspecified: Secondary | ICD-10-CM | POA: Insufficient documentation

## 2018-06-29 DIAGNOSIS — I2510 Atherosclerotic heart disease of native coronary artery without angina pectoris: Secondary | ICD-10-CM | POA: Insufficient documentation

## 2018-06-29 DIAGNOSIS — W1842XA Slipping, tripping and stumbling without falling due to stepping into hole or opening, initial encounter: Secondary | ICD-10-CM | POA: Diagnosis not present

## 2018-06-29 DIAGNOSIS — S0081XA Abrasion of other part of head, initial encounter: Secondary | ICD-10-CM | POA: Insufficient documentation

## 2018-06-29 DIAGNOSIS — Z79899 Other long term (current) drug therapy: Secondary | ICD-10-CM | POA: Insufficient documentation

## 2018-06-29 DIAGNOSIS — I110 Hypertensive heart disease with heart failure: Secondary | ICD-10-CM | POA: Diagnosis not present

## 2018-06-29 DIAGNOSIS — T148XXA Other injury of unspecified body region, initial encounter: Secondary | ICD-10-CM

## 2018-06-29 DIAGNOSIS — Y929 Unspecified place or not applicable: Secondary | ICD-10-CM | POA: Insufficient documentation

## 2018-06-29 DIAGNOSIS — Z87891 Personal history of nicotine dependence: Secondary | ICD-10-CM | POA: Diagnosis not present

## 2018-06-29 MED ORDER — TETANUS-DIPHTH-ACELL PERTUSSIS 5-2.5-18.5 LF-MCG/0.5 IM SUSP
0.5000 mL | Freq: Once | INTRAMUSCULAR | Status: AC
  Administered 2018-06-29: 0.5 mL via INTRAMUSCULAR
  Filled 2018-06-29: qty 0.5

## 2018-06-29 MED ORDER — LIDOCAINE-EPINEPHRINE (PF) 2 %-1:200000 IJ SOLN
10.0000 mL | Freq: Once | INTRAMUSCULAR | Status: AC
  Administered 2018-06-29: 20 mL
  Filled 2018-06-29: qty 20

## 2018-06-29 NOTE — Discharge Instructions (Addendum)
Evaluated today after fall.  You have 3 sutures placed to right hand.  These need to be removed in 10 to 14 days.  Follow-up with primary care, urgent care or emergency department for reevaluation.  Please make sure to keep ice on these areas.  You may take Tylenol as needed for pain.

## 2018-06-29 NOTE — ED Provider Notes (Signed)
[place] DEPT Provider Note   CSN: [id number] Arrival date & time: 06/29/18  1747   History   Chief Complaint Chief Complaint  Patient presents with  . Laceration  . Fall    HPI Brenda [name] is a 58 y.o. female with past medical history significant for CHF, COPD, hypertension, PVD, arthritis who presents for evaluation after mechanical fall.  Patient states that she was walking earlier this evening and stepped into a pothole.  Patient states the front right portion of her face hit the ground as well as her right hand.  Patient states she has pain located just lateral to her right eye over the forehead as well as her right zygomatic process.  Patient has abrasion over this area.  Patient states she also has laceration located on the palmar surface of her fifth digit on her right upper extremity.  Patient unknown last tetanus shot.  Patient states she has mild facial pain which she rates a 4/10.  Has not taking anything PTA.  Denies headache, nausea, vomiting, vision changes, neck pain, neck stiffness, back pain, Chest pain, shortness of breath, lightheadedness, dizziness, abdominal pain, diarrhea or dysuria.  Denies decreased range of motion or numbness and tingling in her extremities.  She states she was previously on blood thinners, however is not sure she is still taking these.  History provided by patient and family member.  No interpreter was used.  HPI  Past Medical History:  Diagnosis Date  . Anemia   . Arthritis   . CAD (coronary artery disease)   . Cardiomyopathy   . Carotid artery occlusion   . CHF (congestive heart failure) ([place]) 2007  . COPD (chronic obstructive pulmonary disease) ([facility])    not on home o2  . HTN (hypertension)   . Hypercholesteremia   . Pulmonary nodule   . PVD (peripheral vascular disease) ([place])   . Renal artery stenosis [facility])     Patient Active Problem List   Diagnosis Date Noted  . Chronic diastolic CHF  (congestive heart failure) ([place]) 11/02/2014  . Acute respiratory failure with hypoxia ([place]) 11/01/2014  . COPD exacerbation ([place]) 10/31/2014  . Hyperglycemia 10/31/2014  . Polycythemia 10/31/2014  . Strep throat   . Research subject 10/10/2011  . ANEMIA, CHRONIC 11/03/2008  . HYPERTENSION 11/03/2008  . CAD 11/03/2008  . CAROTID ARTERY OCCLUSION 11/03/2008  . PULMONARY NODULE, SOLITARY 11/03/2008  . HYPERCHOLESTEROLEMIA 10/20/2008  . CARDIOMYOPATHY 10/20/2008  . COPD 10/20/2008  . RENAL ARTERY STENOSIS 10/09/2008  . Atherosclerosis of native arteries of the extremities with intermittent claudication 10/09/2008    Past Surgical History:  Procedure Laterality Date  . CARDIAC CATHETERIZATION    . CHOLECYSTECTOMY    . CORONARY STENT PLACEMENT      Successful stenting of the right renal ostium   complicated by distal wire perforation.  The wire perforation was  treated with embolization of the branch vessels involved.  Will check CT   scan to assess the right renal parenchymal hematoma.  She will be   maintained on both aspirin and Plavix.  Will place a Foley catheter to   avoid clotting since hematuria is likely.    Marland Kitchen EXTERNAL EAR SURGERY     as a child  . GALLBLADDER SURGERY       OB History   No obstetric history on file.      Home Medications    Prior to Admission medications   Medication Sig Start Date End Date Taking?  Authorizing Provider  acetaminophen (TYLENOL) 500 MG tablet Take 500 mg by mouth every 6 (six) hours as needed for mild pain.    [provider]  albuterol (PROVENTIL HFA;VENTOLIN HFA) 108 (90 Base) MCG/ACT inhaler Inhale 1-2 puffs into the lungs every 6 (six) hours as needed for wheezing or shortness of breath. 09/07/15   Nat Christen, MD  BRILINTA 60 MG TABS tablet TAKE ONE TABLET BY MOUTH TWICE DAILY 05/29/17   Sherren Mocha, MD  budesonide-formoterol [facility]) 160-4.5 MCG/ACT inhaler Inhale 2 puffs into the lungs 2 (two) times daily. 11/02/14    Verlee Monte, MD  candesartan (ATACAND) 16 MG tablet Take 1 tablet (16 mg total) by mouth daily. 08/24/17 08/19/18  Sherren Mocha, MD  carvedilol (COREG) 6.25 MG tablet Take 1 tablet (6.25 mg total) by mouth 2 (two) times daily with a meal. Please keep upcoming appt in January for future refills. Thank  You 05/18/18   Sherren Mocha, MD  guaiFENesin (MUCINEX) 600 MG 12 hr tablet Take 2 tablets (1,200 mg total) by mouth 2 (two) times daily. 11/02/14   Verlee Monte, MD  simvastatin (ZOCOR) 40 MG tablet Take 1 tablet (40 mg total) by mouth daily. Please keep upcoming appt in January for future refills. Thank you 05/18/18   Sherren Mocha, MD  tiotropium (SPIRIVA HANDIHALER) 18 MCG inhalation capsule Place 1 capsule (18 mcg total) into inhaler and inhale daily. 10/27/14   Sherren Mocha, MD    Family History Family History  Problem Relation Age of Onset  . Heart disease Mother 33       Heart Disease before age 61  . Hypertension Mother   . Hyperlipidemia Mother   . Peripheral vascular disease Mother   . Cancer Father   . Hyperlipidemia Father   . Hypertension Father   . Coronary artery disease Other     Social History Social History   Tobacco Use  . Smoking status: Former Smoker    Packs/day: 1.00    Years: 31.00    Pack years: 31.00    Types: Cigarettes    Last attempt to quit: 06/09/2005    Years since quitting: 13.0  . Smokeless tobacco: Never Used  Substance Use Topics  . Alcohol use: No    Alcohol/week: 0.0 standard drinks  . Drug use: No     Allergies   Lisinopril and Oxycodone   Review of Systems Review of Systems  Constitutional: Negative.   HENT: Positive for facial swelling. Negative for congestion, dental problem, drooling, ear discharge, ear pain, hearing loss, mouth sores, nosebleeds, postnasal drip, rhinorrhea, sinus pressure, sinus pain, sneezing, sore throat, tinnitus, trouble swallowing and voice change.   Eyes: Negative.   Respiratory: Negative.     Cardiovascular: Negative.   Gastrointestinal: Negative.   Genitourinary: Negative.   Musculoskeletal: Negative.   Skin: Positive for wound.  Neurological: Negative.   All other systems reviewed and are negative.    Physical Exam Updated Vital Signs BP (!) 147/98   Pulse 78   Temp 98.5 F (36.9 C) (Oral)   Resp 18   Ht 5\' 5"  (1.651 m)   Wt 84.4 kg   SpO2 99%   BMI 30.95 kg/m   Physical Exam Vitals signs and nursing note reviewed.  Constitutional:      General: She is not in acute distress.    Appearance: She is well-developed. She is not ill-appearing, toxic-appearing or diaphoretic.  HENT:     Head: Normocephalic. Abrasion and contusion present. No raccoon eyes,  Battle's sign, masses or laceration. Hair is normal.     Jaw: No trismus, tenderness, swelling, pain on movement or malocclusion.      Comments: Hematoma located over lateral right zygomatic process and right orbits    Nose: Nose normal. No nasal deformity, septal deviation, signs of injury, laceration, nasal tenderness, mucosal edema, congestion or rhinorrhea.     Right Nostril: No epistaxis or septal hematoma.     Left Nostril: No epistaxis or septal hematoma.     Right Sinus: No maxillary sinus tenderness or frontal sinus tenderness.     Left Sinus: No maxillary sinus tenderness or frontal sinus tenderness.     Mouth/Throat:     Lips: Pink.     Mouth: Mucous membranes are moist.     Pharynx: Oropharynx is clear. Uvula midline.  Eyes:     General: Lids are normal.     Extraocular Movements: Extraocular movements intact.     Conjunctiva/sclera: Conjunctivae normal.     Pupils: Pupils are equal, round, and reactive to light.  Neck:     Musculoskeletal: Full passive range of motion without pain and normal range of motion.     Comments: No neck stiffness or neck rigidity.  No midline cervical tenderness. Cardiovascular:     Rate and Rhythm: Normal rate.     Pulses: Normal pulses.     Heart sounds: Normal  heart sounds.  Pulmonary:     Effort: No respiratory distress.     Comments: Clear to auscultation bilaterally without wheeze, rhonchi or rales. Abdominal:     General: There is no distension.     Comments: Soft, nontender without rebound or guarding.  Musculoskeletal: Normal range of motion.     Right hip: Normal.     Left hip: Normal.     Cervical back: Normal.     Thoracic back: Normal.     Lumbar back: Normal.     Comments: Moves all extremities without difficulty.  No midline back pain.  No shortening or rotation of extremities.  Ambulatory without difficulty.  Skin:    General: Skin is warm and dry.     Comments: Lesions and abrasions to right portion of face.  1cm laceration to palmar surface of right upper extremity by fifth digit.  Neurological:     Mental Status: She is alert.          ED Treatments / Results  Labs (all labs ordered are listed, but only abnormal results are displayed) Labs Reviewed - No data to display  EKG None  Radiology Ct Head Wo Contrast  Result Date: 06/29/2018 CLINICAL DATA:  Fall.  Hit right-side of face EXAM: CT HEAD WITHOUT CONTRAST CT MAXILLOFACIAL WITHOUT CONTRAST TECHNIQUE: Multidetector CT imaging of the head and maxillofacial structures were performed using the standard protocol without intravenous contrast. Multiplanar CT image reconstructions of the maxillofacial structures were also generated. COMPARISON:  None. FINDINGS: CT HEAD FINDINGS Brain: No evidence of acute infarction, hemorrhage, hydrocephalus, extra-axial collection or mass lesion/mass effect. Vascular: No hyperdense vessel or unexpected calcification. Skull: Normal. Negative for fracture or focal lesion. Other: None CT MAXILLOFACIAL FINDINGS Osseous: No fracture or mandibular dislocation. No destructive process. Orbits: Negative. No traumatic or inflammatory finding. Sinuses: Clear. Soft tissues: There is mild soft tissue swelling involving the soft tissues of the right  side of face. IMPRESSION: 1. No acute intracranial abnormalities. 2. No evidence for facial bone fracture. 3. Mild soft tissue swelling involves the right side of face. Electronically Signed  By: Kerby Moors M.D.   On: 06/29/2018 19:24   Dg Hand Complete Right  Result Date: 06/29/2018 CLINICAL DATA:  Patient was walking and fell into a pothole. The patient has sustained right facial abrasion and hematoma as well as laceration of the right fifth MCP joint on the palmar aspect. EXAM: RIGHT HAND - COMPLETE 3+ VIEW COMPARISON:  None. FINDINGS: Bandaging overlies ulnar aspect of the right hand at the level of the fifth MCP joint. There is slight soft tissue swelling over the dorsum of the hand. No radiopaque foreign body, joint dislocation or fracture. Carpal rows are maintained. The distal radius and ulna appear intact. IMPRESSION: 1. Soft tissue swelling over the dorsum of the hand. No acute osseous abnormality. 2. No radiopaque foreign body. Electronically Signed   By: Ashley Royalty M.D.   On: 06/29/2018 19:24   Ct Maxillofacial Wo Contrast  Result Date: 06/29/2018 CLINICAL DATA:  Fall.  Hit right-side of face EXAM: CT HEAD WITHOUT CONTRAST CT MAXILLOFACIAL WITHOUT CONTRAST TECHNIQUE: Multidetector CT imaging of the head and maxillofacial structures were performed using the standard protocol without intravenous contrast. Multiplanar CT image reconstructions of the maxillofacial structures were also generated. COMPARISON:  None. FINDINGS: CT HEAD FINDINGS Brain: No evidence of acute infarction, hemorrhage, hydrocephalus, extra-axial collection or mass lesion/mass effect. Vascular: No hyperdense vessel or unexpected calcification. Skull: Normal. Negative for fracture or focal lesion. Other: None CT MAXILLOFACIAL FINDINGS Osseous: No fracture or mandibular dislocation. No destructive process. Orbits: Negative. No traumatic or inflammatory finding. Sinuses: Clear. Soft tissues: There is mild soft tissue swelling  involving the soft tissues of the right side of face. IMPRESSION: 1. No acute intracranial abnormalities. 2. No evidence for facial bone fracture. 3. Mild soft tissue swelling involves the right side of face. Electronically Signed   By: Kerby Moors M.D.   On: 06/29/2018 19:24    Procedures .Marland KitchenLaceration Repair Date/Time: 06/29/2018 8:11 PM Performed by: Nettie Elm, PA-C Authorized by: Nettie Elm, PA-C   Consent:    Consent obtained:  Verbal   Consent given by:  Patient   Risks discussed:  Infection, need for additional repair, pain, poor cosmetic result and poor wound healing   Alternatives discussed:  No treatment and delayed treatment Universal protocol:    Procedure explained and questions answered to patient or proxy's satisfaction: yes     Relevant documents present and verified: yes     Test results available and properly labeled: yes     Imaging studies available: yes     Required blood products, implants, devices, and special equipment available: yes     Site/side marked: yes     Immediately prior to procedure, a time out was called: yes     Patient identity confirmed:  Verbally with patient Anesthesia (see MAR for exact dosages):    Anesthesia method:  Local infiltration   Local anesthetic:  Lidocaine 1% w/o epi Laceration details:    Location:  Hand   Hand location:  R palm   Length (cm):  1 Repair type:    Repair type:  Simple Pre-procedure details:    Preparation:  Patient was prepped and draped in usual sterile fashion and imaging obtained to evaluate for foreign bodies Exploration:    Hemostasis achieved with:  Direct pressure   Wound exploration: wound explored through full range of motion and entire depth of wound probed and visualized     Contaminated: no   Treatment:    Area cleansed with:  Betadine   Amount of cleaning:  Standard   Irrigation solution:  Sterile saline   Irrigation method:  Syringe Skin repair:    Repair method:   Sutures   Suture size:  3-0   Suture material:  Prolene   Suture technique:  Simple interrupted   Number of sutures:  3 Approximation:    Approximation:  Close Post-procedure details:    Dressing:  Sterile dressing   Patient tolerance of procedure:  Tolerated well, no immediate complications   (including critical care time)  Medications Ordered in ED Medications  lidocaine-EPINEPHrine (XYLOCAINE W/EPI) 2 %-1:200000 (PF) injection 10 mL (20 mLs Infiltration Given 06/29/18 1927)  Tdap (BOOSTRIX) injection 0.5 mL (0.5 mLs Intramuscular Given 06/29/18 1926)     Initial Impression / Assessment and Plan / ED Course  I have reviewed the triage vital signs and the nursing notes.  Pertinent labs & imaging results that were available during my care of the patient were reviewed by me and considered in my medical decision making (see chart for details).  58 year old female who appears otherwise well presents for evaluation after mechanical fall.  Hematoma to right zygomatic process.  Tenderness palpation.  Extraocular movements intact.  No vision changes.  No facial crepitus or step-offs.  1 cm laceration to palmar surface of right upper extremity close to fifth digit.  Patient moves all extremities without difficulty.  No midline back or midline neck pain.  No episodes of nausea since incident.  No lightheaded, dizziness or headache.  Unsure if she is still currently on blood thinners.  Given questionable anticoagulation will obtain CT head, CT max face as well as x-rays hand and reevaluate.  We will plan on updating patient's tetanus as well as suturing her laceration.   CT head and CT max face negative.  Plain film negative for fracture, dislocation or retained foreign body to hand.  Laceration sutured with #3 Prolene sutures.  See procedure note.  Tetanus updated.  Hemodynamically stable and appropriate for DC home at this time.  Discussed wound care and timeline of suture removal.  Discussed return  precautions with patient and family.  Patient family voiced understanding are agreeable for follow-up.  Low suspicion for emergent pathology at this time requiring inpatient management at this time.  Stable for DC home   Final Clinical Impressions(s) / ED Diagnoses   Final diagnoses:  Fall, initial encounter  Hematoma  Laceration of right hand without foreign body, initial encounter    ED Discharge Orders    None       Elianna Windom A, PA-C 06/29/18 2017    Veryl Speak, MD 06/29/18 2257

## 2018-06-29 NOTE — ED Triage Notes (Signed)
Patient was walking and fell into a pothole. patient has a right facial hematoma and abrasion. patient has a laceration to the right 5th finger/right palm area.bleeding controlled. patient denies any N/v, headache, or dizziness.

## 2018-07-05 ENCOUNTER — Ambulatory Visit: Payer: Medicare Other | Admitting: Cardiovascular Disease

## 2018-07-05 ENCOUNTER — Encounter: Payer: Self-pay | Admitting: Cardiovascular Disease

## 2018-07-05 VITALS — BP 118/76 | HR 65 | Ht 65.0 in | Wt 183.8 lb

## 2018-07-05 DIAGNOSIS — I739 Peripheral vascular disease, unspecified: Secondary | ICD-10-CM

## 2018-07-05 DIAGNOSIS — I5032 Chronic diastolic (congestive) heart failure: Secondary | ICD-10-CM | POA: Diagnosis not present

## 2018-07-05 DIAGNOSIS — E782 Mixed hyperlipidemia: Secondary | ICD-10-CM | POA: Diagnosis not present

## 2018-07-05 DIAGNOSIS — I6523 Occlusion and stenosis of bilateral carotid arteries: Secondary | ICD-10-CM

## 2018-07-05 NOTE — Progress Notes (Signed)
Cardiology Office Note:    Date:  07/05/2018   ID:  Brenda [name], DOB 1960-08-31, MRN [id number]  PCP:  [facility]  Cardiologist:  Sherren Mocha, MD  Electrophysiologist:  None   Referring MD: No ref. provider found   Chief Complaint  Patient presents with  . PAD    History of Present Illness:    Brenda [name] is a 58 y.o. female with a hx of extensive peripheral arterial disease, presenting for follow-up evaluation.  She also has a history of chronic diastolic heart failure with an episode of pulmonary edema remotely.  The patient has a left bundle branch block pattern chronically on her EKG, but she has had preserved LV systolic function by echo assessment.  The patient is here alone today.  She complains of generalized fatigue.  She also has bilateral calf claudication that occurs with about 1 block, right worse than the left.  She has no rest pain.  She denies chest pain, shortness of breath, orthopnea, PND, or heart palpitations.  She is had no lightheadedness or syncope.  She did have a mechanical fall recently and sustained injuries to her right hand and the right side of her face.  Past Medical History:  Diagnosis Date  . Anemia   . Arthritis   . CAD (coronary artery disease)   . Cardiomyopathy   . Carotid artery occlusion   . CHF (congestive heart failure) ([place]) 2007  . COPD (chronic obstructive pulmonary disease) (HCC)    not on home o2  . HTN (hypertension)   . Hypercholesteremia   . Pulmonary nodule   . PVD (peripheral vascular disease) ([place])   . Renal artery stenosis [facility])     Past Surgical History:  Procedure Laterality Date  . CARDIAC CATHETERIZATION    . CHOLECYSTECTOMY    . CORONARY STENT PLACEMENT      Successful stenting of the right renal ostium   complicated by distal wire perforation.  The wire perforation was  treated with embolization of the branch vessels involved.  Will check CT   scan to assess the right  renal parenchymal hematoma.  She will be   maintained on both aspirin and Plavix.  Will place a Foley catheter to   avoid clotting since hematuria is likely.    Marland Kitchen EXTERNAL EAR SURGERY     as a child  . GALLBLADDER SURGERY      Current Medications: Current Meds  Medication Sig  . acetaminophen (TYLENOL) 500 MG tablet Take 500 mg by mouth every 6 (six) hours as needed for mild pain.  Marland Kitchen albuterol (PROVENTIL HFA;VENTOLIN HFA) 108 (90 Base) MCG/ACT inhaler Inhale 1-2 puffs into the lungs every 6 (six) hours as needed for wheezing or shortness of breath.  . BRILINTA 60 MG TABS tablet TAKE ONE TABLET BY MOUTH TWICE DAILY  . budesonide-formoterol (SYMBICORT) 160-4.5 MCG/ACT inhaler Inhale 2 puffs into the lungs 2 (two) times daily.  . candesartan (ATACAND) 16 MG tablet Take 1 tablet (16 mg total) by mouth daily.  . carvedilol (COREG) 6.25 MG tablet Take 1 tablet (6.25 mg total) by mouth 2 (two) times daily with a meal. Please keep upcoming appt in January for future refills. Thank  You  . guaiFENesin (MUCINEX) 600 MG 12 hr tablet Take 600 mg by mouth 2 (two) times daily as needed.  . simvastatin (ZOCOR) 40 MG tablet Take 1 tablet (40 mg total) by mouth daily. Please keep upcoming appt in January for  future refills. Thank you  . tiotropium (SPIRIVA) 18 MCG inhalation capsule Place 18 mcg into inhaler and inhale as needed.     Allergies:   Lisinopril and Oxycodone   Social History   Socioeconomic History  . Marital status: Married    Spouse name: Not on file  . Number of children: Not on file  . Years of education: Not on file  . Highest education level: Not on file  Occupational History  . Not on file  Social Needs  . Financial resource strain: Not on file  . Food insecurity:    Worry: Not on file    Inability: Not on file  . Transportation needs:    Medical: Not on file    Non-medical: Not on file  Tobacco Use  . Smoking status: Former Smoker    Packs/day: 1.00    Years: 31.00     Pack years: 31.00    Types: Cigarettes    Last attempt to quit: 06/09/2005    Years since quitting: 13.0  . Smokeless tobacco: Never Used  Substance and Sexual Activity  . Alcohol use: No    Alcohol/week: 0.0 standard drinks  . Drug use: No  . Sexual activity: Not Currently  Lifestyle  . Physical activity:    Days per week: Not on file    Minutes per session: Not on file  . Stress: Not on file  Relationships  . Social connections:    Talks on phone: Not on file    Gets together: Not on file    Attends religious service: Not on file    Active member of club or organization: Not on file    Attends meetings of clubs or organizations: Not on file    Relationship status: Not on file  Other Topics Concern  . Not on file  Social History Narrative  . Not on file     Family History: The patient's family history includes Cancer in her father; Coronary artery disease in an other family member; Heart disease (age of onset: 36) in her mother; Hyperlipidemia in her father and mother; Hypertension in her father and mother; Peripheral vascular disease in her mother.  ROS:   Please see the history of present illness.    All other systems reviewed and are negative.  EKGs/Labs/Other Studies Reviewed:    The following studies were reviewed today: Echocardiogram 10/31/2014: Left ventricle:  The cavity size was normal. There was moderate concentric hypertrophy. Systolic function was vigorous. The estimated ejection fraction was in the range of 65% to 70%. There was no dynamic obstruction. Wall motion was normal; there were no regional wall motion abnormalities. Doppler parameters are consistent with abnormal left ventricular relaxation (grade 1 diastolic dysfunction).  ------------------------------------------------------------------- Aortic valve:   Trileaflet; normal thickness, mildly calcified leaflets. Mobility was not restricted.  Doppler:  Transvalvular velocity was within the  normal range. There was no stenosis. There was no regurgitation.  ------------------------------------------------------------------- Aorta:  Aortic root: The aortic root was normal in size.  ------------------------------------------------------------------- Mitral valve:   Structurally normal valve.   Mobility was not restricted.  Doppler:  Transvalvular velocity was within the normal range. There was no evidence for stenosis. There was no regurgitation.    Peak gradient (D): 2 mm Hg.  ------------------------------------------------------------------- Left atrium:  The atrium was normal in size.  ------------------------------------------------------------------- Right ventricle:  The cavity size was at the upper limits of normal. Wall thickness was normal. Systolic function was normal.   ------------------------------------------------------------------- Pulmonic valve:  Poorly visualized.  Structurally normal valve. Cusp separation was normal.  Doppler:  Transvalvular velocity was within the normal range. There was no evidence for stenosis. There was no regurgitation.  ------------------------------------------------------------------- Tricuspid valve:   Structurally normal valve.    Doppler: Transvalvular velocity was within the normal range. There was no regurgitation.  ------------------------------------------------------------------- Pulmonary artery:   The main pulmonary artery was normal-sized. Systolic pressure was within the normal range.  ------------------------------------------------------------------- Right atrium:  The atrium was normal in size.  ------------------------------------------------------------------- Pericardium:  There was no pericardial effusion.  ------------------------------------------------------------------- Systemic veins:  Poorly visualized.  Renal arterial duplex 03/05/2018: FINAL INTERPRETATION: Renal:   Right:  Normal size right kidney. Normal right Resisitive Index.        1-59% stenosis of the right renal artery. RRV flow present.        Right common iliac velocity was 529 cm/s and the external        iliac velocity was 402 cm/s. The prior ABI was 0.99 and today        the ABI is 0.91. Left:  Normal size of left kidney. Normal left Resistive Index.        1-59% stenosis of the left renal artery. LRV flow present.        Left common iliac artery velocity was 466 cm/s and the        external iliac velocity was 140 cm/s. The prior ABI was 0.93        and today the ABI was 0.83. Mesenteric: 70 to 99% stenosis in the superior mesenteric artery.  ABI's 03/05/2018: ABI Findings: +---------+------------------+-----+--------+--------+ Right    Rt Pressure (mmHg)IndexWaveformComment  +---------+------------------+-----+--------+--------+ Brachial 173                                     +---------+------------------+-----+--------+--------+ ATA      151               0.87 biphasic         +---------+------------------+-----+--------+--------+ PTA      157               0.91 biphasic         +---------+------------------+-----+--------+--------+ PERO     152               0.88 biphasic         +---------+------------------+-----+--------+--------+ Great Toe148               0.86 Normal           +---------+------------------+-----+--------+--------+  +---------+------------------+-----+----------+-------------------------------+ Left     Lt Pressure (mmHg)IndexWaveform  Comment                         +---------+------------------+-----+----------+-------------------------------+ Brachial 136                    monophasicKnown subclavian artery disease +---------+------------------+-----+----------+-------------------------------+ ATA      128               0.74 biphasic                                   +---------+------------------+-----+----------+-------------------------------+ PTA      144               0.83 biphasic                                  +---------+------------------+-----+----------+-------------------------------+  PERO     139               0.80 biphasic                                  +---------+------------------+-----+----------+-------------------------------+ Great Toe128               0.74 Normal                                    +---------+------------------+-----+----------+-------------------------------+  +-------+-----------+-----------+------------+------------+ ABI/TBIToday's ABIToday's TBIPrevious ABIPrevious TBI +-------+-----------+-----------+------------+------------+ Right  0.91       0.86       0.99        0.82         +-------+-----------+-----------+------------+------------+ Left   0.83       0.74       0.93        0.76         +-------+-----------+-----------+------------+------------+   S/p right carotid to subclavian bypass and re\re anastomosis of right vertebral artery, and known left subclavian artery occlusive disease.   Right ABIs appear essentially unchanged compared to prior study on 02/2017. Left ABIs appear decreased compared to prior study on 02/2017.   Final Interpretation: Right: Resting right ankle-brachial index indicates mild right lower extremity arterial disease. The right toe-brachial index is normal.  Left: Resting left ankle-brachial index indicates mild left lower extremity arterial disease. The left toe-brachial index is normal.  Carotid 03/23/2018: Summary: Right Carotid: Velocities in the right ICA are consistent with a 1-39% stenosis.                Non-hemodynamically significant plaque <50% noted in the CCA. The                ECA appears <50% stenosed. Patent right subclavian artery to CCA                distal BPG. Patent right endarterectomy. Non-visualization of BPG                 inflow and proximal anastamosis due to location. Elevated                velocities within the proximal BPG indicate 50-70% stenosis;                however no definate plaque visualized; no significant change from                prior exam.  Left Carotid: Velocities in the left ICA are consistent with a 1-39% stenosis.               Hemodynamically significant plaque >50% visualized in the CCA. The               ECA appears <50% stenosed. Patent left endarterectomy. Elevated               velocities with plaque at the distal CCA just proximal to               endarterectomy site indicate a stenosis of >50%.  EKG:  EKG is ordered today.  The ekg ordered today demonstrates NSR 65 bpm, LBBB  Recent Labs: No results found for requested labs within last 8760 hours.  Recent Lipid Panel    Component Value Date/Time   CHOL 135 03/27/2017 0934  TRIG 134 03/27/2017 0934   HDL 41 03/27/2017 0934   CHOLHDL 3.3 03/27/2017 0934   CHOLHDL 3.8 03/04/2016 0959   VLDL 32 (H) 03/04/2016 0959   LDLCALC 67 03/27/2017 0934    Physical Exam:    VS:  BP 118/76   Pulse 65   Ht 5\' 5"  (1.651 m)   Wt 183 lb 12.8 oz (83.4 kg)   SpO2 95%   BMI 30.59 kg/m     Wt Readings from Last 3 Encounters:  07/05/18 183 lb 12.8 oz (83.4 kg)  06/29/18 186 lb (84.4 kg)  03/23/18 186 lb (84.4 kg)    GEN: Well nourished, well developed in no acute distress HEENT: Normal except for right facial ecchymosis NECK: No JVD; BL carotid bruits LYMPHATICS: No lymphadenopathy CARDIAC: RRR, 2/6 harsh systolic murmur at the RUSB and LUSB RESPIRATORY:  Clear to auscultation without rales, wheezing or rhonchi  ABDOMEN: Soft, non-tender, non-distended MUSCULOSKELETAL:  No edema; No deformity.  Right radial pulse 2+, left radial pulse 1+, pedal pulses nonpalpable. SKIN: Warm and dry NEUROLOGIC:  Alert and oriented x 3 PSYCHIATRIC:  Normal affect   ASSESSMENT:    1. PAD (peripheral artery disease) ([place])    2. Bilateral extracranial carotid artery stenosis   3. Mixed hyperlipidemia   4. Chronic diastolic CHF (congestive heart failure) (HCC)    PLAN:    In order of problems listed above:  1. The patient has stable symptoms of intermittent claudication, but her iliac velocities on serial duplex ultrasound have continued to increase.  The right common iliac artery has a peak velocity of 5.3 m/s and the left common iliac has a peak velocity of 4.7 m/s.  ABIs are not accurate in this patient because of severe occlusive disease in all 4 extremities.  I am going to refer her for PV evaluation to see if angiography and/or PTA/stenting is indicated.  She will continue on her medical program as outlined above. 2. The patient's carotid stenosis appears stable based on most recent carotid duplex scan.  She has had no stroke or TIA symptoms recently. 3. Treated with a statin drug, lipids at goal 4. The patient appears euvolemic with New York Heart Association functional class I symptoms.  Her most recent echocardiogram is reviewed.   Medication Adjustments/Labs and Tests Ordered: Current medicines are reviewed at length with the patient today.  Concerns regarding medicines are outlined above.  Orders Placed This Encounter  Procedures  . Ambulatory referral to Cardiology  . EKG 12-Lead   No orders of the defined types were placed in this encounter.   Patient Instructions  Medication Instructions:  Your provider recommends that you continue on your current medications as directed. Please refer to the Current Medication list given to you today.    Labwork: None  Testing/Procedures: None  Follow-Up: You have been referred to Dr. Fletcher Anon. You are scheduled Tuesday, July 27, 2018 at 10:40AM.   Your provider wants you to follow-up in: 1 year with Dr. Burt Knack. You will receive a reminder letter in the mail two months in advance. If you don't receive a letter, please call our office to schedule the  follow-up appointment.      Signed, Sherren Mocha, MD  07/05/2018 5:09 PM    [place]

## 2018-07-05 NOTE — Patient Instructions (Addendum)
Medication Instructions:  Your provider recommends that you continue on your current medications as directed. Please refer to the Current Medication list given to you today.    Labwork: None  Testing/Procedures: None  Follow-Up: You have been referred to Dr. Fletcher Anon. You are scheduled Tuesday, July 27, 2018 at 10:40AM.   Your provider wants you to follow-up in: 1 year with Dr. Burt Knack. You will receive a reminder letter in the mail two months in advance. If you don't receive a letter, please call our office to schedule the follow-up appointment.

## 2018-07-27 ENCOUNTER — Ambulatory Visit: Payer: Medicare Other | Admitting: Cardiovascular Disease

## 2018-07-27 ENCOUNTER — Other Ambulatory Visit: Payer: Self-pay | Admitting: Cardiovascular Disease

## 2018-07-27 ENCOUNTER — Encounter: Payer: Self-pay | Admitting: Cardiovascular Disease

## 2018-07-27 VITALS — BP 158/80 | HR 72 | Ht 65.0 in | Wt 181.6 lb

## 2018-07-27 DIAGNOSIS — I10 Essential (primary) hypertension: Secondary | ICD-10-CM | POA: Diagnosis not present

## 2018-07-27 DIAGNOSIS — E785 Hyperlipidemia, unspecified: Secondary | ICD-10-CM | POA: Diagnosis not present

## 2018-07-27 DIAGNOSIS — I779 Disorder of arteries and arterioles, unspecified: Secondary | ICD-10-CM

## 2018-07-27 DIAGNOSIS — I739 Peripheral vascular disease, unspecified: Secondary | ICD-10-CM

## 2018-07-27 NOTE — Patient Instructions (Addendum)
Medication Instructions:  No changes If you need a refill on your cardiac medications before your next appointment, please call your pharmacy.   Lab work: None ordered  Testing/Procedures: None ordered  Follow-Up: At [facility], you and your health needs are our priority.  As part of our continuing mission to provide you with exceptional heart care, we have created designated Provider Care Teams.  These Care Teams include your primary Cardiologist (physician) and Advanced Practice Providers (APPs -  Physician Assistants and Nurse Practitioners) who all work together to provide you with the care you need, when you need it. You will need a follow up appointment in 3 months.You may see Dr. Fletcher Anon or one of the following Advanced Practice Providers on your designated Care Team:   Kerin Ransom, PA-C Roby Lofts, [place] . Sande Rives, PA-C

## 2018-07-27 NOTE — Progress Notes (Signed)
Cardiology Office Note   Date:  07/27/2018   ID:  Brenda [name], DOB [birth year], MRN [id number]  [facility]P:  [facility]  Cardiologist:  Dr. Burt Knack  No chief complaint on file.     History of Present Illness: Brenda [name] is a 58 y.o. female who was referred by Dr. Burt Knack for evaluation and management of peripheral arterial disease. She has multiple medical problems including coronary artery disease, chronic systolic heart failure with subsequent improvement in ejection fraction, COPD, previous tobacco use, hypertension, hyperlipidemia and extensive peripheral arterial disease. She had previous right renal artery stenting in 6269 which was complicated by wire induced perforation that was treated with coils.  She had in-stent restenosis that was subsequently treated with covered stent placement. She also known to have history of iliac artery disease that has been managed conservatively given that her symptoms were not lifestyle limiting.  She also has known history of carotid disease status post bilateral carotid endarterectomy and right carotid to subclavian artery bypass.  She reports prolonged symptoms of bilateral calf discomfort with walking.  This happens after walking about 1 block and forces her to stop.  She reports that she is not very active overall.  She denies any chest pain or shortness of breath.  She underwent renal artery duplex in September that showed patent right renal artery stent.  She was noted to have progression of iliac disease with velocity of 529 in the right common iliac and 466 in the left common iliac.  ABI was borderline reduced.  Past Medical History:  Diagnosis Date  . Anemia   . Arthritis   . CAD (coronary artery disease)   . Cardiomyopathy   . Carotid artery occlusion   . CHF (congestive heart failure) ([place]) 2007  . COPD (chronic obstructive pulmonary disease) (HCC)    not on home o2  . HTN (hypertension)   .  Hypercholesteremia   . Pulmonary nodule   . PVD (peripheral vascular disease) ([place])   . Renal artery stenosis [facility])     Past Surgical History:  Procedure Laterality Date  . CARDIAC CATHETERIZATION    . CHOLECYSTECTOMY    . CORONARY STENT PLACEMENT      Successful stenting of the right renal ostium   complicated by distal wire perforation.  The wire perforation was  treated with embolization of the branch vessels involved.  Will check CT   scan to assess the right renal parenchymal hematoma.  She will be   maintained on both aspirin and Plavix.  Will place a Foley catheter to   avoid clotting since hematuria is likely.    Marland Kitchen EXTERNAL EAR SURGERY     as a child  . GALLBLADDER SURGERY       Current Outpatient Medications  Medication Sig Dispense Refill  . acetaminophen (TYLENOL) 500 MG tablet Take 500 mg by mouth every 6 (six) hours as needed for mild pain.    Marland Kitchen albuterol (PROVENTIL HFA;VENTOLIN HFA) 108 (90 Base) MCG/ACT inhaler Inhale 1-2 puffs into the lungs every 6 (six) hours as needed for wheezing or shortness of breath. 1 Inhaler 1  . BRILINTA 60 MG TABS tablet TAKE 1 TABLET BY MOUTH TWICE DAILY 60 tablet 12  . budesonide-formoterol (SYMBICORT) 160-4.5 MCG/ACT inhaler Inhale 2 puffs into the lungs 2 (two) times daily. 1 Inhaler 12  . candesartan (ATACAND) 16 MG tablet Take 1 tablet (16 mg total) by mouth daily. 90 tablet 3  . carvedilol (  COREG) 6.25 MG tablet Take 1 tablet (6.25 mg total) by mouth 2 (two) times daily with a meal. Please keep upcoming appt in January for future refills. Thank  You 180 tablet 0  . guaiFENesin (MUCINEX) 600 MG 12 hr tablet Take 600 mg by mouth 2 (two) times daily as needed.    . simvastatin (ZOCOR) 40 MG tablet Take 1 tablet (40 mg total) by mouth daily. Please keep upcoming appt in January for future refills. Thank you 90 tablet 0  . tiotropium (SPIRIVA) 18 MCG inhalation capsule Place 18 mcg into inhaler and inhale as needed.     No current  facility-administered medications for this visit.     Allergies:   Lisinopril and Oxycodone    Social History:  The patient  reports that she quit smoking about 13 years ago. Her smoking use included cigarettes. She has a 31.00 pack-year smoking history. She has never used smokeless tobacco. She reports that she does not drink alcohol or use drugs.   Family History:  The patient's family history includes Cancer in her father; Coronary artery disease in an other family member; Heart disease (age of onset: 49) in her mother; Hyperlipidemia in her father and mother; Hypertension in her father and mother; Peripheral vascular disease in her mother.    ROS:  Please see the history of present illness.   Otherwise, review of systems are positive for none.   All other systems are reviewed and negative.    PHYSICAL EXAM: VS:  BP (!) 158/80   Pulse 72   Ht 5\' 5"  (1.651 m)   Wt 181 lb 9.6 oz (82.4 kg)   BMI 30.22 kg/m  , BMI Body mass index is 30.22 kg/m. GEN: Well nourished, well developed, in no acute distress  HEENT: normal  Neck: no JVD or masses.  Right carotid bruit Cardiac: RRR; no rubs, or gallops,no edema .  2 out of 6 systolic murmur in the aortic area Respiratory:  clear to auscultation bilaterally, normal work of breathing GI: soft, nontender, nondistended, + BS MS: no deformity or atrophy  Skin: warm and dry, no rash Neuro:  Strength and sensation are intact Psych: euthymic mood, full affect Vascular: Femoral pulse is mildly diminished bilaterally.  Dorsalis pedis is palpable but diminished on both sides.   EKG:  EKG is not ordered today.   Recent Labs: No results found for requested labs within last 8760 hours.    Lipid Panel    Component Value Date/Time   CHOL 135 03/27/2017 0934   TRIG 134 03/27/2017 0934   HDL 41 03/27/2017 0934   CHOLHDL 3.3 03/27/2017 0934   CHOLHDL 3.8 03/04/2016 0959   VLDL 32 (H) 03/04/2016 0959   LDLCALC 67 03/27/2017 0934      Wt  Readings from Last 3 Encounters:  07/27/18 181 lb 9.6 oz (82.4 kg)  07/05/18 183 lb 12.8 oz (83.4 kg)  06/29/18 186 lb (84.4 kg)      No flowsheet data found.    ASSESSMENT AND PLAN:  1.  Peripheral arterial disease with severe bilateral leg claudication due to iliac artery disease (Rutherford class III): The patient reports that she had the symptoms for a long time and it appears that there has been gradual progression.  Although her claudication happens after relatively short distance, she reports that she does not exert herself enough to be significantly limited by this.  I discussed management options including angiography and endovascular intervention versus continued monitoring.  I did  explain to her that given degree of stenosis, there is the possibility of progression to complete occlusion which would make endovascular intervention more difficult. She wants to continue monitoring for now and think about it before considering proceeding with further invasive procedures.  2.  Carotid artery disease: Followed by [facility].  3.  Hyperlipidemia: Currently on simvastatin 40 mg daily.  Most recent LDL was 67.  4.  Essential hypertension: Blood pressures is mildly elevated today.    Disposition:   FU with me in 3 months  Signed,  Kathlyn Sacramento, MD  07/27/2018 10:45 AM    [place]

## 2018-08-04 ENCOUNTER — Encounter: Payer: Self-pay | Admitting: Gastroenterology

## 2018-08-23 ENCOUNTER — Other Ambulatory Visit: Payer: Self-pay | Admitting: Cardiovascular Disease

## 2018-09-01 ENCOUNTER — Telehealth (INDEPENDENT_AMBULATORY_CARE_PROVIDER_SITE_OTHER): Payer: Medicare Other | Admitting: Gastroenterology

## 2018-09-01 ENCOUNTER — Other Ambulatory Visit: Payer: Self-pay

## 2018-09-01 DIAGNOSIS — R1310 Dysphagia, unspecified: Secondary | ICD-10-CM | POA: Diagnosis not present

## 2018-09-01 MED ORDER — OMEPRAZOLE 40 MG PO CPDR: 40.0000 mg | DELAYED_RELEASE_CAPSULE | Freq: Every day | ORAL | 3 refills | Status: AC

## 2018-09-01 NOTE — Patient Instructions (Signed)
We will call in omeprazole 40 mg, 1 pill once daily shortly before her breakfast or lunch meal every day.  Dispense 30 with 3 refills.  This should be called into [facility] on [address].   New GI office visit with me, face-to-face in 2 months.

## 2018-09-01 NOTE — Progress Notes (Signed)
This service was provided via telemedicine.  The patient was located at home.  I was located in my office.  The patient did consent to this telephone visit and is aware of possible charges through their insurance for this visit.  She was referred by her primary care physician through the [facility] system.  Nobody else participated in this telemedicine service however my certified medical assistant  helped with follow-up recommendations.  Time spent on call: 18 min   HPI: This is a very pleasant 58 year old woman whom I actually saw about 12 years ago for a colonoscopy.  She was referred by her primary care physician Tobie Lords through [facility] to discuss dysphasia   I did a colonoscopy for her January 2008 for hematochezia.  The examination was normal.  I recommended a repeat colonoscopy at 10-year interval for routine risk colon cancer screening   She has had dysphagia for about a year.  Bread, some meats; then 10-15 minutes later she has hiccups after she eats.  She has no pyrosis.  Never has.  Sometimes regurgitates, with mucous as well.  She has been losing weight. 30 pounds in the past 2 years.  She weighs 183 pounds now, she is 5 foot five.  She has never never tried anything for the swallowing issues.  Eating cereal with milk, soft foods, soup all go down fine.  She had an appointment with a GI doc 5-6 months ago but missed.   ROS: complete GI ROS as described in HPI, all other review negative.  Constitutional:  No unintentional weight loss   Past Medical History:  Diagnosis Date  . Anemia   . Arthritis   . CAD (coronary artery disease)   . Cardiomyopathy   . Carotid artery occlusion   . CHF (congestive heart failure) ([place]) 2007  . COPD (chronic obstructive pulmonary disease) (H[facility])    not on home o2  . HTN (hypertension)   . Hypercholesteremia   . Pulmonary nodule   . PVD (peripheral vascular disease) ([place])   . Renal artery stenosis [facility])     Past  Surgical History:  Procedure Laterality Date  . CARDIAC CATHETERIZATION    . CHOLECYSTECTOMY    . CORONARY STENT PLACEMENT      Successful stenting of the right renal ostium   complicated by distal wire perforation.  The wire perforation was  treated with embolization of the branch vessels involved.  Will check CT   scan to assess the right renal parenchymal hematoma.  She will be   maintained on both aspirin and Plavix.  Will place a Foley catheter to   avoid clotting since hematuria is likely.    Marland Kitchen EXTERNAL EAR SURGERY     as a child  . GALLBLADDER SURGERY      Current Outpatient Medications  Medication Sig Dispense Refill  . acetaminophen (TYLENOL) 500 MG tablet Take 500 mg by mouth every 6 (six) hours as needed for mild pain.    Marland Kitchen albuterol (PROVENTIL HFA;VENTOLIN HFA) 108 (90 Base) MCG/ACT inhaler Inhale 1-2 puffs into the lungs every 6 (six) hours as needed for wheezing or shortness of breath. 1 Inhaler 1  . BRILINTA 60 MG TABS tablet TAKE 1 TABLET BY MOUTH TWICE DAILY 60 tablet 12  . budesonide-formoterol (SYMBICORT) 160-4.5 MCG/ACT inhaler Inhale 2 puffs into the lungs 2 (two) times daily. 1 Inhaler 12  . candesartan (ATACAND) 16 MG tablet Take 1 tablet (16 mg total) by mouth daily. 90 tablet 3  .  carvedilol (COREG) 6.25 MG tablet Take 1 tablet (6.25 mg total) by mouth 2 (two) times daily with a meal. Please keep upcoming appt in January for future refills. Thank  You 180 tablet 0  . guaiFENesin (MUCINEX) 600 MG 12 hr tablet Take 600 mg by mouth 2 (two) times daily as needed.    . simvastatin (ZOCOR) 40 MG tablet TAKE 1 TABLET BY MOUTH ONCE DAILY .  PLEASE  KEEP  UPCOMING  APPOINTMENT  IN  JANUARY  FOR  FURTHER  REFILLS. 90 tablet 2  . tiotropium (SPIRIVA) 18 MCG inhalation capsule Place 18 mcg into inhaler and inhale as needed.     No current facility-administered medications for this visit.     Allergies as of 09/01/2018 - Review Complete 07/27/2018  Allergen Reaction Noted  .  Lisinopril Cough 10/19/2007  . Oxycodone Nausea And Vomiting 10/01/2010    Family History  Problem Relation Age of Onset  . Heart disease Mother 44       Heart Disease before age 32  . Hypertension Mother   . Hyperlipidemia Mother   . Peripheral vascular disease Mother   . Cancer Father   . Hyperlipidemia Father   . Hypertension Father   . Coronary artery disease Other     Social History   Socioeconomic History  . Marital status: Married    Spouse name: Not on file  . Number of children: Not on file  . Years of education: Not on file  . Highest education level: Not on file  Occupational History  . Not on file  Social Needs  . Financial resource strain: Not on file  . Food insecurity:    Worry: Not on file    Inability: Not on file  . Transportation needs:    Medical: Not on file    Non-medical: Not on file  Tobacco Use  . Smoking status: Former Smoker    Packs/day: 1.00    Years: 31.00    Pack years: 31.00    Types: Cigarettes    Last attempt to quit: 06/09/2005    Years since quitting: 13.2  . Smokeless tobacco: Never Used  Substance and Sexual Activity  . Alcohol use: No    Alcohol/week: 0.0 standard drinks  . Drug use: No  . Sexual activity: Not Currently  Lifestyle  . Physical activity:    Days per week: Not on file    Minutes per session: Not on file  . Stress: Not on file  Relationships  . Social connections:    Talks on phone: Not on file    Gets together: Not on file    Attends religious service: Not on file    Active member of club or organization: Not on file    Attends meetings of clubs or organizations: Not on file    Relationship status: Not on file  . Intimate partner violence:    Fear of current or ex partner: Not on file    Emotionally abused: Not on file    Physically abused: Not on file    Forced sexual activity: Not on file  Other Topics Concern  . Not on file  Social History Narrative  . Not on file     Physical Exam: Unable  to perform because this was a "telemed visit" due to current Covid-19 pandemic  Assessment and plan: 58 y.o. female with chronic dysphasia, weight loss over 2 years  She describes some dysphasia as well as some hiccups after eating.  She has lost about 30 pounds but it has been over 2 years.  Overall my suspicion of significant neoplasia, cancer is low.  Under ordinary circumstances I would probably proceed with an EGD in the next few weeks however with worldwide coronavirus pandemic I think it is probably safe that we wait for now.  Instead I am calling her in a proton pump inhibitor 1 pill once daily and I would like to meet her here in the office face-to-face to see how she responds to that and get a better idea of how she looks in about 2 months time.  Please see the "Patient Instructions" section for addition details about the plan.  Owens Loffler, MD [facility] 09/01/2018, 2:18 PM

## 2018-09-08 ENCOUNTER — Telehealth: Payer: Self-pay | Admitting: Gastroenterology

## 2018-09-08 NOTE — Telephone Encounter (Signed)
[facility] pt.refferal coordinator is wanting offc visit notes from virtual appt faxed to [phone]

## 2018-09-08 NOTE — Telephone Encounter (Signed)
Notes faxed per request 

## 2018-09-25 ENCOUNTER — Other Ambulatory Visit: Payer: Self-pay | Admitting: Cardiovascular Disease

## 2018-10-06 ENCOUNTER — Other Ambulatory Visit: Payer: Self-pay | Admitting: Cardiovascular Disease

## 2018-10-18 ENCOUNTER — Telehealth: Payer: Self-pay | Admitting: Cardiovascular Disease

## 2018-10-18 NOTE — Telephone Encounter (Signed)
Call daughter's iphone [phone]/ consent/ pre reg completed

## 2018-10-19 ENCOUNTER — Telehealth (INDEPENDENT_AMBULATORY_CARE_PROVIDER_SITE_OTHER): Payer: Medicare Other | Admitting: Cardiovascular Disease

## 2018-10-19 ENCOUNTER — Encounter: Payer: Self-pay | Admitting: Cardiovascular Disease

## 2018-10-19 VITALS — BP 162/86 | HR 66 | Ht 65.0 in | Wt 183.0 lb

## 2018-10-19 DIAGNOSIS — I739 Peripheral vascular disease, unspecified: Secondary | ICD-10-CM | POA: Diagnosis not present

## 2018-10-19 NOTE — Progress Notes (Signed)
Virtual Visit via Video Note   This visit type was conducted due to national recommendations for restrictions regarding the COVID-19 Pandemic (e.g. social distancing) in an effort to limit this patient's exposure and mitigate transmission in our community.  Due to her co-morbid illnesses, this patient is at least at moderate risk for complications without adequate follow up.  This format is felt to be most appropriate for this patient at this time.  All issues noted in this document were discussed and addressed.  A limited physical exam was performed with this format.  Please refer to the patient's chart for her consent to telehealth for [facility].   Date:  10/19/2018   ID:  Brenda [name], DOB [birth year], MRN [id number]  Patient Location: Home Provider Location: Office  PCP:  [facility]  Cardiologist:  Sherren Mocha, MD  Electrophysiologist:  None   Evaluation Performed:  Follow-Up Visit  Chief Complaint: Bilateral leg pain  History of Present Illness:    Brenda [name] is a 58 y.o. female was seen via video visit today for follow-up regarding peripheral arterial disease. She has multiple medical problems including coronary artery disease, chronic systolic heart failure with subsequent improvement in ejection fraction, COPD, previous tobacco use, hypertension, hyperlipidemia and extensive peripheral arterial disease. She had previous right renal artery stenting in 5701 which was complicated by wire induced perforation that was treated with coils.  She had in-stent restenosis that was subsequently treated with covered stent placement. She also known to have history of iliac artery disease that has been managed conservatively given that her symptoms were not lifestyle limiting.  She also has known history of carotid disease status post bilateral carotid endarterectomy and right carotid to subclavian artery bypass.  She was seen recently for  worsening iliac disease.  Her symptoms included bilateral calf pain with walking that happens after walking about 1-2 blocks. Vascular studies last year showed progression of iliac disease with velocity of 529 in the right common iliac and 466 in the left common iliac.  ABI was borderline reduced.  She reports that her symptoms are unchanged.  The patient does not have symptoms concerning for COVID-19 infection (fever, chills, cough, or new shortness of breath).    Past Medical History:  Diagnosis Date  . Anemia   . Arthritis   . CAD (coronary artery disease)   . Cardiomyopathy   . Carotid artery occlusion   . CHF (congestive heart failure) ([place]) 2007  . COPD (chronic obstructive pulmonary disease) ([facility])    not on home o2  . HTN (hypertension)   . Hypercholesteremia   . Pulmonary nodule   . PVD (peripheral vascular disease) ([place])   . Renal artery stenosis [facility])    Past Surgical History:  Procedure Laterality Date  . CARDIAC CATHETERIZATION    . CHOLECYSTECTOMY    . CORONARY STENT PLACEMENT      Successful stenting of the right renal ostium   complicated by distal wire perforation.  The wire perforation was  treated with embolization of the branch vessels involved.  Will check CT   scan to assess the right renal parenchymal hematoma.  She will be   maintained on both aspirin and Plavix.  Will place a Foley catheter to   avoid clotting since hematuria is likely.    Marland Kitchen EXTERNAL EAR SURGERY     as a child  . GALLBLADDER SURGERY          Allergies:  Lisinopril and Oxycodone   Social History   Tobacco Use  . Smoking status: Former Smoker    Packs/day: 1.00    Years: 31.00    Pack years: 31.00    Types: Cigarettes    Last attempt to quit: 06/09/2005    Years since quitting: 13.3  . Smokeless tobacco: Never Used  Substance Use Topics  . Alcohol use: No    Alcohol/week: 0.0 standard drinks  . Drug use: No     Family Hx: The patient's family history includes Cancer in  her father; Coronary artery disease in an other family member; Heart disease (age of onset: 7) in her mother; Hyperlipidemia in her father and mother; Hypertension in her father and mother; Peripheral vascular disease in her mother.  ROS:   Please see the history of present illness.     All other systems reviewed and are negative.   Prior CV studies:   The following studies were reviewed today:    Labs/Other Tests and Data Reviewed:    EKG:  No ECG reviewed.  Recent Labs: No results found for requested labs within last 8760 hours.   Recent Lipid Panel Lab Results  Component Value Date/Time   CHOL 135 03/27/2017 09:34 AM   TRIG 134 03/27/2017 09:34 AM   HDL 41 03/27/2017 09:34 AM   CHOLHDL 3.3 03/27/2017 09:34 AM   CHOLHDL 3.8 03/04/2016 09:59 AM   LDLCALC 67 03/27/2017 09:34 AM    Wt Readings from Last 3 Encounters:  10/19/18 183 lb (83 kg)  07/27/18 181 lb 9.6 oz (82.4 kg)  07/05/18 183 lb 12.8 oz (83.4 kg)     Objective:    Vital Signs:  BP (!) 162/86   Pulse 66   Ht 5\' 5"  (1.651 m)   Wt 183 lb (83 kg)   BMI 30.45 kg/m    VITAL SIGNS:  reviewed GEN:  no acute distress EYES:  sclerae anicteric, EOMI - Extraocular Movements Intact RESPIRATORY:  normal respiratory effort, symmetric expansion CARDIOVASCULAR:  no peripheral edema SKIN:  no rash, lesions or ulcers. MUSCULOSKELETAL:  no obvious deformities. NEURO:  alert and oriented x 3, no obvious focal deficit PSYCH:  normal affect  ASSESSMENT & PLAN:    1.  Peripheral arterial disease with severe bilateral leg claudication due to iliac artery disease ([place]utherford class III): She reports that her symptoms are stable since her last visit with no worsening.    I will follow-up with her in 3 months from now and likely proceed with angiography and planned endovascular intervention at that time.  2.  Carotid artery disease: Followed by [facility].  3.  Hyperlipidemia: Currently on simvastatin 40 mg daily.  Most  recent LDL was 67.  4.  Essential hypertension: Blood pressures continues to be mildly elevated.  Antihypertensive medications might need to be adjusted.   COVID-19 Education: The signs and symptoms of COVID-19 were discussed with the patient and how to seek care for testing (follow up with PCP or arrange E-visit).  The importance of social distancing was discussed today.  Time:   Today, I have spent 15 minutes with the patient with telehealth technology discussing the above problems.     Medication Adjustments/Labs and Tests Ordered: Current medicines are reviewed at length with the patient today.  Concerns regarding medicines are outlined above.   Tests Ordered: No orders of the defined types were placed in this encounter.   Medication Changes: No orders of the defined types were placed in this encounter.  Disposition:  Follow up in 3 month(s)  Signed, Kathlyn Sacramento, MD  10/19/2018 9:52 AM    [facility]

## 2018-10-19 NOTE — Patient Instructions (Addendum)
Medication Instructions:  No change in medications If you need a refill on your cardiac medications before your next appointment, please call your pharmacy.   Lab work: None If you have labs (blood work) drawn today and your tests are completely normal, you will receive your results only by: Marland Kitchen MyChart Message (if you have MyChart) OR . A paper copy in the mail If you have any lab test that is abnormal or we need to change your treatment, we will call you to review the results.  Testing/Procedures: None  Follow-Up: Follow-up with Dr. Fletcher Anon in 3 months

## 2018-10-20 ENCOUNTER — Other Ambulatory Visit (HOSPITAL_COMMUNITY): Payer: Self-pay | Admitting: Cardiovascular Disease

## 2018-10-20 DIAGNOSIS — I739 Peripheral vascular disease, unspecified: Secondary | ICD-10-CM

## 2018-10-20 DIAGNOSIS — I700 Atherosclerosis of aorta: Secondary | ICD-10-CM

## 2018-10-20 DIAGNOSIS — I708 Atherosclerosis of other arteries: Secondary | ICD-10-CM

## 2019-01-20 ENCOUNTER — Emergency Department (HOSPITAL_COMMUNITY): Payer: Medicare Other

## 2019-01-20 ENCOUNTER — Other Ambulatory Visit: Payer: Self-pay

## 2019-01-20 ENCOUNTER — Encounter (HOSPITAL_COMMUNITY): Payer: Self-pay | Admitting: Emergency Medicine

## 2019-01-20 ENCOUNTER — Emergency Department (HOSPITAL_COMMUNITY)
Admission: EM | Admit: 2019-01-20 | Discharge: 2019-01-20 | Disposition: A | Discharge status: Home / Self Care | Payer: Medicare Other | Attending: Emergency Medicine | Admitting: Emergency Medicine

## 2019-01-20 DIAGNOSIS — Z87891 Personal history of nicotine dependence: Secondary | ICD-10-CM | POA: Insufficient documentation

## 2019-01-20 DIAGNOSIS — I5032 Chronic diastolic (congestive) heart failure: Secondary | ICD-10-CM | POA: Diagnosis not present

## 2019-01-20 DIAGNOSIS — I739 Peripheral vascular disease, unspecified: Secondary | ICD-10-CM | POA: Diagnosis not present

## 2019-01-20 DIAGNOSIS — I110 Hypertensive heart disease with heart failure: Secondary | ICD-10-CM | POA: Insufficient documentation

## 2019-01-20 DIAGNOSIS — R109 Unspecified abdominal pain: Secondary | ICD-10-CM

## 2019-01-20 DIAGNOSIS — Z955 Presence of coronary angioplasty implant and graft: Secondary | ICD-10-CM | POA: Diagnosis not present

## 2019-01-20 DIAGNOSIS — K8020 Calculus of gallbladder without cholecystitis without obstruction: Secondary | ICD-10-CM | POA: Diagnosis not present

## 2019-01-20 DIAGNOSIS — I2510 Atherosclerotic heart disease of native coronary artery without angina pectoris: Secondary | ICD-10-CM | POA: Insufficient documentation

## 2019-01-20 LAB — COMPREHENSIVE METABOLIC PANEL
ALT: 17 U/L (ref 0–44)
AST: 24 U/L (ref 15–41)
Albumin: 4.2 g/dL (ref 3.5–5.0)
Alkaline Phosphatase: 73 U/L (ref 38–126)
Anion gap: 11 (ref 5–15)
BUN: 13 mg/dL (ref 6–20)
CO2: 20 mmol/L — ABNORMAL LOW (ref 22–32)
Calcium: 9.5 mg/dL (ref 8.9–10.3)
Chloride: 107 mmol/L (ref 98–111)
Creatinine, Ser: 1.63 mg/dL — ABNORMAL HIGH (ref 0.44–1.00)
GFR calc Af Amer: 40 mL/min — ABNORMAL LOW (ref >60)
GFR calc non Af Amer: 35 mL/min — ABNORMAL LOW (ref >60)
Glucose, Bld: 106 mg/dL — ABNORMAL HIGH (ref 70–99)
Potassium: 4.6 mmol/L (ref 3.5–5.1)
Sodium: 138 mmol/L (ref 135–145)
Total Bilirubin: 0.8 mg/dL (ref 0.3–1.2)
Total Protein: 7.8 g/dL (ref 6.5–8.1)

## 2019-01-20 LAB — URINALYSIS, ROUTINE W REFLEX MICROSCOPIC
Bilirubin Urine: NEGATIVE
Glucose, UA: NEGATIVE mg/dL
Hgb urine dipstick: NEGATIVE
Ketones, ur: NEGATIVE mg/dL
Leukocytes,Ua: NEGATIVE
Nitrite: NEGATIVE
Protein, ur: NEGATIVE mg/dL
Specific Gravity, Urine: 1.015 (ref 1.005–1.030)
pH: 8 (ref 5.0–8.0)

## 2019-01-20 LAB — CBC
HCT: 48.8 % — ABNORMAL HIGH (ref 36.0–46.0)
Hemoglobin: 15.9 g/dL — ABNORMAL HIGH (ref 12.0–15.0)
MCH: 31.1 pg (ref 26.0–34.0)
MCHC: 32.6 g/dL (ref 30.0–36.0)
MCV: 95.3 fL (ref 80.0–100.0)
Platelets: 371 10*3/uL (ref 150–400)
RBC: 5.12 MIL/uL — ABNORMAL HIGH (ref 3.87–5.11)
RDW: 12.8 % (ref 11.5–15.5)
WBC: 12.7 10*3/uL — ABNORMAL HIGH (ref 4.0–10.5)
nRBC: 0 % (ref 0.0–0.2)

## 2019-01-20 LAB — LIPASE, BLOOD: Lipase: 36 U/L (ref 11–51)

## 2019-01-20 MED ORDER — SODIUM CHLORIDE 0.9 % IV BOLUS
1000.0000 mL | Freq: Once | INTRAVENOUS | Status: AC
  Administered 2019-01-20: 1000 mL via INTRAVENOUS

## 2019-01-20 MED ORDER — IOHEXOL 300 MG/ML  SOLN
66.0000 mL | Freq: Once | INTRAMUSCULAR | Status: AC | PRN
  Administered 2019-01-20: 66 mL via INTRAVENOUS

## 2019-01-20 MED ORDER — SODIUM CHLORIDE 0.9% FLUSH
3.0000 mL | Freq: Once | INTRAVENOUS | Status: AC
  Administered 2019-01-20: 3 mL via INTRAVENOUS

## 2019-01-20 MED ORDER — OXYCODONE-ACETAMINOPHEN 5-325 MG PO TABS: 1.0000 | ORAL_TABLET | Freq: Four times a day (QID) | ORAL | 0 refills | Status: DC | PRN

## 2019-01-20 MED ORDER — ONDANSETRON HCL 4 MG PO TABS: 4.0000 mg | ORAL_TABLET | Freq: Four times a day (QID) | ORAL | 0 refills | Status: DC

## 2019-01-20 NOTE — ED Notes (Signed)
Patient transported to [facility] 

## 2019-01-20 NOTE — ED Triage Notes (Signed)
Pt has right flank pain that started on Monday pt states she was seen at pcp for what she thought was a kidney stone and states she was told she had an infection pt is unsure if this was a uti but was placed on cipro. Pt reports pain in right flank is worse and radiates into abd.

## 2019-01-20 NOTE — ED Provider Notes (Signed)
[facility] Provider Note   CSN: [id number] Arrival date & time: 01/20/19  1241    History   Chief Complaint Chief Complaint  Patient presents with   Flank Pain    HPI Brenda [name] is a 58 y.o. female with past medical history of renal artery stenosis, hypertension, hypercholesterolemia, COPD, congestive heart failure,  coronary artery disease who presents to the ED today complaining of gradual onset, constant, achy, right flank pain that started a few days ago with nausea.  Patient was seen at urgent care and diagnosed with a UTI started on Cipro.  She reports the pain was so bad today that she came to the ED after being told she may need imaging done.  Patient has been taking ibuprofen as needed for the pain.  She reports being compliant with antibiotics.  Denies fever, chills, abdominal pain, vomiting,  Diarrhea, constipation, urinary symptoms.  Patient reports history of kidney stones last year and this feels similar.       Past Medical History:  Diagnosis Date   Anemia    Arthritis    CAD (coronary artery disease)    Cardiomyopathy    Carotid artery occlusion    CHF (congestive heart failure) ([place]) 2007   COPD (chronic obstructive pulmonary disease) (H[facility])    not on home o2   HTN (hypertension)    Hypercholesteremia    Pulmonary nodule    PVD (peripheral vascular disease) ([place])    Renal artery stenosis [facility])     Patient Active Problem List   Diagnosis Date Noted   Chronic diastolic CHF (congestive heart failure) ([place]) 11/02/2014   Acute respiratory failure with hypoxia ([place]) 11/01/2014   COPD exacerbation ([place]) 10/31/2014   Hyperglycemia 10/31/2014   Polycythemia 10/31/2014   Strep throat    Research subject 10/10/2011   ANEMIA, CHRONIC 11/03/2008   HYPERTENSION 11/03/2008   CAD 11/03/2008   CAROTID ARTERY OCCLUSION 11/03/2008   PULMONARY NODULE, SOLITARY 11/03/2008   HYPERCHOLESTEROLEMIA  10/20/2008   CARDIOMYOPATHY 10/20/2008   COPD 10/20/2008   RENAL ARTERY STENOSIS 10/09/2008   Atherosclerosis of native arteries of the extremities with intermittent claudication 10/09/2008    Past Surgical History:  Procedure Laterality Date   CARDIAC CATHETERIZATION     CHOLECYSTECTOMY     CORONARY STENT PLACEMENT      Successful stenting of the right renal ostium   complicated by distal wire perforation.  The wire perforation was  treated with embolization of the branch vessels involved.  Will check CT   scan to assess the right renal parenchymal hematoma.  She will be   maintained on both aspirin and Plavix.  Will place a Foley catheter to   avoid clotting since hematuria is likely.     EXTERNAL EAR SURGERY     as a child   GALLBLADDER SURGERY       OB History   No obstetric history on file.      Home Medications      Family History Family History  Problem Relation Age of Onset   Heart disease Mother 35       Heart Disease before age 67   Hypertension Mother    Hyperlipidemia Mother    Peripheral vascular disease Mother    Cancer Father    Hyperlipidemia Father    Hypertension Father    Coronary artery disease Other     Social History Social History   Tobacco Use   Smoking status: Former Smoker  Packs/day: 1.00    Years: 31.00    Pack years: 31.00    Types: Cigarettes    Quit date: 06/09/2005    Years since quitting: 13.6   Smokeless tobacco: Never Used  Substance Use Topics   Alcohol use: No    Alcohol/week: 0.0 standard drinks   Drug use: No     Allergies   Lisinopril and Oxycodone   Review of Systems Review of Systems  Constitutional: Negative for chills and fever.  HENT: Negative for congestion.   Eyes: Negative for visual disturbance.  Respiratory: Negative for cough.   Cardiovascular: Negative for chest pain.  Gastrointestinal: Positive for abdominal pain and nausea. Negative for constipation, diarrhea and  vomiting.  Genitourinary: Positive for flank pain. Negative for dysuria and frequency.  Musculoskeletal: Negative for myalgias.  Skin: Negative for rash.  Neurological: Negative for headaches.     Physical Exam Updated Vital Signs BP (!) 156/87    Pulse 95    Temp 98 F (36.7 C) (Oral)    Resp 18    SpO2 98%   Physical Exam Vitals signs and nursing note reviewed.  Constitutional:      Appearance: She is not ill-appearing.  HENT:     Head: Normocephalic and atraumatic.  Eyes:     Conjunctiva/sclera: Conjunctivae normal.  Neck:     Musculoskeletal: Neck supple.  Cardiovascular:     Rate and Rhythm: Normal rate and regular rhythm.  Pulmonary:     Effort: Pulmonary effort is normal.     Breath sounds: Normal breath sounds. No wheezing or rales.  Abdominal:     Palpations: Abdomen is soft.     Tenderness: There is abdominal tenderness. There is right CVA tenderness. There is no left CVA tenderness, guarding or rebound.     Comments: Soft, mild tenderness to right sided abdomen both upper and lower quadrants, +BS throughout, no r/g/r, neg murphy's, neg mcburney's, + right CVA tenderness   Skin:    General: Skin is warm and dry.  Neurological:     Mental Status: She is alert.      ED Treatments / Results  Labs (all labs ordered are listed, but only abnormal results are displayed) Labs Reviewed  COMPREHENSIVE METABOLIC PANEL - Abnormal; Notable for the following components:      Result Value   CO2 20 (*)    Glucose, Bld 106 (*)    Creatinine, Ser 1.63 (*)    GFR calc non Af Amer 35 (*)    GFR calc Af Amer 40 (*)    All other components within normal limits  CBC - Abnormal; Notable for the following components:   WBC 12.7 (*)    RBC 5.12 (*)    Hemoglobin 15.9 (*)    HCT 48.8 (*)    All other components within normal limits  LIPASE, BLOOD  URINALYSIS, ROUTINE W REFLEX MICROSCOPIC    EKG None  Radiology Ct Abdomen Pelvis W Contrast  Result Date:  01/20/2019 CLINICAL DATA:  Abdominal pain, acute generalized, worse right flank and right lower quadrant EXAM: CT ABDOMEN AND PELVIS WITH CONTRAST TECHNIQUE: Multidetector CT imaging of the abdomen and pelvis was performed using the standard protocol following bolus administration of intravenous contrast. CONTRAST:  78mL OMNIPAQUE IOHEXOL 300 MG/ML  SOLN COMPARISON:  CT renal colic 35/59/7416, CT angiography 03/31/2017 FINDINGS: Lower chest: Extensive centrilobular emphysematous changes are present in the lung bases with bandlike areas of opacity likely reflecting areas of atelectasis and/or scarring. Normal heart  size. No pericardial effusion. Hepatobiliary: No concerning liver lesions. Few calcified gallstones are present dependently within the gallbladder. No pericholecystic fluid or inflammation. No calcified intraductal gallstones. No biliary ductal dilatation. Pancreas: Unremarkable. No pancreatic ductal dilatation or surrounding inflammatory changes. Spleen: Normal in size without focal abnormality. Adrenals/Urinary Tract: Normal adrenal glands. Few punctate metallic radiodensities are present in the right renal parenchyma with areas of cortical scarring. No visible urolithiasis. Mild bilateral pelvicaliectasis without frank hydronephrosis. Urinary bladder is unremarkable. Stomach/Bowel: Distal esophagus, stomach and duodenal sweep are unremarkable. No bowel wall thickening or dilatation. No evidence of obstruction. A normal appendix is visualized. Scattered colonic diverticula without focal pericolonic inflammation to suggest diverticulitis. Vascular/Lymphatic: Extensive atherosclerotic plaque throughout the native aorta and inflow vessels. Additional calcifications are present in the splanchnic vascularity as well. No proximal occlusions. Vascular stents are noted at the renal artery ostia as well as a kissing stent configuration the proximal common iliac arteries. Please note study is not tailored for  evaluation of the patency of stents. No suspicious or enlarged lymph nodes in the included lymphatic chains. Reproductive: Anteverted uterus. No concerning adnexal lesions. Other: No abdominopelvic free fluid or free gas. No bowel containing hernias. Small fat containing umbilical hernia. Musculoskeletal: Multilevel degenerative changes are present in the imaged portions of the spine. No acute osseous abnormality or suspicious osseous lesion. IMPRESSION: 1. No acute intra-abdominal process. Normal appendix. 2. Cholelithiasis without CT evidence of acute cholecystitis. 3. Mild bilateral renal pelvicaliectasis without visible urolithiasis or frank hydronephrosis. Consider correlation with urinalysis. Right renal scarring and postsurgical changes. 4. Aortic Atherosclerosis (ICD10-I70.0). 5. Vascular stents present at the renal artery ostia and proximal common iliac arteries. Study is not tailored for evaluation of the patency of the stents. Electronically Signed   By: Lovena Le M.D.   On: 01/20/2019 18:13   US Abdomen Limited Ruq  Result Date: 01/20/2019 CLINICAL DATA:  Abdominal pain EXAM: ULTRASOUND ABDOMEN LIMITED RIGHT UPPER QUADRANT COMPARISON:  CT same day FINDINGS: Gallbladder: Layering gallstones are present the largest measuring 7.2 mm. Level or wall thickness is 2.5 mm. No sonographic Murphy sign or pericholecystic fluid. Common bile duct: Diameter: 6.8 mm Liver: Increased echotexture seen throughout. No focal abnormality or biliary ductal dilatation. Portal vein is patent on color Doppler imaging with normal direction of blood flow towards the liver. Other: None. IMPRESSION: Cholelithiasis without evidence of acute cholecystitis Hepatic steatosis Electronically Signed   By: Prudencio Pair M.D.   On: 01/20/2019 19:21    Procedures Procedures (including critical care time)  Medications Ordered in ED Medications  sodium chloride flush (NS) 0.9 % injection 3 mL (3 mLs Intravenous Given 01/20/19 1544)   sodium chloride 0.9 % bolus 1,000 mL (1,000 mLs Intravenous New Bag/Given 01/20/19 1543)  iohexol (OMNIPAQUE) 300 MG/ML solution 66 mL (66 mLs Intravenous Contrast Given 01/20/19 1742)     Initial Impression / Assessment and Plan / ED Course  I have reviewed the triage vital signs and the nursing notes.  Pertinent labs & imaging results that were available during my care of the patient were reviewed by me and considered in my medical decision making (see chart for details).        Patient is a 58 year old female who presents to the ED today for right flank pain.  Seen by urgent care a few days ago and diagnosed with a UTI started on Cipro.  Presenting today for ongoing pain.  Reports she was told to come to the ED for further evaluation.  Patient has a right CVA tenderness on exam as well as right upper and lower quadrant abdominal pain.  No peritoneal signs.  Baseline blood work obtained prior to being seen.  Patient does have AKI today with creatinine 1.63.  Elevated white blood cell count 12.7.  Does appear hemoconcentrated today.  Will provide IV fluids for both AKI and dehydration.  Urinalysis clear without infection.  This is likely due to patient being on Cipro already.  Will obtain CT renal stone study today.    Urinalysis clear today suspect this is from the patient taking Cipro.  Creatinine elevated at 1.63.  Likely due to dehydration.  Will give IV fluids.  White blood cell count elevated at 12,000.  Patient also appears hemoconcentrated with hemoglobin of 15.9.  She initially declined IV fluids requesting for pain medicine.  When speaking with her about her allergy to oxycodone she reports actually I am not in too much pain right now I will take the IV fluids.  We will continue to reevaluate for pain medication requirement.  CT scan negative for any pyelonephritis or kidney stones.  Does show some cholelithiasis without obvious cholecystitis on CAT scan.  Patient does have tenderness  to the right upper quadrant.  Will obtain right upper quadrant ultrasound to date to rule out cholecystitis with white blood cell count elevation.  White blood cells may be elevated due to pain versus infection.  Is afebrile in the ED currently.   Ultrasound positive for cholelithiasis without evidence of cholecystitis.  Will discharge patient home with short course of pain medication and referral to [facility] surgery.  Patient advised to continue taking antibiotics as prescribed by PCP.  Stable for discharge at this time.       Final Clinical Impressions(s) / ED Diagnoses   Final diagnoses:  Abdominal pain  Right flank pain  Calculus of gallbladder without cholecystitis without obstruction    ED Discharge Orders         Ordered    oxyCODONE-acetaminophen (PERCOCET/ROXICET) 5-325 MG tablet  Every 6 hours PRN     01/20/19 1940    ondansetron (ZOFRAN) 4 MG tablet  Every 6 hours     01/20/19 1940           Eustaquio Maize, PA-C 01/20/19 1941    Hayden Rasmussen, MD 01/22/19 [phone]

## 2019-01-20 NOTE — Discharge Instructions (Addendum)
You were seen in the ED today for continued pain of your right flank.  Also had some tenderness in your right upper quadrant.  You lab work was very reassuring today.  You did have a little bit of an elevation on your white blood cell count which could suggest infection versus pain.  I think it is elevated due to pain.    A CT scan of your abdomen did not show any acute findings besides some stones in your gallbladder which was confirmed on ultrasound today.   I have prescribed a short course of pain medication for you to take.  I would suggest taking ibuprofen if you still have continued pain and then to use the narcotic pain medication. Please follow-up with [facility] regarding your gallstones.  Continue taking the antibiotics as prescribed by your primary care doctor for your UTI.

## 2019-01-20 NOTE — ED Notes (Signed)
Patient verbalizes understanding of discharge instructions. Opportunity for questioning and answers were provided. Armband removed by staff, pt discharged from ED.  

## 2019-01-20 NOTE — ED Notes (Signed)
Patient transported to CT 

## 2019-01-28 ENCOUNTER — Ambulatory Visit: Payer: Self-pay | Admitting: General Surgery

## 2019-01-28 ENCOUNTER — Other Ambulatory Visit: Payer: Self-pay | Admitting: Cardiovascular Disease

## 2019-01-28 NOTE — H&P (Signed)
History of Present Illness Brenda Ok MD; 01/28/2019 3:10 PM) The patient is a 58 year old female who presents for evaluation of gall stones. Referred by: Dr. Aletta Edouard Chief Complaint: Abdominal pain  Patient is a 58 year old female with a history of hypercholesterolemia, hypertension, CAD, carotid artery occlusion, COPD, with a history of abdominal pain. Patient was recently in the ER last week secondary to right-sided abdominal pain. Patient does she was passing a kidney stone. Patient underwent imaging at been on antibiotics for approximately 3-4 days. Her initial visit with her PCP, was thought to be kidney infection. Upon evaluation ER she would laboratory studies and ultrasound. Ultrasound we'll send the skin for gallstones. I did review this personally. Her laboratory studies revealed LfT within normal limits.  Patient sees Dr. Burt Knack her cardiologist. Patient is on [place].    Past Surgical History Emeline Gins, [place]; 01/28/2019 2:54 PM) No pertinent past surgical history   Diagnostic Studies History Emeline Gins, [place]; 01/28/2019 2:54 PM) Colonoscopy  5-10 years ago Mammogram  1-3 years ago Pap Smear  1-5 years ago  Allergies Emeline Gins, [place]; 01/28/2019 2:56 PM) Lisinopril *ANTIHYPERTENSIVES*  Allergies Reconciled   Medication History Emeline Gins, CMA; 01/28/2019 2:55 PM) Ondansetron HCl (4MG  Tablet, Oral) Active. oxyCODONE-Acetaminophen (5-325MG  Tablet, Oral) Active. Carvedilol (6.25MG  Tablet, Oral) Active. Candesartan Cilexetil (16MG  Tablet, Oral) Active. Simvastatin (40MG  Tablet, Oral) Active. Brilinta (60MG  Tablet, Oral) Active. Spiriva HandiHaler (18MCG Capsule, Inhalation) Active. Medications Reconciled  Social History Emeline Gins, [place]; 01/28/2019 2:54 PM) Caffeine use  Carbonated beverages. No drug use  Tobacco use  Current every day smoker.  Family History Emeline Gins, [place]; 01/28/2019 2:54 PM) Breast  Cancer  Sister. Cancer  Father. Heart Disease  Mother. Heart disease in female family member before age 47  Hypertension  Mother.  Pregnancy / Birth History Emeline Gins, [place]; 01/28/2019 2:54 PM) Age at menarche  36 years. Age of menopause  47-50 Gravida  2 Maternal age  42-20 Para  2 Regular periods   Other Problems Emeline Gins, CMA; 01/28/2019 2:54 PM) Arthritis  Cholelithiasis  Congestive Heart Failure  Heart murmur  High blood pressure  Home Oxygen Use  Kidney Stone  Vascular Disease     Review of Systems Brenda Ok MD; 01/28/2019 3:06 PM) General Not Present- Appetite Loss, Chills, Fatigue, Fever, Night Sweats, Weight Gain and Weight Loss. Skin Not Present- Change in Wart/Mole, Dryness, Hives, Jaundice, New Lesions, Non-Healing Wounds, Rash and Ulcer. HEENT Present- Hearing Loss. Not Present- Earache, Hoarseness, Nose Bleed, Oral Ulcers, Ringing in the Ears, Seasonal Allergies, Sinus Pain, Sore Throat, Visual Disturbances, Wears glasses/contact lenses and Yellow Eyes. Respiratory Not Present- Cough and Difficulty Breathing. Cardiovascular Not Present- Chest Pain, Difficulty Breathing Lying Down, Leg Cramps, Palpitations, Rapid Heart Rate, Shortness of Breath and Swelling of Extremities. Gastrointestinal Present- Abdominal Pain. Not Present- Bloating, Bloody Stool, Change in Bowel Habits, Chronic diarrhea, Constipation, Difficulty Swallowing, Excessive gas, Gets full quickly at meals, Hemorrhoids, Indigestion, Nausea, Rectal Pain and Vomiting. Female Genitourinary Not Present- Frequency, Nocturia, Painful Urination, Pelvic Pain and Urgency. Musculoskeletal Not Present- Back Pain, Joint Pain, Joint Stiffness, Muscle Pain, Muscle Weakness and Swelling of Extremities. Neurological Present- Trouble walking. Not Present- Decreased Memory, Fainting, Headaches, Numbness, Seizures, Tingling, Tremor and Weakness. Psychiatric Not Present- Anxiety, Bipolar,  Change in Sleep Pattern, Depression, Fearful and Frequent crying. Endocrine Not Present- Cold Intolerance, Excessive Hunger, Hair Changes, Heat Intolerance, Hot flashes and New Diabetes. Hematology Present- Blood Thinners and Easy Bruising. Not Present- Excessive bleeding, Gland problems, HIV  and Persistent Infections. All other systems negative  Vitals Emeline Gins CMA; 01/28/2019 2:54 PM) 01/28/2019 2:54 PM Weight: 183 lb Height: 65in Body Surface Area: 1.9 m Body Mass Index: 30.45 kg/m  Temp.: 98.69F  Pulse: 81 (Regular)  BP: 154/100(Sitting, Left Arm, Standard)       Physical Exam Brenda Ok MD; 01/28/2019 3:10 PM) The physical exam findings are as follows: Note: Constitutional: No acute distress, conversant, appears stated age  Eyes: Anicteric sclerae, moist conjunctiva, no lid lag  Neck: No thyromegaly, trachea midline, no cervical lymphadenopathy  Lungs: Clear to auscultation biilaterally, normal respiratory effot  Cardiovascular: regular rate & rhythm, no murmurs, no peripheal edema, pedal pulses 2+  GI: Soft, no masses or hepatosplenomegaly, non-tender to palpation  MSK: Normal gait, no clubbing cyanosis, edema  Skin: No rashes, palpation reveals normal skin turgor  Psychiatric: Appropriate judgment and insight, oriented to person, place, and time    Assessment & Plan Brenda Ok MD; 01/28/2019 3:11 PM)  SYMPTOMATIC CHOLELITHIASIS (K80.20) Impression: Patient is a 58 year old female with symptomatic cholelithiasis. Patient has a long history of hypercholesterolemia, hypertension, CAD, carotid artery occlusion, COPD. Patient currently is on Brilinta.  1. Will obtain cardiac clearance from Dr. Burt Knack prior to scheduling surgery. 2 We will proceed to the operating room for a laparoscopic cholecystectomy  3. Risks and benefits were discussed with the patient to generally include, but not limited to: infection, bleeding, possible need for  post op ERCP, damage to the bile ducts, bile leak, and possible need for further surgery. Alternatives were offered and described. All questions were answered and the patient voiced understanding of the procedure and wishes to proceed at this point with a laparoscopic cholecystectomy

## 2019-01-31 ENCOUNTER — Telehealth: Payer: Self-pay | Admitting: *Deleted

## 2019-01-31 NOTE — Telephone Encounter (Signed)
Dr. Fletcher Anon,  This patient has a remote history of right renal artery stenting (2008) with in-stent restenosis treated with covered stent, bilateral carotid endarterectomies and right carotid to subclavian artery bypass, and bilateral iliac stenting. She is maintained on 60 mg brilinta BID. OK to hold brilinta for a lap chole? If so, how long do you recommend?

## 2019-01-31 NOTE — Telephone Encounter (Signed)
Hold Brilinta 5 days before surgery.  She should take aspirin 81 mg once daily instead until she resumes Brilinta.

## 2019-01-31 NOTE — Telephone Encounter (Signed)
   [facility] Pre-operative Risk Assessment    Request for surgical clearance:  1. What type of surgery is being performed? LAP CHOLE   2. When is this surgery scheduled? TBD   3. What type of clearance is required (medical clearance vs. Pharmacy clearance to hold med vs. Both)? MEDICAL  4. Are there any medications that need to be held prior to surgery and how long? BRILINTA   5. Practice name and name of physician performing surgery? [facility]; DR. Anne Hahn RAMIREZ   6. What is your office phone number [phone]    7.   What is your office fax number [phone]  8.   Anesthesia type (None, local, MAC, general) ? GENERAL    Julaine Hua 01/31/2019, 11:29 AM  _________________________________________________________________   (provider comments below)

## 2019-02-01 NOTE — Telephone Encounter (Signed)
Left VM

## 2019-02-01 NOTE — Telephone Encounter (Signed)
   Primary Cardiologist: Sherren Mocha, MD  Chart reviewed as part of pre-operative protocol coverage. Patient was contacted 02/01/2019 in reference to pre-operative risk assessment for pending surgery as outlined below.  Brenda [name] was last seen on 10/19/18 by Dr. Fletcher Anon.  Since that day, Brenda [name] has done well. She can complete more than 4.0 METS without anginal symptoms. She denies changes in her cardiac history.   Per Dr. Fletcher Anon, [place] 5 days before surgery.  She should take aspirin 81 mg once daily instead until she resumes Brilinta  Therefore, based on ACC/AHA guidelines, the patient would be at acceptable risk for the planned procedure without further cardiovascular testing.   I will route this recommendation to the requesting party via Epic fax function and remove from pre-op pool.  Please call with questions.  Tami Lin Duke, PA 02/01/2019, 12:06 PM

## 2019-02-23 ENCOUNTER — Other Ambulatory Visit: Payer: Self-pay

## 2019-02-23 ENCOUNTER — Encounter (HOSPITAL_COMMUNITY): Payer: Self-pay

## 2019-02-23 ENCOUNTER — Encounter (HOSPITAL_COMMUNITY)
Admission: RE | Admit: 2019-02-23 | Discharge: 2019-02-23 | Disposition: A | Discharge status: Home / Self Care | Payer: Medicare Other | Source: Ambulatory Visit | Attending: General Surgery | Admitting: General Surgery

## 2019-02-23 DIAGNOSIS — Z01812 Encounter for preprocedural laboratory examination: Secondary | ICD-10-CM | POA: Insufficient documentation

## 2019-02-23 LAB — CBC
HCT: 46.1 % — ABNORMAL HIGH (ref 36.0–46.0)
Hemoglobin: 15.4 g/dL — ABNORMAL HIGH (ref 12.0–15.0)
MCH: 31.8 pg (ref 26.0–34.0)
MCHC: 33.4 g/dL (ref 30.0–36.0)
MCV: 95.2 fL (ref 80.0–100.0)
Platelets: 346 10*3/uL (ref 150–400)
RBC: 4.84 MIL/uL (ref 3.87–5.11)
RDW: 12.5 % (ref 11.5–15.5)
WBC: 10.7 10*3/uL — ABNORMAL HIGH (ref 4.0–10.5)
nRBC: 0 % (ref 0.0–0.2)

## 2019-02-23 LAB — BASIC METABOLIC PANEL
Anion gap: 7 (ref 5–15)
BUN: 7 mg/dL (ref 6–20)
CO2: 24 mmol/L (ref 22–32)
Calcium: 9.5 mg/dL (ref 8.9–10.3)
Chloride: 107 mmol/L (ref 98–111)
Creatinine, Ser: 0.81 mg/dL (ref 0.44–1.00)
GFR calc Af Amer: >60 mL/min (ref >60)
GFR calc non Af Amer: >60 mL/min (ref >60)
Glucose, Bld: 85 mg/dL (ref 70–99)
Potassium: 4.6 mmol/L (ref 3.5–5.1)
Sodium: 138 mmol/L (ref 135–145)

## 2019-02-23 NOTE — Progress Notes (Signed)
PCP - [place]   Chest x-ray - not needed EKG - 07/05/18 Stress Test - not sure ECHO - 10/31/14 Cardiac Cath - 2007  Blood Thinner Instructions: last dose 02/23/19 Aspirin Instructions: starting aspirin on 9/17  COVID TEST- 9-18   Anesthesia review: yes, abnormal EKG  Patient denies shortness of breath, fever, cough and chest pain at PAT appointment   Patient verbalized understanding of instructions that were given to them at the PAT appointment. Patient was also instructed that they will need to review over the PAT instructions again at home before surgery.

## 2019-02-23 NOTE — Progress Notes (Signed)
[place], [place]. [place] [address] Phone: [phone] Fax: [phone]  [id number] - [place], [place] [id number] [place] [id number] Phone: [phone] Fax: [phone]      Your procedure is scheduled on September 22  Report to [facility] Main Entrance "A" at 0530 A.M., and check in at the Admitting office.  Call this number if you have problems the morning of surgery:  [phone]  Call [phone] if you have any questions prior to your surgery date Monday-Friday 8am-4pm    Remember:  Do not eat or drink after midnight the night before your surgery     Take these medicines the morning of surgery with A SIP OF WATER  acetaminophen (TYLENOL) if needed  albuterol (PROVENTIL HFA;VENTOLIN HFA)  Please bring all inhalers with you the day of surgery.  carvedilol (COREG) tiotropium (SPIRIVA)  Stop taking Birlinta 5 days prior to your surgery, when you stop taking Birlinta start taking Aspirin 81 mg per Cardiology.    7 days prior to surgery STOP taking any Aleve, Naproxen, Ibuprofen, Motrin, Advil, Goody's, BC's, all herbal medications, fish oil, and all vitamins.    The Morning of Surgery  Do not wear jewelry, make-up or nail polish.  Do not wear lotions, powders, or perfumes, or deodorant  Do not shave 48 hours prior to surgery.    Do not bring valuables to the hospital.  [facility] is not responsible for any belongings or valuables.  If you are a smoker, DO NOT Smoke 24 hours prior to surgery IF you wear a CPAP at night please bring your mask, tubing, and machine the morning of surgery   Remember that you must have someone to transport you home after your surgery, and remain with you for 24 hours if you are discharged the same day.   Contacts, glasses, hearing aids, dentures or bridgework may not be worn into  surgery.    Leave your suitcase in the car.  After surgery it may be brought to your room.  For patients admitted to the hospital, discharge time will be determined by your treatment team.  Patients discharged the day of surgery will not be allowed to drive home.    Special instructions:   [place]- Preparing For Surgery  Before surgery, you can play an important role. Because skin is not sterile, your skin needs to be as free of germs as possible. You can reduce the number of germs on your skin by washing with CHG (chlorahexidine gluconate) Soap before surgery.  CHG is an antiseptic cleaner which kills germs and bonds with the skin to continue killing germs even after washing.    Oral Hygiene is also important to reduce your risk of infection.  Remember - BRUSH YOUR TEETH THE MORNING OF SURGERY WITH YOUR REGULAR TOOTHPASTE  Please do not use if you have an allergy to CHG or antibacterial soaps. If your skin becomes reddened/irritated stop using the CHG.  Do not shave (including legs and underarms) for at least 48 hours prior to first CHG shower. It is OK to shave your face.  Please follow these instructions carefully.   1. Shower the NIGHT BEFORE SURGERY and the MORNING OF SURGERY with CHG Soap.   2. If you chose to wash your hair, wash your hair first as usual with your normal shampoo.  3. After you shampoo,  rinse your hair and body thoroughly to remove the shampoo.  4. Use CHG as you would any other liquid soap. You can apply CHG directly to the skin and wash gently with a scrungie or a clean washcloth.   5. Apply the CHG Soap to your body ONLY FROM THE NECK DOWN.  Do not use on open wounds or open sores. Avoid contact with your eyes, ears, mouth and genitals (private parts). Wash Face and genitals (private parts)  with your normal soap.   6. Wash thoroughly, paying special attention to the area where your surgery will be performed.  7. Thoroughly rinse your body with warm  water from the neck down.  8. DO NOT shower/wash with your normal soap after using and rinsing off the CHG Soap.  9. Pat yourself dry with a CLEAN TOWEL.  10. Wear CLEAN PAJAMAS to bed the night before surgery, wear comfortable clothes the morning of surgery  11. Place CLEAN SHEETS on your bed the night of your first shower and DO NOT SLEEP WITH PETS.    Day of Surgery:  Do not apply any deodorants/lotions. Please shower the morning of surgery with the CHG soap  Please wear clean clothes to the hospital/surgery center.   Remember to brush your teeth WITH YOUR REGULAR TOOTHPASTE.   Please read over the following fact sheets that you were given.

## 2019-02-24 NOTE — Anesthesia Preprocedure Evaluation (Addendum)
Anesthesia Evaluation  Patient identified by MRN, date of birth, ID band Patient awake    Reviewed: Allergy & Precautions, NPO status , Patient's Chart, lab work & pertinent test results  Airway Mallampati: II  TM Distance: >3 FB Neck ROM: Full    Dental no notable dental hx. (+) Upper Dentures, Dental Advisory Given   Pulmonary COPD,  COPD inhaler, former smoker,    Pulmonary exam normal breath sounds clear to auscultation       Cardiovascular Exercise Tolerance: Good hypertension, Pt. on medications and Pt. on home beta blockers + CAD and + Peripheral Vascular Disease  Normal cardiovascular exam Rhythm:Regular Rate:Normal     Neuro/Psych negative neurological ROS  negative psych ROS   GI/Hepatic Neg liver ROS,   Endo/Other  negative endocrine ROS  Renal/GU negative Renal ROS     Musculoskeletal   Abdominal (+) + obese,   Peds  Hematology  (+) anemia , Hgb 15.4   Anesthesia Other Findings   Reproductive/Obstetrics                           Anesthesia Physical Anesthesia Plan  ASA: III  Anesthesia Plan: General   Post-op Pain Management:    Induction: Intravenous  PONV Risk Score and Plan: 4 or greater and Treatment may vary due to age or medical condition, Ondansetron, Dexamethasone and Midazolam  Airway Management Planned: Oral ETT  Additional Equipment: None  Intra-op Plan:   Post-operative Plan: Extubation in OR  Informed Consent: I have reviewed the patients History and Physical, chart, labs and discussed the procedure including the risks, benefits and alternatives for the proposed anesthesia with the patient or authorized representative who has indicated his/her understanding and acceptance.     Dental advisory given  Plan Discussed with:   Anesthesia Plan Comments: (Follows with cardiologist Dr. Fletcher Anon for hx of right renal artery stenting (2008) with in-stent  restenosis treated with covered stent, bilateral carotid endarterectomies and right carotid to subclavian artery bypass, and bilateral iliac stenting. She also has chronic LBBB. Clearance per telephone encounter 02/01/19: "Brenda [name] was last seen on 10/19/18 by Dr. Fletcher Anon.  Since that day, Brenda [name] has done well. She can complete more than 4.0 METS without anginal symptoms. She denies changes in her cardiac history. Per Dr. Fletcher Anon, [place] 5 days before surgery. She should take aspirin 81 mg once daily instead until she resumesBrilinta. Therefore, based on ACC/AHA guidelines, the patient would be at acceptable risk for the planned procedure without further cardiovascular testing."  TTE 10/31/14: Left ventricle: The cavity size was normal. There was moderate   concentric hypertrophy. Systolic function was vigorous. The   estimated ejection fraction was in the range of 65% to 70%. There   was no dynamic obstruction. Wall motion was normal; there were no   regional wall motion abnormalities. Doppler parameters are   consistent with abnormal left ventricular relaxation (grade 1   diastolic dysfunction). )      Anesthesia Quick Evaluation

## 2019-02-25 ENCOUNTER — Other Ambulatory Visit (HOSPITAL_COMMUNITY)
Admission: RE | Admit: 2019-02-25 | Discharge: 2019-02-25 | Disposition: A | Discharge status: Home / Self Care | Payer: Medicare Other | Source: Ambulatory Visit | Attending: General Surgery | Admitting: General Surgery

## 2019-02-25 DIAGNOSIS — Z20828 Contact with and (suspected) exposure to other viral communicable diseases: Secondary | ICD-10-CM | POA: Insufficient documentation

## 2019-02-25 DIAGNOSIS — Z01812 Encounter for preprocedural laboratory examination: Secondary | ICD-10-CM | POA: Diagnosis present

## 2019-02-26 LAB — NOVEL CORONAVIRUS, NAA (HOSP ORDER, SEND-OUT TO REF LAB; TAT 18-24 HRS): SARS-CoV-2, NAA: NOT DETECTED

## 2019-03-01 ENCOUNTER — Ambulatory Visit: Payer: Medicare Other | Admitting: Cardiovascular Disease

## 2019-03-01 ENCOUNTER — Ambulatory Visit (HOSPITAL_COMMUNITY): Payer: Medicare Other | Admitting: Vascular Surgery

## 2019-03-01 ENCOUNTER — Encounter (HOSPITAL_COMMUNITY): Payer: Self-pay

## 2019-03-01 ENCOUNTER — Encounter (HOSPITAL_COMMUNITY)
Admission: RE | Disposition: A | Discharge status: Home / Self Care | Payer: Self-pay | Source: Home / Self Care | Attending: General Surgery

## 2019-03-01 ENCOUNTER — Ambulatory Visit (HOSPITAL_COMMUNITY)
Admission: RE | Admit: 2019-03-01 | Discharge: 2019-03-01 | Disposition: A | Discharge status: Home / Self Care | Payer: Medicare Other | Attending: General Surgery | Admitting: General Surgery

## 2019-03-01 ENCOUNTER — Ambulatory Visit (HOSPITAL_COMMUNITY): Payer: Medicare Other | Admitting: Certified Registered Nurse Anesthetist

## 2019-03-01 DIAGNOSIS — J449 Chronic obstructive pulmonary disease, unspecified: Secondary | ICD-10-CM | POA: Insufficient documentation

## 2019-03-01 DIAGNOSIS — Z9981 Dependence on supplemental oxygen: Secondary | ICD-10-CM | POA: Diagnosis not present

## 2019-03-01 DIAGNOSIS — I110 Hypertensive heart disease with heart failure: Secondary | ICD-10-CM | POA: Insufficient documentation

## 2019-03-01 DIAGNOSIS — Z7902 Long term (current) use of antithrombotics/antiplatelets: Secondary | ICD-10-CM | POA: Diagnosis not present

## 2019-03-01 DIAGNOSIS — F17200 Nicotine dependence, unspecified, uncomplicated: Secondary | ICD-10-CM | POA: Insufficient documentation

## 2019-03-01 DIAGNOSIS — K8020 Calculus of gallbladder without cholecystitis without obstruction: Secondary | ICD-10-CM | POA: Diagnosis present

## 2019-03-01 DIAGNOSIS — I739 Peripheral vascular disease, unspecified: Secondary | ICD-10-CM | POA: Diagnosis not present

## 2019-03-01 DIAGNOSIS — Z79899 Other long term (current) drug therapy: Secondary | ICD-10-CM | POA: Insufficient documentation

## 2019-03-01 DIAGNOSIS — I509 Heart failure, unspecified: Secondary | ICD-10-CM | POA: Insufficient documentation

## 2019-03-01 DIAGNOSIS — I2510 Atherosclerotic heart disease of native coronary artery without angina pectoris: Secondary | ICD-10-CM | POA: Diagnosis not present

## 2019-03-01 DIAGNOSIS — Z79891 Long term (current) use of opiate analgesic: Secondary | ICD-10-CM | POA: Diagnosis not present

## 2019-03-01 DIAGNOSIS — K8010 Calculus of gallbladder with chronic cholecystitis without obstruction: Secondary | ICD-10-CM | POA: Diagnosis not present

## 2019-03-01 HISTORY — PX: CHOLECYSTECTOMY: SHX55

## 2019-03-01 LAB — PROTIME-INR
INR: 1.1 (ref 0.8–1.2)
Prothrombin Time: 13.9 seconds (ref 11.4–15.2)

## 2019-03-01 SURGERY — LAPAROSCOPIC CHOLECYSTECTOMY
Anesthesia: General

## 2019-03-01 MED ORDER — CEFAZOLIN SODIUM-DEXTROSE 2-4 GM/100ML-% IV SOLN
2.0000 g | INTRAVENOUS | Status: AC
  Administered 2019-03-01: 2 g via INTRAVENOUS
  Filled 2019-03-01: qty 100

## 2019-03-01 MED ORDER — ACETAMINOPHEN 10 MG/ML IV SOLN: 1000.0000 mg | Freq: Once | INTRAVENOUS | Status: DC | PRN

## 2019-03-01 MED ORDER — PROPOFOL 10 MG/ML IV BOLUS
INTRAVENOUS | Status: DC | PRN
  Administered 2019-03-01: 110 mg via INTRAVENOUS

## 2019-03-01 MED ORDER — HYDROCODONE-ACETAMINOPHEN 7.5-325 MG PO TABS: 1.0000 | ORAL_TABLET | Freq: Once | ORAL | Status: DC | PRN

## 2019-03-01 MED ORDER — BUPIVACAINE-EPINEPHRINE (PF) 0.5% -1:200000 IJ SOLN
INTRAMUSCULAR | Status: AC
  Filled 2019-03-01: qty 30

## 2019-03-01 MED ORDER — ONDANSETRON HCL 4 MG/2ML IJ SOLN
INTRAMUSCULAR | Status: DC | PRN
  Administered 2019-03-01: 4 mg via INTRAVENOUS

## 2019-03-01 MED ORDER — PHENYLEPHRINE 40 MCG/ML (10ML) SYRINGE FOR IV PUSH (FOR BLOOD PRESSURE SUPPORT)
PREFILLED_SYRINGE | INTRAVENOUS | Status: AC
  Filled 2019-03-01: qty 10

## 2019-03-01 MED ORDER — BUPIVACAINE HCL (PF) 0.25 % IJ SOLN
INTRAMUSCULAR | Status: AC
  Filled 2019-03-01: qty 30

## 2019-03-01 MED ORDER — EPHEDRINE 5 MG/ML INJ
INTRAVENOUS | Status: AC
  Filled 2019-03-01: qty 10

## 2019-03-01 MED ORDER — CHLORHEXIDINE GLUCONATE CLOTH 2 % EX PADS: 6.0000 | MEDICATED_PAD | Freq: Once | CUTANEOUS | Status: DC

## 2019-03-01 MED ORDER — FENTANYL CITRATE (PF) 250 MCG/5ML IJ SOLN
INTRAMUSCULAR | Status: DC | PRN
  Administered 2019-03-01 (×2): 50 ug via INTRAVENOUS
  Administered 2019-03-01: 100 ug via INTRAVENOUS

## 2019-03-01 MED ORDER — MEPERIDINE HCL 25 MG/ML IJ SOLN: 6.2500 mg | INTRAMUSCULAR | Status: DC | PRN

## 2019-03-01 MED ORDER — BUPIVACAINE HCL 0.25 % IJ SOLN
INTRAMUSCULAR | Status: DC | PRN
  Administered 2019-03-01: 10 mL

## 2019-03-01 MED ORDER — PHENYLEPHRINE 40 MCG/ML (10ML) SYRINGE FOR IV PUSH (FOR BLOOD PRESSURE SUPPORT)
PREFILLED_SYRINGE | INTRAVENOUS | Status: DC | PRN
  Administered 2019-03-01: 160 ug via INTRAVENOUS
  Administered 2019-03-01: 80 ug via INTRAVENOUS
  Administered 2019-03-01: 160 ug via INTRAVENOUS

## 2019-03-01 MED ORDER — ONDANSETRON HCL 4 MG/2ML IJ SOLN: 4.0000 mg | Freq: Once | INTRAMUSCULAR | Status: DC | PRN

## 2019-03-01 MED ORDER — FENTANYL CITRATE (PF) 250 MCG/5ML IJ SOLN
INTRAMUSCULAR | Status: AC
  Filled 2019-03-01: qty 5

## 2019-03-01 MED ORDER — ACETAMINOPHEN 500 MG PO TABS
1000.0000 mg | ORAL_TABLET | ORAL | Status: AC
  Administered 2019-03-01: 1000 mg via ORAL
  Filled 2019-03-01: qty 2

## 2019-03-01 MED ORDER — EPHEDRINE SULFATE-NACL 50-0.9 MG/10ML-% IV SOSY
PREFILLED_SYRINGE | INTRAVENOUS | Status: DC | PRN
  Administered 2019-03-01 (×2): 10 mg via INTRAVENOUS

## 2019-03-01 MED ORDER — LIDOCAINE 2% (20 MG/ML) 5 ML SYRINGE
INTRAMUSCULAR | Status: DC | PRN
  Administered 2019-03-01: 100 mg via INTRAVENOUS

## 2019-03-01 MED ORDER — CELECOXIB 200 MG PO CAPS
200.0000 mg | ORAL_CAPSULE | ORAL | Status: AC
  Administered 2019-03-01: 200 mg via ORAL
  Filled 2019-03-01: qty 1

## 2019-03-01 MED ORDER — SUGAMMADEX SODIUM 200 MG/2ML IV SOLN
INTRAVENOUS | Status: DC | PRN
  Administered 2019-03-01: 200 mg via INTRAVENOUS

## 2019-03-01 MED ORDER — GABAPENTIN 300 MG PO CAPS
300.0000 mg | ORAL_CAPSULE | ORAL | Status: AC
  Administered 2019-03-01: 300 mg via ORAL
  Filled 2019-03-01: qty 1

## 2019-03-01 MED ORDER — TRAMADOL HCL 50 MG PO TABS: 50.0000 mg | ORAL_TABLET | Freq: Four times a day (QID) | ORAL | 0 refills | Status: DC | PRN

## 2019-03-01 MED ORDER — 0.9 % SODIUM CHLORIDE (POUR BTL) OPTIME
TOPICAL | Status: DC | PRN
  Administered 2019-03-01: 1000 mL

## 2019-03-01 MED ORDER — HYDROMORPHONE HCL 1 MG/ML IJ SOLN
INTRAMUSCULAR | Status: AC
  Filled 2019-03-01: qty 1

## 2019-03-01 MED ORDER — DEXAMETHASONE SODIUM PHOSPHATE 10 MG/ML IJ SOLN
INTRAMUSCULAR | Status: AC
  Filled 2019-03-01: qty 1

## 2019-03-01 MED ORDER — ROCURONIUM BROMIDE 10 MG/ML (PF) SYRINGE
PREFILLED_SYRINGE | INTRAVENOUS | Status: DC | PRN
  Administered 2019-03-01: 50 mg via INTRAVENOUS

## 2019-03-01 MED ORDER — PROPOFOL 10 MG/ML IV BOLUS
INTRAVENOUS | Status: AC
  Filled 2019-03-01: qty 40

## 2019-03-01 MED ORDER — SODIUM CHLORIDE 0.9 % IR SOLN
Status: DC | PRN
  Administered 2019-03-01: 1000 mL

## 2019-03-01 MED ORDER — MIDAZOLAM HCL 2 MG/2ML IJ SOLN
INTRAMUSCULAR | Status: AC
  Filled 2019-03-01: qty 2

## 2019-03-01 MED ORDER — ONDANSETRON HCL 4 MG/2ML IJ SOLN
INTRAMUSCULAR | Status: AC
  Filled 2019-03-01: qty 2

## 2019-03-01 MED ORDER — MIDAZOLAM HCL 5 MG/5ML IJ SOLN
INTRAMUSCULAR | Status: DC | PRN
  Administered 2019-03-01: 2 mg via INTRAVENOUS

## 2019-03-01 MED ORDER — HYDROMORPHONE HCL 1 MG/ML IJ SOLN
0.2500 mg | INTRAMUSCULAR | Status: DC | PRN
  Administered 2019-03-01: 10:00:00 0.5 mg via INTRAVENOUS

## 2019-03-01 MED ORDER — LACTATED RINGERS IV SOLN
INTRAVENOUS | Status: DC | PRN
  Administered 2019-03-01: 07:00:00 via INTRAVENOUS

## 2019-03-01 SURGICAL SUPPLY — 43 items
ADH SKN CLS APL DERMABOND .7 (GAUZE/BANDAGES/DRESSINGS) ×1
ADH SKN CLS LQ APL DERMABOND (GAUZE/BANDAGES/DRESSINGS) ×1
APL PRP STRL LF DISP 70% ISPRP (MISCELLANEOUS) ×1
BAG SPEC RTRVL 10 TROC 200 (ENDOMECHANICALS)
CANISTER SUCT 3000ML PPV (MISCELLANEOUS) ×2 IMPLANT
CHLORAPREP W/TINT 26 (MISCELLANEOUS) ×2 IMPLANT
CLIP VESOLOCK MED LG 6/CT (CLIP) ×2 IMPLANT
COVER SURGICAL LIGHT HANDLE (MISCELLANEOUS) ×2 IMPLANT
COVER TRANSDUCER ULTRASND (DRAPES) ×2 IMPLANT
COVER WAND RF STERILE (DRAPES) ×2 IMPLANT
DEFOGGER SCOPE WARMER CLEARIFY (MISCELLANEOUS) IMPLANT
DERMABOND ADHESIVE PROPEN (GAUZE/BANDAGES/DRESSINGS) ×1
DERMABOND ADVANCED (GAUZE/BANDAGES/DRESSINGS) ×1
DERMABOND ADVANCED .7 DNX12 (GAUZE/BANDAGES/DRESSINGS) ×1 IMPLANT
DERMABOND ADVANCED .7 DNX6 (GAUZE/BANDAGES/DRESSINGS) ×1 IMPLANT
ELECT REM PT RETURN 9FT ADLT (ELECTROSURGICAL) ×2
ELECTRODE REM PT RTRN 9FT ADLT (ELECTROSURGICAL) ×1 IMPLANT
GLOVE BIO SURGEON STRL SZ7.5 (GLOVE) ×2 IMPLANT
GOWN STRL REUS W/ TWL LRG LVL3 (GOWN DISPOSABLE) ×2 IMPLANT
GOWN STRL REUS W/ TWL XL LVL3 (GOWN DISPOSABLE) ×1 IMPLANT
GOWN STRL REUS W/TWL LRG LVL3 (GOWN DISPOSABLE) ×4
GOWN STRL REUS W/TWL XL LVL3 (GOWN DISPOSABLE) ×2
GRASPER SUT TROCAR 14GX15 (MISCELLANEOUS) ×2 IMPLANT
KIT BASIN OR (CUSTOM PROCEDURE TRAY) ×2 IMPLANT
KIT TURNOVER KIT B (KITS) ×2 IMPLANT
NEEDLE INSUFFLATION 14GA 120MM (NEEDLE) ×2 IMPLANT
NS IRRIG 1000ML POUR BTL (IV SOLUTION) ×2 IMPLANT
PAD ARMBOARD 7.5X6 YLW CONV (MISCELLANEOUS) ×2 IMPLANT
POUCH LAPAROSCOPIC INSTRUMENT (MISCELLANEOUS) ×2 IMPLANT
POUCH RETRIEVAL ECOSAC 10 (ENDOMECHANICALS) IMPLANT
POUCH RETRIEVAL ECOSAC 10MM (ENDOMECHANICALS)
SCISSORS LAP 5X35 DISP (ENDOMECHANICALS) ×2 IMPLANT
SET IRRIG TUBING LAPAROSCOPIC (IRRIGATION / IRRIGATOR) ×2 IMPLANT
SET TUBE SMOKE EVAC HIGH FLOW (TUBING) ×2 IMPLANT
SLEEVE ENDOPATH XCEL 5M (ENDOMECHANICALS) ×2 IMPLANT
SPECIMEN JAR SMALL (MISCELLANEOUS) ×2 IMPLANT
SUT MNCRL AB 4-0 PS2 18 (SUTURE) ×2 IMPLANT
TOWEL GREEN STERILE (TOWEL DISPOSABLE) ×2 IMPLANT
TOWEL GREEN STERILE FF (TOWEL DISPOSABLE) ×2 IMPLANT
TRAY LAPAROSCOPIC MC (CUSTOM PROCEDURE TRAY) ×2 IMPLANT
TROCAR XCEL NON-BLD 11X100MML (ENDOMECHANICALS) ×2 IMPLANT
TROCAR XCEL NON-BLD 5MMX100MML (ENDOMECHANICALS) ×2 IMPLANT
WATER STERILE IRR 1000ML POUR (IV SOLUTION) ×2 IMPLANT

## 2019-03-01 NOTE — Transfer of Care (Signed)
Immediate Anesthesia Transfer of Care Note  Patient: VALOIS PALMITER  Procedure(s) Performed: LAPAROSCOPIC CHOLECYSTECTOMY (N/A )  Patient Location: PACU  Anesthesia Type:General  Level of Consciousness: drowsy  Airway & Oxygen Therapy: Patient Spontanous Breathing and Patient connected to face mask oxygen  Post-op Assessment: Report given to RN and Post -op Vital signs reviewed and stable  Post vital signs: Reviewed and stable  Last Vitals:  Vitals Value Taken Time  BP 142/67 03/01/19 0835  Temp    Pulse 66 03/01/19 0836  Resp 12 03/01/19 0836  SpO2 95 % 03/01/19 0836  Vitals shown include unvalidated device data.  Last Pain:  Vitals:   03/01/19 0636  TempSrc:   PainSc: 0-No pain         Complications: No apparent anesthesia complications

## 2019-03-01 NOTE — Anesthesia Procedure Notes (Signed)
Procedure Name: Intubation Date/Time: 03/01/2019 7:37 AM Performed by: Colin Benton, CRNA Pre-anesthesia Checklist: Emergency Drugs available, Patient identified, Suction available and Patient being monitored Patient Re-evaluated:Patient Re-evaluated prior to induction Oxygen Delivery Method: Circle system utilized Preoxygenation: Pre-oxygenation with 100% oxygen Induction Type: IV induction Ventilation: Mask ventilation without difficulty Laryngoscope Size: Miller and 2 Grade View: Grade I Tube type: Oral Tube size: 7.0 mm Number of attempts: 1 Airway Equipment and Method: Stylet Placement Confirmation: ETT inserted through vocal cords under direct vision,  positive ETCO2 and breath sounds checked- equal and bilateral Secured at: 22 cm Tube secured with: Tape Dental Injury: Teeth and Oropharynx as per pre-operative assessment

## 2019-03-01 NOTE — Op Note (Signed)
03/01/2019  8:16 AM  PATIENT:  Brenda [name]  58 y.o. female  PRE-OPERATIVE DIAGNOSIS:  GALLSTONES  POST-OPERATIVE DIAGNOSIS:  CHRONIC CHOLECYSTITS, GALLSTONES  PROCEDURE:  Procedure(s): LAPAROSCOPIC CHOLECYSTECTOMY (N/A)  SURGEON:  Surgeon(s) and Role:    Ralene Ok, MD - Primary  ANESTHESIA:   local and general  EBL:  minimal   BLOOD ADMINISTERED:none  DRAINS: none   LOCAL MEDICATIONS USED:  BUPIVICAINE   SPECIMEN:  Source of Specimen:  GALLBLADDER  DISPOSITION OF SPECIMEN:  PATHOLOGY  COUNTS:  YES  TOURNIQUET:  * No tourniquets in log *  DICTATION: .Dragon Dictation  EBL: [id number]   Complications: none   Counts: reported as correct x 2   Findings: chronic inflammation of the gallbladder  Indications for procedure: Pt is a 49F with RUQ pain and seen to have gallstones.   Details of the procedure: The patient was taken to the operating and placed in the supine position with bilateral SCDs in place. A time out was called and all facts were verified. A pneumoperitoneum was obtained via A Veress needle technique to a pressure of 26mm of mercury. A 86mm trochar was then placed in the right upper quadrant under visualization, and there were no injuries to any abdominal organs. A 11 mm port was then placed in the umbilical region after infiltrating with local anesthesia under direct visualization. A second epigastric port was placed under direct visualization.   The gallbladder was identified and retracted, the peritoneum was then sharply dissected from the gallbladder and this dissection was carried down to Calot's triangle. The cystic duct was identified and dissected circumferentially and seen going into the gallbladder 360.  The cystic artery was dissected away from the surrounding tissues.   The critical angle was obtained.  3 clips were placed proximally one distally and the cystic duct transected. The cystic artery was identified and 2 clips placed proximally  and one distally and transected. We then proceeded to remove the gallbladder off the hepatic fossa with Bovie cautery. A retrieval bag was then placed in the abdomen and gallbladder placed in the bag. The hepatic fossa was then reexamined and hemostasis was achieved with Bovie cautery and was excellent at this portion of the case. The subhepatic fossa and perihepatic fossa was then irrigated until the effluent was clear. The specimen bag and specimen were removed from the abdominal cavity.  The 11 mm trocar fascia was reapproximated with the Endo Close #1 Vicryl x2. The pneumoperitoneum was evacuated and all trochars removed under direct visulalization. The skin was then closed with 4-0 Monocryl and the skin dressed with Dermabond. The patient was awaken from general anesthesia and taken to the recovery room in stable condition.    PLAN OF CARE: Discharge to home after PACU  PATIENT DISPOSITION:  PACU - hemodynamically stable.   Delay start of Pharmacological VTE agent (>24hrs) due to surgical blood loss or risk of bleeding: not applicable

## 2019-03-01 NOTE — Discharge Instructions (Signed)
[facility] ______[facility] °LAPAROSCOPIC SURGERY: POST OP INSTRUCTIONS °Always review your discharge instruction sheet given to you by the facility where your surgery was performed. °IF YOU HAVE DISABILITY OR FAMILY LEAVE FORMS, YOU MUST BRING THEM TO THE OFFICE FOR PROCESSING.   °DO NOT GIVE THEM TO YOUR DOCTOR. ° °1. A prescription for pain medication may be given to you upon discharge.  Take your pain medication as prescribed, if needed.  If narcotic pain medicine is not needed, then you may take acetaminophen (Tylenol) or ibuprofen (Advil) as needed. °2. Take your usually prescribed medications unless otherwise directed. °3. If you need a refill on your pain medication, please contact your pharmacy.  They will contact our office to request authorization. Prescriptions will not be filled after 5pm or on week-ends. °4. You should follow a light diet the first few days after arrival home, such as soup and crackers, etc.  Be sure to include lots of fluids daily. °5. Most patients will experience some swelling and bruising in the area of the incisions.  Ice packs will help.  Swelling and bruising can take several days to resolve.  °6. It is common to experience some constipation if taking pain medication after surgery.  Increasing fluid intake and taking a stool softener (such as Colace) will usually help or prevent this problem from occurring.  A mild laxative (Milk of Magnesia or Miralax) should be taken according to package instructions if there are no bowel movements after 48 hours. °7. Unless discharge instructions indicate otherwise, you may remove your bandages 24-48 hours after surgery, and you may shower at that time.  You may have steri-strips (small skin tapes) in place directly over the incision.  These strips should be left on the skin for 7-10 days.  If your surgeon used skin glue on the incision, you may shower in 24 hours.  The glue will flake off over the next 2-3 weeks.  Any sutures or  staples will be removed at the office during your follow-up visit. °8. ACTIVITIES:  You may resume regular (light) daily activities beginning the next day--such as daily self-care, walking, climbing stairs--gradually increasing activities as tolerated.  You may have sexual intercourse when it is comfortable.  Refrain from any heavy lifting or straining until approved by your doctor. °a. You may drive when you are no longer taking prescription pain medication, you can comfortably wear a seatbelt, and you can safely maneuver your car and apply brakes. °b. RETURN TO WORK:  __________________________________________________________ °9. You should see your doctor in the office for a follow-up appointment approximately 2-3 weeks after your surgery.  Make sure that you call for this appointment within a day or two after you arrive home to insure a convenient appointment time. °10. OTHER INSTRUCTIONS: __________________________________________________________________________________________________________________________ __________________________________________________________________________________________________________________________ °WHEN TO CALL YOUR DOCTOR: °1. Fever over 101.0 °2. Inability to urinate °3. Continued bleeding from incision. °4. Increased pain, redness, or drainage from the incision. °5. Increasing abdominal pain ° °The clinic staff is available to answer your questions during regular business hours.  Please don’t hesitate to call and ask to speak to one of the nurses for clinical concerns.  If you have a medical emergency, go to the nearest emergency room or call 911.  A surgeon from [facility] is always on call at the hospital. °[address], [address], [place], [place]  [address] ? [address] [address], [place], [place]   [address] °[phone] ? [phone] ? FAX [phone] °Web site:   [email or web address] °

## 2019-03-01 NOTE — H&P (Signed)
History of Present IllnessThe patient is a 58 year old female who presents for evaluation of gall stones. Referred by: Dr. Aletta Edouard Chief Complaint: Abdominal pain  Patient is a 58 year old female with a history of hypercholesterolemia, hypertension, CAD, carotid artery occlusion, COPD, with a history of abdominal pain. Patient was recently in the ER last week secondary to right-sided abdominal pain. Patient does she was passing a kidney stone. Patient underwent imaging at been on antibiotics for approximately 3-4 days. Her initial visit with her PCP, was thought to be kidney infection. Upon evaluation ER she would laboratory studies and ultrasound. Ultrasound we'll send the skin for gallstones. I did review this personally. Her laboratory studies revealed LfT within normal limits.  Patient sees Dr. Burt Knack her cardiologist. Patient is on [place].    Past Surgical History No pertinent past surgical history   Diagnostic Studies History Colonoscopy  5-10 years ago Mammogram  1-3 years ago Pap Smear  1-5 years ago  Allergies Lisinopril *ANTIHYPERTENSIVES*  Allergies Reconciled   Medication History Ondansetron HCl (4MG  Tablet, Oral) Active. oxyCODONE-Acetaminophen (5-325MG  Tablet, Oral) Active. Carvedilol (6.25MG  Tablet, Oral) Active. Candesartan Cilexetil (16MG  Tablet, Oral) Active. Simvastatin (40MG  Tablet, Oral) Active. Brilinta (60MG  Tablet, Oral) Active. Spiriva HandiHaler (18MCG Capsule, Inhalation) Active. Medications Reconciled  Social History  Caffeine use  Carbonated beverages. No drug use  Tobacco use  Current every day smoker.  Family History  Breast Cancer  Sister. Cancer  Father. Heart Disease  Mother. Heart disease in female family member before age 58  Hypertension  Mother.  Pregnancy / Birth History  Age at menarche  58 years. Age of menopause  78-50 Gravida  2 Maternal age  67-20 Para  2 Regular  periods   Other Problems  Arthritis  Cholelithiasis  Congestive Heart Failure  Heart murmur  High blood pressure  Home Oxygen Use  Kidney Stone  Vascular Disease     Review of Systems  General Not Present- Appetite Loss, Chills, Fatigue, Fever, Night Sweats, Weight Gain and Weight Loss. Skin Not Present- Change in Wart/Mole, Dryness, Hives, Jaundice, New Lesions, Non-Healing Wounds, Rash and Ulcer. HEENT Present- Hearing Loss. Not Present- Earache, Hoarseness, Nose Bleed, Oral Ulcers, Ringing in the Ears, Seasonal Allergies, Sinus Pain, Sore Throat, Visual Disturbances, Wears glasses/contact lenses and Yellow Eyes. Respiratory Not Present- Cough and Difficulty Breathing. Cardiovascular Not Present- Chest Pain, Difficulty Breathing Lying Down, Leg Cramps, Palpitations, Rapid Heart Rate, Shortness of Breath and Swelling of Extremities. Gastrointestinal Present- Abdominal Pain. Not Present- Bloating, Bloody Stool, Change in Bowel Habits, Chronic diarrhea, Constipation, Difficulty Swallowing, Excessive gas, Gets full quickly at meals, Hemorrhoids, Indigestion, Nausea, Rectal Pain and Vomiting. Female Genitourinary Not Present- Frequency, Nocturia, Painful Urination, Pelvic Pain and Urgency. Musculoskeletal Not Present- Back Pain, Joint Pain, Joint Stiffness, Muscle Pain, Muscle Weakness and Swelling of Extremities. Neurological Present- Trouble walking. Not Present- Decreased Memory, Fainting, Headaches, Numbness, Seizures, Tingling, Tremor and Weakness. Psychiatric Not Present- Anxiety, Bipolar, Change in Sleep Pattern, Depression, Fearful and Frequent crying. Endocrine Not Present- Cold Intolerance, Excessive Hunger, Hair Changes, Heat Intolerance, Hot flashes and New Diabetes. Hematology Present- Blood Thinners and Easy Bruising. Not Present- Excessive bleeding, Gland problems, HIV and Persistent Infections. All other systems negative BP (!) 150/89   Pulse 74   Temp 98 F  (36.7 C) (Oral)   Resp 20   Ht 5' 3.5" (1.613 m)   Wt 83.2 kg   SpO2 95%   BMI 31.98 kg/m      Physical Exam The physical  exam findings are as follows: Note: Constitutional: No acute distress, conversant, appears stated age  Eyes: Anicteric sclerae, moist conjunctiva, no lid lag  Neck: No thyromegaly, trachea midline, no cervical lymphadenopathy  Lungs: Clear to auscultation biilaterally, normal respiratory effot  Cardiovascular: regular rate & rhythm, no murmurs, no peripheal edema, pedal pulses 2+  GI: Soft, no masses or hepatosplenomegaly, non-tender to palpation  MSK: Normal gait, no clubbing cyanosis, edema  Skin: No rashes, palpation reveals normal skin turgor  Psychiatric: Appropriate judgment and insight, oriented to person, place, and time    Assessment & Plan   SYMPTOMATIC CHOLELITHIASIS (K80.20) Impression: Patient is a 58 year old female with symptomatic cholelithiasis. Patient has a long history of hypercholesterolemia, hypertension, CAD, carotid artery occlusion, COPD. Patient currently is on Brilinta.  1. Will obtain cardiac clearance from Dr. Burt Knack prior to scheduling surgery. 2 We will proceed to the operating room for a laparoscopic cholecystectomy  3. Risks and benefits were discussed with the patient to generally include, but not limited to: infection, bleeding, possible need for post op ERCP, damage to the bile ducts, bile leak, and possible need for further surgery. Alternatives were offered and described. All questions were answered and the patient voiced understanding of the procedure and wishes to proceed at this point with a laparoscopic cholecystectomy

## 2019-03-01 NOTE — Anesthesia Postprocedure Evaluation (Signed)
Anesthesia Post Note  Patient: Brenda [name]  Procedure(s) Performed: LAPAROSCOPIC CHOLECYSTECTOMY (N/A )     Patient location during evaluation: PACU Anesthesia Type: General Level of consciousness: awake and alert Pain management: pain level controlled Vital Signs Assessment: post-procedure vital signs reviewed and stable Respiratory status: spontaneous breathing, nonlabored ventilation, respiratory function stable and patient connected to nasal cannula oxygen Cardiovascular status: blood pressure returned to baseline and stable Postop Assessment: no apparent nausea or vomiting Anesthetic complications: no    Last Vitals:  Vitals:   03/01/19 1110 03/01/19 1115  BP:  120/72  Pulse:  72  Resp:  15  Temp:    SpO2: 94% 94%    Last Pain:  Vitals:   03/01/19 1115  TempSrc:   PainSc: 0-No pain                 Barnet Glasgow

## 2019-03-02 ENCOUNTER — Encounter (HOSPITAL_COMMUNITY): Payer: Self-pay | Admitting: General Surgery

## 2019-03-02 LAB — SURGICAL PATHOLOGY

## 2019-03-08 ENCOUNTER — Ambulatory Visit (HOSPITAL_COMMUNITY): Payer: Medicare Other

## 2019-03-08 ENCOUNTER — Ambulatory Visit (HOSPITAL_COMMUNITY)
Admission: RE | Admit: 2019-03-08 | Payer: Medicare Other | Source: Ambulatory Visit | Attending: Cardiovascular Disease | Admitting: Cardiovascular Disease

## 2019-03-18 ENCOUNTER — Encounter (HOSPITAL_COMMUNITY): Payer: Medicare Other

## 2019-03-25 ENCOUNTER — Encounter (HOSPITAL_COMMUNITY): Payer: Medicare Other

## 2019-03-25 ENCOUNTER — Ambulatory Visit (HOSPITAL_COMMUNITY): Payer: Medicare Other

## 2019-03-29 ENCOUNTER — Other Ambulatory Visit: Payer: Self-pay | Admitting: Cardiovascular Disease

## 2019-03-29 NOTE — Telephone Encounter (Signed)
Please review for refill.  

## 2019-04-06 ENCOUNTER — Ambulatory Visit (HOSPITAL_BASED_OUTPATIENT_CLINIC_OR_DEPARTMENT_OTHER)
Admission: RE | Admit: 2019-04-06 | Discharge: 2019-04-06 | Disposition: A | Discharge status: Home / Self Care | Payer: Medicare Other | Source: Ambulatory Visit | Attending: Cardiovascular Disease | Admitting: Cardiovascular Disease

## 2019-04-06 ENCOUNTER — Other Ambulatory Visit (HOSPITAL_COMMUNITY): Payer: Self-pay | Admitting: Cardiovascular Disease

## 2019-04-06 ENCOUNTER — Ambulatory Visit (HOSPITAL_BASED_OUTPATIENT_CLINIC_OR_DEPARTMENT_OTHER)
Admission: RE | Admit: 2019-04-06 | Discharge: 2019-04-06 | Disposition: A | Discharge status: Home / Self Care | Payer: Medicare Other | Source: Ambulatory Visit | Attending: Cardiology | Admitting: Cardiology

## 2019-04-06 ENCOUNTER — Other Ambulatory Visit: Payer: Self-pay

## 2019-04-06 ENCOUNTER — Ambulatory Visit (HOSPITAL_COMMUNITY)
Admission: RE | Admit: 2019-04-06 | Discharge: 2019-04-06 | Disposition: A | Discharge status: Home / Self Care | Payer: Medicare Other | Source: Ambulatory Visit | Attending: Cardiology | Admitting: Cardiology

## 2019-04-06 DIAGNOSIS — Z9889 Other specified postprocedural states: Secondary | ICD-10-CM

## 2019-04-06 DIAGNOSIS — I700 Atherosclerosis of aorta: Secondary | ICD-10-CM | POA: Insufficient documentation

## 2019-04-06 DIAGNOSIS — I708 Atherosclerosis of other arteries: Secondary | ICD-10-CM | POA: Insufficient documentation

## 2019-04-06 DIAGNOSIS — I6523 Occlusion and stenosis of bilateral carotid arteries: Secondary | ICD-10-CM

## 2019-04-06 DIAGNOSIS — I739 Peripheral vascular disease, unspecified: Secondary | ICD-10-CM | POA: Insufficient documentation

## 2019-04-12 ENCOUNTER — Encounter: Payer: Self-pay | Admitting: Gastroenterology

## 2019-04-13 ENCOUNTER — Encounter: Payer: Self-pay | Admitting: Gastroenterology

## 2019-04-19 ENCOUNTER — Ambulatory Visit: Payer: Medicare Other | Admitting: Cardiovascular Disease

## 2019-04-19 ENCOUNTER — Other Ambulatory Visit: Payer: Self-pay

## 2019-04-19 VITALS — BP 146/100 | HR 64 | Ht 64.0 in | Wt 184.0 lb

## 2019-04-19 DIAGNOSIS — E785 Hyperlipidemia, unspecified: Secondary | ICD-10-CM | POA: Diagnosis not present

## 2019-04-19 DIAGNOSIS — I10 Essential (primary) hypertension: Secondary | ICD-10-CM

## 2019-04-19 DIAGNOSIS — I739 Peripheral vascular disease, unspecified: Secondary | ICD-10-CM

## 2019-04-19 DIAGNOSIS — I779 Disorder of arteries and arterioles, unspecified: Secondary | ICD-10-CM

## 2019-04-19 LAB — BASIC METABOLIC PANEL
BUN/Creatinine Ratio: 7 — ABNORMAL LOW (ref 9–23)
BUN: 6 mg/dL (ref 6–24)
CO2: 24 mmol/L (ref 20–29)
Calcium: 9.8 mg/dL (ref 8.7–10.2)
Chloride: 104 mmol/L (ref 96–106)
Creatinine, Ser: 0.86 mg/dL (ref 0.57–1.00)
GFR calc Af Amer: 87 mL/min/{1.73_m2} (ref >59)
GFR calc non Af Amer: 75 mL/min/{1.73_m2} (ref >59)
Glucose: 94 mg/dL (ref 65–99)
Potassium: 4.2 mmol/L (ref 3.5–5.2)
Sodium: 141 mmol/L (ref 134–144)

## 2019-04-19 LAB — CBC
Hematocrit: 45.7 % (ref 34.0–46.6)
Hemoglobin: 15.3 g/dL (ref 11.1–15.9)
MCH: 30.5 pg (ref 26.6–33.0)
MCHC: 33.5 g/dL (ref 31.5–35.7)
MCV: 91 fL (ref 79–97)
Platelets: 321 10*3/uL (ref 150–450)
RBC: 5.01 x10E6/uL (ref 3.77–5.28)
RDW: 11.6 % — ABNORMAL LOW (ref 11.7–15.4)
WBC: 8.3 10*3/uL (ref 3.4–10.8)

## 2019-04-19 MED ORDER — SIMVASTATIN 40 MG PO TABS: 40.0000 mg | ORAL_TABLET | Freq: Every day | ORAL | 3 refills | Status: DC

## 2019-04-19 NOTE — H&P (View-Only) (Signed)
Cardiology Office Note   Date:  04/19/2019   ID:  Brenda [name], DOB [birth year], MRN [id number]  PCP:  [facility]  Cardiologist:  Dr. Burt Knack  No chief complaint on file.     History of Present Illness: Brenda [name] is a 58 y.o. female who is here today for follow-up regarding peripheral arterial disease. She has multiple medical problems including coronary artery disease, chronic systolic heart failure with subsequent improvement in ejection fraction, COPD, previous tobacco use, hypertension, hyperlipidemia and extensive peripheral arterial disease. She had previous right renal artery stenting in [id number] which was complicated by wire induced perforation that was treated with coils.  She had in-stent restenosis that was subsequently treated with covered stent placement. She also known to have history of iliac artery disease that has been managed conservatively given that her symptoms were not lifestyle limiting.  She also has known history of carotid disease status post bilateral carotid endarterectomy and right carotid to subclavian artery bypass.  She was seen earlier this year for worsening iliac disease.  Her symptoms included bilateral calf pain with walking that happens after walking about 1-2 blocks. Vascular studies last year showed progression of iliac disease with velocity of 529 in the right common iliac and 466 in the left common iliac.  ABI was borderline reduced.  She continues to have severe bilateral leg claudication which forces her to stop after walking about 1 block.  She has no rest pain or lower extremity ulceration.  No chest pain no shortness of breath.  She underwent cholecystectomy in September without complications.   Past Medical History:  Diagnosis Date  . Anemia   . Arthritis   . CAD (coronary artery disease)   . Cardiomyopathy   . Carotid artery occlusion   . CHF (congestive heart failure) ([place]) 2007  . COPD  (chronic obstructive pulmonary disease) (H[facility])    not on home o2  . HTN (hypertension)   . Hypercholesteremia   . Pulmonary nodule   . PVD (peripheral vascular disease) ([place])   . Renal artery stenosis [facility])     Past Surgical History:  Procedure Laterality Date  . CARDIAC CATHETERIZATION     2007  . CHOLECYSTECTOMY N/A 03/01/2019   Procedure: LAPAROSCOPIC CHOLECYSTECTOMY;  Surgeon: Ralene Ok, MD;  Location: [place];  Service: General;  Laterality: N/A;  . CORONARY STENT PLACEMENT      Successful stenting of the right renal ostium   complicated by distal wire perforation.  The wire perforation was  treated with embolization of the branch vessels involved.  Will check CT   scan to assess the right renal parenchymal hematoma.  She will be   maintained on both aspirin and Plavix.  Will place a Foley catheter to   avoid clotting since hematuria is likely.    Marland Kitchen EXTERNAL EAR SURGERY     as a child     Current Outpatient Medications  Medication Sig Dispense Refill  . acetaminophen (TYLENOL) 500 MG tablet Take 1,000 mg by mouth every 6 (six) hours as needed for mild pain or headache.     . albuterol (PROVENTIL HFA;VENTOLIN HFA) 108 (90 Base) MCG/ACT inhaler Inhale 1-2 puffs into the lungs every 6 (six) hours as needed for wheezing or shortness of breath. 1 Inhaler 1  . aspirin 81 MG chewable tablet Chew by mouth daily.    Marland Kitchen BRILINTA 60 MG TABS tablet TAKE 1 TABLET BY MOUTH TWICE DAILY (Patient taking differently:  Take 60 mg by mouth 2 (two) times daily. ) 60 tablet 12  . candesartan (ATACAND) 16 MG tablet Take 1 tablet by mouth once daily (Patient taking differently: Take 16 mg by mouth daily. ) 90 tablet 2  . carvedilol (COREG) 6.25 MG tablet TAKE 1 TABLET BY MOUTH TWICE DAILY WITH MEALS 180 tablet 0  . diphenhydrAMINE (BENADRYL) 25 MG tablet Take 25 mg by mouth every 8 (eight) hours as needed for itching or allergies.    Marland Kitchen omeprazole (PRILOSEC) 40 MG capsule Take 1 capsule (40 mg total) by  mouth daily. Take daily before breakfast or lunch 30 capsule 3  . ondansetron (ZOFRAN) 4 MG tablet Take 1 tablet (4 mg total) by mouth every 6 (six) hours. 12 tablet 0  . oxyCODONE-acetaminophen (PERCOCET/ROXICET) 5-325 MG tablet Take 1 tablet by mouth every 6 (six) hours as needed for severe pain. 12 tablet 0  . simvastatin (ZOCOR) 40 MG tablet TAKE 1 TABLET BY MOUTH ONCE DAILY .  PLEASE  KEEP  UPCOMING  APPOINTMENT  IN  JANUARY  FOR  FURTHER  REFILLS. (Patient taking differently: Take 40 mg by mouth daily at 6 PM. ) 90 tablet 2  . tiotropium (SPIRIVA) 18 MCG inhalation capsule Place 18 mcg into inhaler and inhale daily.     . traMADol (ULTRAM) 50 MG tablet Take 1 tablet (50 mg total) by mouth every 6 (six) hours as needed. 20 tablet 0   No current facility-administered medications for this visit.     Allergies:   Lisinopril and Oxycodone    Social History:  The patient  reports that she quit smoking about 13 years ago. Her smoking use included cigarettes. She has a 31.00 pack-year smoking history. She has never used smokeless tobacco. She reports that she does not drink alcohol or use drugs.   Family History:  The patient's family history includes Cancer in her father; Coronary artery disease in an other family member; Heart disease (age of onset: 8) in her mother; Hyperlipidemia in her father and mother; Hypertension in her father and mother; Peripheral vascular disease in her mother.    ROS:  Please see the history of present illness.   Otherwise, review of systems are positive for none.   All other systems are reviewed and negative.    PHYSICAL EXAM: VS:  BP (!) 146/100   Pulse 64   Ht 5\' 4"  (1.626 m)   Wt 184 lb (83.5 kg)   SpO2 96%   BMI 31.58 kg/m  , BMI Body mass index is 31.58 kg/m. GEN: Well nourished, well developed, in no acute distress  HEENT: normal  Neck: no JVD or masses.  Right carotid bruit Cardiac: RRR; no rubs, or gallops,no edema .  2 out of 6 systolic murmur  in the aortic area Respiratory:  clear to auscultation bilaterally, normal work of breathing GI: soft, nontender, nondistended, + BS MS: no deformity or atrophy  Skin: warm and dry, no rash Neuro:  Strength and sensation are intact Psych: euthymic mood, full affect Vascular: Femoral pulse is mildly diminished bilaterally.  Dorsalis pedis is palpable but diminished on both sides.   EKG:  EKG is ordered today. EKG showed normal sinus rhythm with left bundle branch block.  Recent Labs: 01/20/2019: ALT 17 02/23/2019: BUN 7; Creatinine, Ser 0.81; Hemoglobin 15.4; Platelets 346; Potassium 4.6; Sodium 138    Lipid Panel    Component Value Date/Time   CHOL 135 03/27/2017 0934   TRIG 134 03/27/2017 0934  HDL 41 03/27/2017 0934   CHOLHDL 3.3 03/27/2017 0934   CHOLHDL 3.8 03/04/2016 0959   VLDL 32 (H) 03/04/2016 0959   LDLCALC 67 03/27/2017 0934      Wt Readings from Last 3 Encounters:  04/19/19 184 lb (83.5 kg)  03/01/19 183 lb 6.4 oz (83.2 kg)  02/23/19 183 lb 6.4 oz (83.2 kg)      No flowsheet data found.    ASSESSMENT AND PLAN:   1.  Peripheral arterial disease with severe bilateral leg claudication due to iliac artery disease (Rutherford class III): Her symptoms are lifestyle limiting and she has tried to walk more with no improvement in symptoms.  Due to that, I recommend proceeding with abdominal aortogram with lower extremity runoff and possible endovascular intervention.  I discussed the procedure in details as well as risks and benefits.  She is already on dual antiplatelet therapy with aspirin and ticagrelor.  2.  Carotid artery disease: Followed by [facility].  3.  Hyperlipidemia: Currently on simvastatin 40 mg daily.  Most recent LDL was 67.  4.  Essential hypertension: Blood pressures continues to be mildly elevated.  Continue to monitor closely.    Disposition:   FU with me in 1 months  Signed,  Kathlyn Sacramento, MD  04/19/2019 9:26 AM    [place]

## 2019-04-19 NOTE — Patient Instructions (Addendum)
    [place] [address] [place] [address] Dept: [phone] Loc: [place]  04/19/2019  You are scheduled for a Peripheral Angiogram on Wednesday, December 2 with Dr. Kathlyn Sacramento.  1. Please arrive at the [facility] (Main Entrance A) at [facility]: [address] [place], [place] [address] at 6:30 AM (This time is two hours before your procedure to ensure your preparation). Free valet parking service is available.   Special note: Every effort is made to have your procedure done on time. Please understand that emergencies sometimes delay scheduled procedures.  2. Diet: Do not eat solid foods after midnight.  The patient may have clear liquids until 5am upon the day of the procedure.  3. Labs: You will need to have blood drawn TODAY  GO TO [place] Saturday 05/07/2019 @ 11:30 AM FOR COVID TESTING-GO TO THE PRE-PROCEDURE LINE  4. Medication instructions in preparation for your procedure:  On the morning of your procedure, take your Brilinta/Ticagrelor and any morning medicines NOT listed above.  You may use sips of water.  5. Plan for one night stay--bring personal belongings. 6. Bring a current list of your medications and current insurance cards. 7. You MUST have a responsible person to drive you home. 8. Someone MUST be with you the first 24 hours after you arrive home or your discharge will be delayed. 9. Please wear clothes that are easy to get on and off and wear slip-on shoes.  Thank you for allowing [place] to care for you!   --  Invasive Cardiovascular services   Your physician recommends that you schedule a follow-up appointment in: [place]

## 2019-04-19 NOTE — Progress Notes (Signed)
Cardiology Office Note   Date:  04/19/2019   ID:  Brenda [name], DOB March 05, 1961, MRN [id number]  PCP:  [facility]  Cardiologist:  Dr. Burt Knack  No chief complaint on file.     History of Present Illness: Brenda [name] is a 58 y.o. female who is here today for follow-up regarding peripheral arterial disease. She has multiple medical problems including coronary artery disease, chronic systolic heart failure with subsequent improvement in ejection fraction, COPD, previous tobacco use, hypertension, hyperlipidemia and extensive peripheral arterial disease. She had previous right renal artery stenting in [id number] which was complicated by wire induced perforation that was treated with coils.  She had in-stent restenosis that was subsequently treated with covered stent placement. She also known to have history of iliac artery disease that has been managed conservatively given that her symptoms were not lifestyle limiting.  She also has known history of carotid disease status post bilateral carotid endarterectomy and right carotid to subclavian artery bypass.  She was seen earlier this year for worsening iliac disease.  Her symptoms included bilateral calf pain with walking that happens after walking about 1-2 blocks. Vascular studies last year showed progression of iliac disease with velocity of 529 in the right common iliac and 466 in the left common iliac.  ABI was borderline reduced.  She continues to have severe bilateral leg claudication which forces her to stop after walking about 1 block.  She has no rest pain or lower extremity ulceration.  No chest pain no shortness of breath.  She underwent cholecystectomy in September without complications.   Past Medical History:  Diagnosis Date  . Anemia   . Arthritis   . CAD (coronary artery disease)   . Cardiomyopathy   . Carotid artery occlusion   . CHF (congestive heart failure) ([place]) 2007  . COPD  (chronic obstructive pulmonary disease) (H[facility])    not on home o2  . HTN (hypertension)   . Hypercholesteremia   . Pulmonary nodule   . PVD (peripheral vascular disease) ([place])   . Renal artery stenosis [facility])     Past Surgical History:  Procedure Laterality Date  . CARDIAC CATHETERIZATION     2007  . CHOLECYSTECTOMY N/A 03/01/2019   Procedure: LAPAROSCOPIC CHOLECYSTECTOMY;  Surgeon: Ralene Ok, MD;  Location: [place];  Service: General;  Laterality: N/A;  . CORONARY STENT PLACEMENT      Successful stenting of the right renal ostium   complicated by distal wire perforation.  The wire perforation was  treated with embolization of the branch vessels involved.  Will check CT   scan to assess the right renal parenchymal hematoma.  She will be   maintained on both aspirin and Plavix.  Will place a Foley catheter to   avoid clotting since hematuria is likely.    Marland Kitchen EXTERNAL EAR SURGERY     as a child     Current Outpatient Medications  Medication Sig Dispense Refill  . acetaminophen (TYLENOL) 500 MG tablet Take 1,000 mg by mouth every 6 (six) hours as needed for mild pain or headache.     . albuterol (PROVENTIL HFA;VENTOLIN HFA) 108 (90 Base) MCG/ACT inhaler Inhale 1-2 puffs into the lungs every 6 (six) hours as needed for wheezing or shortness of breath. 1 Inhaler 1  . aspirin 81 MG chewable tablet Chew by mouth daily.    Marland Kitchen BRILINTA 60 MG TABS tablet TAKE 1 TABLET BY MOUTH TWICE DAILY (Patient taking differently:  Take 60 mg by mouth 2 (two) times daily. ) 60 tablet 12  . candesartan (ATACAND) 16 MG tablet Take 1 tablet by mouth once daily (Patient taking differently: Take 16 mg by mouth daily. ) 90 tablet 2  . carvedilol (COREG) 6.25 MG tablet TAKE 1 TABLET BY MOUTH TWICE DAILY WITH MEALS 180 tablet 0  . diphenhydrAMINE (BENADRYL) 25 MG tablet Take 25 mg by mouth every 8 (eight) hours as needed for itching or allergies.    Marland Kitchen omeprazole (PRILOSEC) 40 MG capsule Take 1 capsule (40 mg total) by  mouth daily. Take daily before breakfast or lunch 30 capsule 3  . ondansetron (ZOFRAN) 4 MG tablet Take 1 tablet (4 mg total) by mouth every 6 (six) hours. 12 tablet 0  . oxyCODONE-acetaminophen (PERCOCET/ROXICET) 5-325 MG tablet Take 1 tablet by mouth every 6 (six) hours as needed for severe pain. 12 tablet 0  . simvastatin (ZOCOR) 40 MG tablet TAKE 1 TABLET BY MOUTH ONCE DAILY .  PLEASE  KEEP  UPCOMING  APPOINTMENT  IN  JANUARY  FOR  FURTHER  REFILLS. (Patient taking differently: Take 40 mg by mouth daily at 6 PM. ) 90 tablet 2  . tiotropium (SPIRIVA) 18 MCG inhalation capsule Place 18 mcg into inhaler and inhale daily.     . traMADol (ULTRAM) 50 MG tablet Take 1 tablet (50 mg total) by mouth every 6 (six) hours as needed. 20 tablet 0   No current facility-administered medications for this visit.     Allergies:   Lisinopril and Oxycodone    Social History:  The patient  reports that she quit smoking about 13 years ago. Her smoking use included cigarettes. She has a 31.00 pack-year smoking history. She has never used smokeless tobacco. She reports that she does not drink alcohol or use drugs.   Family History:  The patient's family history includes Cancer in her father; Coronary artery disease in an other family member; Heart disease (age of onset: 84) in her mother; Hyperlipidemia in her father and mother; Hypertension in her father and mother; Peripheral vascular disease in her mother.    ROS:  Please see the history of present illness.   Otherwise, review of systems are positive for none.   All other systems are reviewed and negative.    PHYSICAL EXAM: VS:  BP (!) 146/100   Pulse 64   Ht 5\' 4"  (1.626 m)   Wt 184 lb (83.5 kg)   SpO2 96%   BMI 31.58 kg/m  , BMI Body mass index is 31.58 kg/m. GEN: Well nourished, well developed, in no acute distress  HEENT: normal  Neck: no JVD or masses.  Right carotid bruit Cardiac: RRR; no rubs, or gallops,no edema .  2 out of 6 systolic murmur  in the aortic area Respiratory:  clear to auscultation bilaterally, normal work of breathing GI: soft, nontender, nondistended, + BS MS: no deformity or atrophy  Skin: warm and dry, no rash Neuro:  Strength and sensation are intact Psych: euthymic mood, full affect Vascular: Femoral pulse is mildly diminished bilaterally.  Dorsalis pedis is palpable but diminished on both sides.   EKG:  EKG is ordered today. EKG showed normal sinus rhythm with left bundle branch block.  Recent Labs: 01/20/2019: ALT 17 02/23/2019: BUN 7; Creatinine, Ser 0.81; Hemoglobin 15.4; Platelets 346; Potassium 4.6; Sodium 138    Lipid Panel    Component Value Date/Time   CHOL 135 03/27/2017 0934   TRIG 134 03/27/2017 0934  HDL 41 03/27/2017 0934   CHOLHDL 3.3 03/27/2017 0934   CHOLHDL 3.8 03/04/2016 0959   VLDL 32 (H) 03/04/2016 0959   LDLCALC 67 03/27/2017 0934      Wt Readings from Last 3 Encounters:  04/19/19 184 lb (83.5 kg)  03/01/19 183 lb 6.4 oz (83.2 kg)  02/23/19 183 lb 6.4 oz (83.2 kg)      No flowsheet data found.    ASSESSMENT AND PLAN:   1.  Peripheral arterial disease with severe bilateral leg claudication due to iliac artery disease (Rutherford class III): Her symptoms are lifestyle limiting and she has tried to walk more with no improvement in symptoms.  Due to that, I recommend proceeding with abdominal aortogram with lower extremity runoff and possible endovascular intervention.  I discussed the procedure in details as well as risks and benefits.  She is already on dual antiplatelet therapy with aspirin and ticagrelor.  2.  Carotid artery disease: Followed by [facility].  3.  Hyperlipidemia: Currently on simvastatin 40 mg daily.  Most recent LDL was 67.  4.  Essential hypertension: Blood pressures continues to be mildly elevated.  Continue to monitor closely.    Disposition:   FU with me in 1 months  Signed,  Kathlyn Sacramento, MD  04/19/2019 9:26 AM    [place]

## 2019-04-21 MED ORDER — SODIUM CHLORIDE 0.9% FLUSH: 3.0000 mL | Freq: Two times a day (BID) | INTRAVENOUS | Status: DC

## 2019-05-07 ENCOUNTER — Other Ambulatory Visit (HOSPITAL_COMMUNITY)
Admission: RE | Admit: 2019-05-07 | Discharge: 2019-05-07 | Disposition: A | Discharge status: Home / Self Care | Payer: Medicare Other | Source: Ambulatory Visit | Attending: Cardiovascular Disease | Admitting: Cardiovascular Disease

## 2019-05-07 DIAGNOSIS — Z01812 Encounter for preprocedural laboratory examination: Secondary | ICD-10-CM | POA: Diagnosis present

## 2019-05-07 DIAGNOSIS — Z20828 Contact with and (suspected) exposure to other viral communicable diseases: Secondary | ICD-10-CM | POA: Diagnosis not present

## 2019-05-08 LAB — NOVEL CORONAVIRUS, NAA (HOSP ORDER, SEND-OUT TO REF LAB; TAT 18-24 HRS): SARS-CoV-2, NAA: NOT DETECTED

## 2019-05-09 ENCOUNTER — Telehealth: Payer: Self-pay | Admitting: Cardiovascular Disease

## 2019-05-09 NOTE — Telephone Encounter (Signed)
Patient has a procedure on Wednesday 05/11/19 with Dr. Fletcher Anon. She states she now has a kidney infection and has been put on a new antibiotic. She needs to know if the procedure needs to be rescheduled.

## 2019-05-09 NOTE — Telephone Encounter (Signed)
Will this be an issue? Will route to MD to make sure okay for procedure.

## 2019-05-09 NOTE — Telephone Encounter (Signed)
If the kidney infection is improving we can still proceed on Wednesday.

## 2019-05-10 ENCOUNTER — Telehealth: Payer: Self-pay | Admitting: *Deleted

## 2019-05-10 NOTE — Telephone Encounter (Signed)
Left a message for the patient to call back for details about the kidney infection.

## 2019-05-10 NOTE — Telephone Encounter (Addendum)
Pt contacted pre-abdominal aortogram scheduled at [facility] for: Wednesday May 11, 2019 8:30 AM Verified arrival time and place: [place] [facility]) at: 6:30 AM   No solid food after midnight prior to cath, clear liquids until 5 AM day of procedure. Contrast allergy: no   AM meds can be  taken pre-cath with sip of water including: ASA 81 mg Brilinta 60 mg  Confirmed patient has responsible adult to drive home post procedure and observe 24 hours after arriving home: yes  Currently, due to Covid-19 pandemic, only one support person will be allowed with patient. Must be the same support person for that patient's entire stay, will be screened and required to wear a mask. They will be asked to wait in the waiting room for the duration of the patient's stay.  Patients are required to wear a mask when they enter the hospital.      COVID-19 Pre-Screening Questions:  . In the past 7 to 10 days have you had a cough,  shortness of breath, headache, congestion, fever (100 or greater) body aches, chills, sore throat, or sudden loss of taste or sense of smell? no . Have you been around anyone with known Covid 19? no . Have you been around anyone who is awaiting Covid 19 test results in the past 7 to 10 days? no . Have you been around anyone who has been exposed to Covid 19, or has mentioned symptoms of Covid 19 within the past 7 to 10 days? no      I reviewed procedure/mask/visitor instructions, Covid-19 screening questions with patient, she verbalized understanding, thanked me for call.   Copied from phone note 05/09/19: Patient has a procedure on Wednesday 05/11/19 with Dr. Fletcher Anon. She states she now has a kidney infection and has been put on a new antibiotic. She needs to know if the procedure needs to be rescheduled.  Per Dr Brain Hilts the kidney infection is improving we can still proceed on Wednesday.   Pt states she is improving since being prescribed  antibiotic and is aware per Dr Fletcher Anon okay to proceed.

## 2019-05-11 ENCOUNTER — Other Ambulatory Visit: Payer: Self-pay

## 2019-05-11 ENCOUNTER — Ambulatory Visit (HOSPITAL_COMMUNITY)
Admission: RE | Admit: 2019-05-11 | Discharge: 2019-05-11 | Disposition: A | Discharge status: Home / Self Care | Payer: Medicare Other | Attending: Cardiovascular Disease | Admitting: Cardiovascular Disease

## 2019-05-11 ENCOUNTER — Encounter (HOSPITAL_COMMUNITY)
Admission: RE | Disposition: A | Discharge status: Home / Self Care | Payer: Self-pay | Source: Home / Self Care | Attending: Cardiovascular Disease

## 2019-05-11 ENCOUNTER — Other Ambulatory Visit: Payer: Self-pay | Admitting: *Deleted

## 2019-05-11 DIAGNOSIS — I429 Cardiomyopathy, unspecified: Secondary | ICD-10-CM | POA: Insufficient documentation

## 2019-05-11 DIAGNOSIS — E785 Hyperlipidemia, unspecified: Secondary | ICD-10-CM | POA: Diagnosis not present

## 2019-05-11 DIAGNOSIS — I5022 Chronic systolic (congestive) heart failure: Secondary | ICD-10-CM | POA: Insufficient documentation

## 2019-05-11 DIAGNOSIS — J449 Chronic obstructive pulmonary disease, unspecified: Secondary | ICD-10-CM | POA: Insufficient documentation

## 2019-05-11 DIAGNOSIS — E7800 Pure hypercholesterolemia, unspecified: Secondary | ICD-10-CM | POA: Diagnosis not present

## 2019-05-11 DIAGNOSIS — Z87891 Personal history of nicotine dependence: Secondary | ICD-10-CM | POA: Diagnosis not present

## 2019-05-11 DIAGNOSIS — Z79899 Other long term (current) drug therapy: Secondary | ICD-10-CM | POA: Diagnosis not present

## 2019-05-11 DIAGNOSIS — Z7982 Long term (current) use of aspirin: Secondary | ICD-10-CM | POA: Diagnosis not present

## 2019-05-11 DIAGNOSIS — I739 Peripheral vascular disease, unspecified: Secondary | ICD-10-CM

## 2019-05-11 DIAGNOSIS — I110 Hypertensive heart disease with heart failure: Secondary | ICD-10-CM | POA: Insufficient documentation

## 2019-05-11 DIAGNOSIS — Z888 Allergy status to other drugs, medicaments and biological substances status: Secondary | ICD-10-CM | POA: Diagnosis not present

## 2019-05-11 DIAGNOSIS — I70213 Atherosclerosis of native arteries of extremities with intermittent claudication, bilateral legs: Secondary | ICD-10-CM

## 2019-05-11 DIAGNOSIS — Z885 Allergy status to narcotic agent status: Secondary | ICD-10-CM | POA: Insufficient documentation

## 2019-05-11 DIAGNOSIS — I2510 Atherosclerotic heart disease of native coronary artery without angina pectoris: Secondary | ICD-10-CM | POA: Insufficient documentation

## 2019-05-11 HISTORY — PX: ABDOMINAL AORTOGRAM W/LOWER EXTREMITY: CATH118223

## 2019-05-11 HISTORY — PX: PERIPHERAL VASCULAR INTERVENTION: CATH118257

## 2019-05-11 LAB — POCT ACTIVATED CLOTTING TIME: Activated Clotting Time: 241 seconds

## 2019-05-11 SURGERY — ABDOMINAL AORTOGRAM W/LOWER EXTREMITY
Anesthesia: LOCAL

## 2019-05-11 MED ORDER — HEPARIN (PORCINE) IN NACL 1000-0.9 UT/500ML-% IV SOLN
INTRAVENOUS | Status: DC | PRN
  Administered 2019-05-11: 500 mL

## 2019-05-11 MED ORDER — MIDAZOLAM HCL 2 MG/2ML IJ SOLN
INTRAMUSCULAR | Status: DC | PRN
  Administered 2019-05-11 (×2): 1 mg via INTRAVENOUS

## 2019-05-11 MED ORDER — FENTANYL CITRATE (PF) 100 MCG/2ML IJ SOLN
INTRAMUSCULAR | Status: AC
  Filled 2019-05-11: qty 2

## 2019-05-11 MED ORDER — ASPIRIN 81 MG PO CHEW: 81.0000 mg | CHEWABLE_TABLET | ORAL | Status: DC

## 2019-05-11 MED ORDER — HEPARIN SODIUM (PORCINE) 1000 UNIT/ML IJ SOLN
INTRAMUSCULAR | Status: DC | PRN
  Administered 2019-05-11: 7000 [IU] via INTRAVENOUS

## 2019-05-11 MED ORDER — LIDOCAINE HCL (PF) 1 % IJ SOLN
INTRAMUSCULAR | Status: DC | PRN
  Administered 2019-05-11 (×2): 15 mL

## 2019-05-11 MED ORDER — FENTANYL CITRATE (PF) 100 MCG/2ML IJ SOLN
INTRAMUSCULAR | Status: DC | PRN
  Administered 2019-05-11: 50 ug via INTRAVENOUS

## 2019-05-11 MED ORDER — SODIUM CHLORIDE 0.9% FLUSH: 3.0000 mL | INTRAVENOUS | Status: DC | PRN

## 2019-05-11 MED ORDER — HEPARIN (PORCINE) IN NACL 1000-0.9 UT/500ML-% IV SOLN
INTRAVENOUS | Status: AC
  Filled 2019-05-11: qty 1000

## 2019-05-11 MED ORDER — LIDOCAINE HCL (PF) 1 % IJ SOLN
INTRAMUSCULAR | Status: AC
  Filled 2019-05-11: qty 30

## 2019-05-11 MED ORDER — HEPARIN SODIUM (PORCINE) 1000 UNIT/ML IJ SOLN
INTRAMUSCULAR | Status: AC
  Filled 2019-05-11: qty 1

## 2019-05-11 MED ORDER — ASPIRIN EC 81 MG PO TBEC: 81.0000 mg | DELAYED_RELEASE_TABLET | Freq: Every day | ORAL | 2 refills | Status: AC

## 2019-05-11 MED ORDER — NITROGLYCERIN 1 MG/10 ML FOR IR/CATH LAB
INTRA_ARTERIAL | Status: AC
  Filled 2019-05-11: qty 10

## 2019-05-11 MED ORDER — HYDRALAZINE HCL 20 MG/ML IJ SOLN
INTRAMUSCULAR | Status: DC | PRN
  Administered 2019-05-11: 10 mg via INTRAVENOUS

## 2019-05-11 MED ORDER — HYDRALAZINE HCL 20 MG/ML IJ SOLN
INTRAMUSCULAR | Status: AC
  Filled 2019-05-11: qty 1

## 2019-05-11 MED ORDER — SODIUM CHLORIDE 0.9 % IV SOLN: 250.0000 mL | INTRAVENOUS | Status: DC | PRN

## 2019-05-11 MED ORDER — IODIXANOL 320 MG/ML IV SOLN
INTRAVENOUS | Status: DC | PRN
  Administered 2019-05-11: 160 mL via INTRA_ARTERIAL

## 2019-05-11 MED ORDER — MIDAZOLAM HCL 2 MG/2ML IJ SOLN
INTRAMUSCULAR | Status: AC
  Filled 2019-05-11: qty 2

## 2019-05-11 MED ORDER — NITROGLYCERIN 1 MG/10 ML FOR IR/CATH LAB
INTRA_ARTERIAL | Status: DC | PRN
  Administered 2019-05-11: 200 ug via INTRA_ARTERIAL

## 2019-05-11 MED ORDER — SODIUM CHLORIDE 0.9 % IV SOLN
INTRAVENOUS | Status: DC
  Administered 2019-05-11: 07:00:00 via INTRAVENOUS

## 2019-05-11 SURGICAL SUPPLY — 22 items
BALLN MUSTANG 7.0X40 75 (BALLOONS) ×3
BALLN MUSTANG 8.0X40 75 (BALLOONS) ×3
BALLOON MUSTANG 7.0X40 75 (BALLOONS) IMPLANT
BALLOON MUSTANG 8.0X40 75 (BALLOONS) IMPLANT
CATH ANGIO 5F PIGTAIL 65CM (CATHETERS) ×1 IMPLANT
CATH STRAIGHT 5FR 65CM (CATHETERS) ×1 IMPLANT
CLOSURE MYNX CONTROL 6F/7F (Vascular Products) ×2 IMPLANT
KIT ENCORE 26 ADVANTAGE (KITS) ×1 IMPLANT
KIT MICROPUNCTURE NIT STIFF (SHEATH) ×1 IMPLANT
KIT PV (KITS) ×3 IMPLANT
SHEATH BRITE TIP 7FR 35CM (SHEATH) ×2 IMPLANT
SHEATH PINNACLE 5F 10CM (SHEATH) ×1 IMPLANT
SHEATH PINNACLE 7F 10CM (SHEATH) ×2 IMPLANT
SHEATH PROBE COVER 6X72 (BAG) ×1 IMPLANT
STENT EPIC VASCULAR 9X40X75 (Permanent Stent) ×1 IMPLANT
STENT INNOVA 8X40X130 (Permanent Stent) ×1 IMPLANT
SYR MEDRAD MARK 7 150ML (SYRINGE) ×3 IMPLANT
TRANSDUCER W/STOPCOCK (MISCELLANEOUS) ×3 IMPLANT
TRAY PV CATH (CUSTOM PROCEDURE TRAY) ×3 IMPLANT
WIRE BENTSON .035X145CM (WIRE) ×1 IMPLANT
WIRE HI TORQ VERSACORE J 260CM (WIRE) ×1 IMPLANT
WIRE TORQFLEX AUST .018X40CM (WIRE) ×1 IMPLANT

## 2019-05-11 NOTE — Discharge Instructions (Signed)
° ° °  Discharge Instructions  Lower Extremity Angiogram; Angioplasty/Stenting  Please refer to the following instructions for your post-procedure care. Your surgeon or physician assistant will discuss any changes with you.  Activity  Avoid lifting more than 8 pounds (1 gallons of milk) for 5 days after your procedure. You may walk as much as you can tolerate. It's OK to drive after 24 hours.  Bathing/Showering  You may shower the day after your procedure. If you have a bandage, you may remove it at 24- 48 hours. Clean your incision site with mild soap and water. Pat the area dry with a clean towel.  Diet  Resume your pre-procedure diet. There are no special food restrictions following this procedure. All patients with peripheral vascular disease should follow a low fat/low cholesterol diet. In order to heal from your surgery, it is CRITICAL to get adequate nutrition. Your body requires vitamins, minerals, and protein. Vegetables are the best source of vitamins and minerals. Vegetables also provide the perfect balance of protein. Processed food has little nutritional value, so try to avoid this.  Medications  Resume taking all of your medications unless your doctor tells you not to. If your incision is causing pain, you may take over-the-counter pain relievers such as acetaminophen (Tylenol)  Follow Up  Follow up will be arranged at the time of your procedure. You may have an office visit scheduled or may be scheduled for surgery. Ask your surgeon if you have any questions.  Please call us immediately for any of the following conditions: Severe or worsening pain your legs or feet at rest or with walking. Increased pain, redness, drainage at your groin puncture site. Fever of 101 degrees or higher. If you have any mild or slow bleeding from your puncture site: lie down, apply firm constant pressure over the area with a piece of gauze or a clean wash cloth for 30 minutes- no peeking!,  call 911 right away if you are still bleeding after 30 minutes, or if the bleeding is heavy and unmanageable.  Reduce your risk factors of vascular disease:  Stop smoking. If you would like help call [facility] at 1-800-QUIT-NOW 732-788-1379) or [place] at [phone]. Manage your cholesterol Maintain a desired weight Control your diabetes Keep your blood pressure down  If you have any questions, please call the office at [phone]

## 2019-05-11 NOTE — Interval H&P Note (Signed)
History and Physical Interval Note:  05/11/2019 8:43 AM  Brenda [name]  has presented today for surgery, with the diagnosis of pad.  The various methods of treatment have been discussed with the patient and family. After consideration of risks, benefits and other options for treatment, the patient has consented to  Procedure(s): ABDOMINAL AORTOGRAM W/LOWER EXTREMITY (N/A) as a surgical intervention.  The patient's history has been reviewed, patient examined, no change in status, stable for surgery.  I have reviewed the patient's chart and labs.  Questions were answered to the patient's satisfaction.     Kathlyn Sacramento

## 2019-05-12 ENCOUNTER — Encounter (HOSPITAL_COMMUNITY): Payer: Self-pay | Admitting: Cardiovascular Disease

## 2019-05-23 ENCOUNTER — Other Ambulatory Visit: Payer: Self-pay | Admitting: Family Medicine

## 2019-05-23 DIAGNOSIS — R109 Unspecified abdominal pain: Secondary | ICD-10-CM

## 2019-05-23 DIAGNOSIS — R1011 Right upper quadrant pain: Secondary | ICD-10-CM

## 2019-05-25 ENCOUNTER — Ambulatory Visit: Payer: Medicare Other | Admitting: Gastroenterology

## 2019-05-26 ENCOUNTER — Ambulatory Visit (HOSPITAL_BASED_OUTPATIENT_CLINIC_OR_DEPARTMENT_OTHER)
Admission: RE | Admit: 2019-05-26 | Discharge: 2019-05-26 | Disposition: A | Discharge status: Home / Self Care | Payer: Medicare Other | Source: Ambulatory Visit | Attending: Cardiovascular Disease | Admitting: Cardiovascular Disease

## 2019-05-26 ENCOUNTER — Ambulatory Visit (HOSPITAL_COMMUNITY)
Admission: RE | Admit: 2019-05-26 | Discharge: 2019-05-26 | Disposition: A | Discharge status: Home / Self Care | Payer: Medicare Other | Source: Ambulatory Visit | Attending: Cardiology | Admitting: Cardiology

## 2019-05-26 ENCOUNTER — Other Ambulatory Visit (HOSPITAL_COMMUNITY): Payer: Self-pay | Admitting: Cardiovascular Disease

## 2019-05-26 ENCOUNTER — Other Ambulatory Visit: Payer: Self-pay

## 2019-05-26 DIAGNOSIS — I739 Peripheral vascular disease, unspecified: Secondary | ICD-10-CM | POA: Insufficient documentation

## 2019-05-26 DIAGNOSIS — Z95828 Presence of other vascular implants and grafts: Secondary | ICD-10-CM

## 2019-06-14 ENCOUNTER — Ambulatory Visit: Payer: Medicare Other | Admitting: Cardiovascular Disease

## 2019-06-14 ENCOUNTER — Other Ambulatory Visit: Payer: Self-pay

## 2019-06-14 ENCOUNTER — Encounter: Payer: Self-pay | Admitting: Cardiovascular Disease

## 2019-06-14 VITALS — BP 177/85 | HR 71 | Temp 97.9°F | Ht 63.0 in | Wt 183.0 lb

## 2019-06-14 DIAGNOSIS — I779 Disorder of arteries and arterioles, unspecified: Secondary | ICD-10-CM | POA: Diagnosis not present

## 2019-06-14 DIAGNOSIS — I10 Essential (primary) hypertension: Secondary | ICD-10-CM

## 2019-06-14 DIAGNOSIS — E785 Hyperlipidemia, unspecified: Secondary | ICD-10-CM

## 2019-06-14 DIAGNOSIS — I739 Peripheral vascular disease, unspecified: Secondary | ICD-10-CM | POA: Diagnosis not present

## 2019-06-14 NOTE — Patient Instructions (Addendum)
Medication Instructions:  Your physician recommends that you continue on your current medications as directed. Please refer to the Current Medication list given to you today.  *If you need a refill on your cardiac medications before your next appointment, please call your pharmacy*  Lab Work: NONE  Testing/Procedures: NONE  Follow-Up: At [facility], you and your health needs are our priority.  As part of our continuing mission to provide you with exceptional heart care, we have created designated Provider Care Teams.  These Care Teams include your primary Cardiologist (physician) and Advanced Practice Providers (APPs -  Physician Assistants and Nurse Practitioners) who all work together to provide you with the care you need, when you need it.  Your next appointment:   6 month(s)You will receive a reminder letter in the mail two months in advance. If you don't receive a letter, please call our office to schedule the follow-up appointment.  The format for your next appointment:   In Person  Provider:   DR Fletcher Anon

## 2019-06-14 NOTE — Progress Notes (Signed)
Cardiology Office Note   Date:  06/14/2019   ID:  Brenda [name], DOB 07/18/1960, MRN [id number]  PCP:  [facility]  Cardiologist:  Dr. Burt Knack  No chief complaint on file.     History of Present Illness: Brenda [name] is a 59 y.o. female who is here today for follow-up regarding peripheral arterial disease. She has multiple medical problems including coronary artery disease, chronic systolic heart failure with subsequent improvement in ejection fraction, COPD, previous tobacco use, hypertension, hyperlipidemia and extensive peripheral arterial disease. She had previous right renal artery stenting in [id number] which was complicated by wire induced perforation that was treated with coils.  She had in-stent restenosis that was subsequently treated with covered stent placement. She also has known history of carotid disease status post bilateral carotid endarterectomy and right carotid to subclavian artery bypass.  She was seen earlier this year for worsening claudication due to iliac disease. Vascular studies last year showed progression of iliac disease with velocity of 529 in the right common iliac and 466 in the left common iliac.  ABI was borderline reduced.  I proceeded with angiography last month which showed patent renal artery stents with mild in-stent restenosis, patent bilateral kissing common iliac artery stents extending into the distal aorta with mild in-stent restenosis with significant disease distal to the stents.  2 additional self-expanding stents were placed bilaterally.  She denies leg claudication at the present time.  She is still limited by right-sided back pain which has been evaluated by her primary care physician.  She thinks she pulled a muscle.   Past Medical History:  Diagnosis Date  . Anemia   . Arthritis   . CAD (coronary artery disease)   . Cardiomyopathy   . Carotid artery occlusion   . CHF (congestive heart failure)  ([place]) 2007  . COPD (chronic obstructive pulmonary disease) (HCC)    not on home o2  . HTN (hypertension)   . Hypercholesteremia   . Pulmonary nodule   . PVD (peripheral vascular disease) ([place])   . Renal artery stenosis [facility])     Past Surgical History:  Procedure Laterality Date  . ABDOMINAL AORTOGRAM W/LOWER EXTREMITY N/A 05/11/2019   Procedure: ABDOMINAL AORTOGRAM W/LOWER EXTREMITY;  Surgeon: Wellington Hampshire, MD;  Location: [place] [facility];  Service: Cardiovascular;  Laterality: N/A;  . CARDIAC CATHETERIZATION     2007  . CHOLECYSTECTOMY N/A 03/01/2019   Procedure: LAPAROSCOPIC CHOLECYSTECTOMY;  Surgeon: Ralene Ok, MD;  Location: [place];  Service: General;  Laterality: N/A;  . CORONARY STENT PLACEMENT      Successful stenting of the right renal ostium   complicated by distal wire perforation.  The wire perforation was  treated with embolization of the branch vessels involved.  Will check CT   scan to assess the right renal parenchymal hematoma.  She will be   maintained on both aspirin and Plavix.  Will place a Foley catheter to   avoid clotting since hematuria is likely.    Marland Kitchen EXTERNAL EAR SURGERY     as a child  . PERIPHERAL VASCULAR INTERVENTION  05/11/2019   Procedure: PERIPHERAL VASCULAR INTERVENTION;  Surgeon: Wellington Hampshire, MD;  Location: [place] [facility];  Service: Cardiovascular;;  Bilater Iliac     Current Outpatient Medications  Medication Sig Dispense Refill  . albuterol (PROVENTIL HFA;VENTOLIN HFA) 108 (90 Base) MCG/ACT inhaler Inhale 1-2 puffs into the lungs every 6 (six) hours as needed for  wheezing or shortness of breath. 1 Inhaler 1  . aspirin EC 81 MG tablet Take 1 tablet (81 mg total) by mouth daily. 150 tablet 2  . BRILINTA 60 MG TABS tablet TAKE 1 TABLET BY MOUTH TWICE DAILY (Patient taking differently: Take 60 mg by mouth 2 (two) times daily. ) 60 tablet 12  . candesartan (ATACAND) 16 MG tablet Take 1 tablet by mouth once daily (Patient taking  differently: Take 16 mg by mouth daily. ) 90 tablet 2  . carvedilol (COREG) 6.25 MG tablet TAKE 1 TABLET BY MOUTH TWICE DAILY WITH MEALS (Patient taking differently: Take 6.25 mg by mouth 2 (two) times daily. ) 180 tablet 0  . omeprazole (PRILOSEC) 40 MG capsule Take 1 capsule (40 mg total) by mouth daily. Take daily before breakfast or lunch 30 capsule 3  . simvastatin (ZOCOR) 40 MG tablet Take 1 tablet (40 mg total) by mouth daily at 6 PM. 90 tablet 3  . tiotropium (SPIRIVA) 18 MCG inhalation capsule Place 18 mcg into inhaler and inhale daily.      Current Facility-Administered Medications  Medication Dose Route Frequency Provider Last Rate Last Admin  . sodium chloride flush (NS) 0.9 % injection 3 mL  3 mL Intravenous Q12H Wellington Hampshire, MD        Allergies:   Lisinopril and Oxycodone    Social History:  The patient  reports that she quit smoking about 14 years ago. Her smoking use included cigarettes. She has a 31.00 pack-year smoking history. She has never used smokeless tobacco. She reports that she does not drink alcohol or use drugs.   Family History:  The patient's family history includes Cancer in her father; Coronary artery disease in an other family member; Heart disease (age of onset: 71) in her mother; Hyperlipidemia in her father and mother; Hypertension in her father and mother; Peripheral vascular disease in her mother.    ROS:  Please see the history of present illness.   Otherwise, review of systems are positive for none.   All other systems are reviewed and negative.    PHYSICAL EXAM: VS:  BP (!) 177/85   Pulse 71   Temp 97.9 F (36.6 C)   Ht 5\' 3"  (1.6 m)   Wt 183 lb (83 kg)   SpO2 97%   BMI 32.42 kg/m  , BMI Body mass index is 32.42 kg/m. GEN: Well nourished, well developed, in no acute distress  HEENT: normal  Neck: no JVD or masses.  Right carotid bruit Cardiac: RRR; no rubs, or gallops,no edema .  2 out of 6 systolic murmur in the aortic  area Respiratory:  clear to auscultation bilaterally, normal work of breathing GI: soft, nontender, nondistended, + BS MS: no deformity or atrophy  Skin: warm and dry, no rash Neuro:  Strength and sensation are intact Psych: euthymic mood, full affect Vascular: Femoral pulse is mildly diminished bilaterally.    EKG:  EKG is not ordered today.   Recent Labs: 01/20/2019: ALT 17 04/19/2019: BUN 6; Creatinine, Ser 0.86; Hemoglobin 15.3; Platelets 321; Potassium 4.2; Sodium 141    Lipid Panel    Component Value Date/Time   CHOL 135 03/27/2017 0934   TRIG 134 03/27/2017 0934   HDL 41 03/27/2017 0934   CHOLHDL 3.3 03/27/2017 0934   CHOLHDL 3.8 03/04/2016 0959   VLDL 32 (H) 03/04/2016 0959   LDLCALC 67 03/27/2017 0934      Wt Readings from Last 3 Encounters:  06/14/19 183 lb (  83 kg)  05/11/19 184 lb (83.5 kg)  04/19/19 184 lb (83.5 kg)      No flowsheet data found.    ASSESSMENT AND PLAN:   1.  Peripheral arterial disease: Status post recent endovascular intervention on bilateral common iliac arteries into the external iliac arteries.  Postprocedure ABI improved to normal.  Repeat vascular studies and 6 months.   2.  Carotid artery disease: Followed by [facility].  Most recent carotid Doppler in our office in October showed less than 40% stenosis bilaterally.  There was patent right carotid to subclavian artery graft.  3.  Hyperlipidemia: Currently on simvastatin 40 mg daily.  Most recent LDL was 67.  4.  Essential hypertension: Blood pressure is elevated but she reports being in pain from her back.  5.  Left subclavian artery stenosis: She denies left arm claudication.  Thus, no indication for revascularization.  Blood pressure should always be checked in the right arm.    Disposition:   FU with me in 6 months  Signed,  Kathlyn Sacramento, MD  06/14/2019 10:16 AM    [place]

## 2019-06-19 ENCOUNTER — Other Ambulatory Visit: Payer: Self-pay | Admitting: Cardiovascular Disease

## 2019-07-09 ENCOUNTER — Emergency Department (HOSPITAL_COMMUNITY): Payer: Medicare Other

## 2019-07-09 ENCOUNTER — Other Ambulatory Visit: Payer: Self-pay

## 2019-07-09 ENCOUNTER — Inpatient Hospital Stay (HOSPITAL_COMMUNITY)
Admission: EM | Admit: 2019-07-09 | Discharge: 2019-07-13 | DRG: 177 | Disposition: A | Discharge status: Home / Self Care | Payer: Medicare Other | Attending: Internal Medicine | Admitting: Internal Medicine

## 2019-07-09 ENCOUNTER — Encounter (HOSPITAL_COMMUNITY): Payer: Self-pay | Admitting: Emergency Medicine

## 2019-07-09 DIAGNOSIS — Z955 Presence of coronary angioplasty implant and graft: Secondary | ICD-10-CM

## 2019-07-09 DIAGNOSIS — E669 Obesity, unspecified: Secondary | ICD-10-CM | POA: Diagnosis present

## 2019-07-09 DIAGNOSIS — I10 Essential (primary) hypertension: Secondary | ICD-10-CM | POA: Diagnosis present

## 2019-07-09 DIAGNOSIS — I5032 Chronic diastolic (congestive) heart failure: Secondary | ICD-10-CM | POA: Diagnosis present

## 2019-07-09 DIAGNOSIS — J449 Chronic obstructive pulmonary disease, unspecified: Secondary | ICD-10-CM | POA: Diagnosis present

## 2019-07-09 DIAGNOSIS — J069 Acute upper respiratory infection, unspecified: Secondary | ICD-10-CM | POA: Diagnosis not present

## 2019-07-09 DIAGNOSIS — Z7951 Long term (current) use of inhaled steroids: Secondary | ICD-10-CM | POA: Diagnosis not present

## 2019-07-09 DIAGNOSIS — Z87891 Personal history of nicotine dependence: Secondary | ICD-10-CM | POA: Diagnosis not present

## 2019-07-09 DIAGNOSIS — J9601 Acute respiratory failure with hypoxia: Secondary | ICD-10-CM | POA: Diagnosis present

## 2019-07-09 DIAGNOSIS — Z6831 Body mass index (BMI) 31.0-31.9, adult: Secondary | ICD-10-CM

## 2019-07-09 DIAGNOSIS — E1151 Type 2 diabetes mellitus with diabetic peripheral angiopathy without gangrene: Secondary | ICD-10-CM | POA: Diagnosis present

## 2019-07-09 DIAGNOSIS — I2510 Atherosclerotic heart disease of native coronary artery without angina pectoris: Secondary | ICD-10-CM | POA: Diagnosis present

## 2019-07-09 DIAGNOSIS — E7800 Pure hypercholesterolemia, unspecified: Secondary | ICD-10-CM | POA: Diagnosis present

## 2019-07-09 DIAGNOSIS — I110 Hypertensive heart disease with heart failure: Secondary | ICD-10-CM | POA: Diagnosis present

## 2019-07-09 DIAGNOSIS — Z79899 Other long term (current) drug therapy: Secondary | ICD-10-CM | POA: Diagnosis not present

## 2019-07-09 DIAGNOSIS — E1165 Type 2 diabetes mellitus with hyperglycemia: Secondary | ICD-10-CM | POA: Diagnosis present

## 2019-07-09 DIAGNOSIS — U071 COVID-19: Secondary | ICD-10-CM

## 2019-07-09 DIAGNOSIS — R739 Hyperglycemia, unspecified: Secondary | ICD-10-CM | POA: Diagnosis present

## 2019-07-09 DIAGNOSIS — J189 Pneumonia, unspecified organism: Secondary | ICD-10-CM | POA: Insufficient documentation

## 2019-07-09 DIAGNOSIS — I429 Cardiomyopathy, unspecified: Secondary | ICD-10-CM | POA: Diagnosis present

## 2019-07-09 DIAGNOSIS — R778 Other specified abnormalities of plasma proteins: Secondary | ICD-10-CM

## 2019-07-09 DIAGNOSIS — E785 Hyperlipidemia, unspecified: Secondary | ICD-10-CM | POA: Diagnosis present

## 2019-07-09 DIAGNOSIS — Z7982 Long term (current) use of aspirin: Secondary | ICD-10-CM

## 2019-07-09 DIAGNOSIS — R0602 Shortness of breath: Secondary | ICD-10-CM | POA: Diagnosis present

## 2019-07-09 LAB — CBC WITH DIFFERENTIAL/PLATELET
Abs Immature Granulocytes: 0.02 10*3/uL (ref 0.00–0.07)
Basophils Absolute: 0 10*3/uL (ref 0.0–0.1)
Basophils Relative: 0 %
Eosinophils Absolute: 0 10*3/uL (ref 0.0–0.5)
Eosinophils Relative: 0 %
HCT: 44.2 % (ref 36.0–46.0)
Hemoglobin: 14.6 g/dL (ref 12.0–15.0)
Immature Granulocytes: 0 %
Lymphocytes Relative: 17 %
Lymphs Abs: 0.9 10*3/uL (ref 0.7–4.0)
MCH: 30.6 pg (ref 26.0–34.0)
MCHC: 33 g/dL (ref 30.0–36.0)
MCV: 92.7 fL (ref 80.0–100.0)
Monocytes Absolute: 0.6 10*3/uL (ref 0.1–1.0)
Monocytes Relative: 10 %
Neutro Abs: 4 10*3/uL (ref 1.7–7.7)
Neutrophils Relative %: 73 %
Platelets: 191 10*3/uL (ref 150–400)
RBC: 4.77 MIL/uL (ref 3.87–5.11)
RDW: 12.9 % (ref 11.5–15.5)
WBC: 5.5 10*3/uL (ref 4.0–10.5)
nRBC: 0 % (ref 0.0–0.2)

## 2019-07-09 LAB — I-STAT CHEM 8, ED
BUN: 7 mg/dL (ref 6–20)
Calcium, Ion: 1.09 mmol/L — ABNORMAL LOW (ref 1.15–1.40)
Chloride: 105 mmol/L (ref 98–111)
Creatinine, Ser: 0.7 mg/dL (ref 0.44–1.00)
Glucose, Bld: 91 mg/dL (ref 70–99)
HCT: 43 % (ref 36.0–46.0)
Hemoglobin: 14.6 g/dL (ref 12.0–15.0)
Potassium: 3.4 mmol/L — ABNORMAL LOW (ref 3.5–5.1)
Sodium: 138 mmol/L (ref 135–145)
TCO2: 23 mmol/L (ref 22–32)

## 2019-07-09 LAB — LACTATE DEHYDROGENASE: LDH: 173 U/L (ref 98–192)

## 2019-07-09 LAB — HIV ANTIBODY (ROUTINE TESTING W REFLEX): HIV Screen 4th Generation wRfx: NONREACTIVE

## 2019-07-09 LAB — COMPREHENSIVE METABOLIC PANEL
ALT: 20 U/L (ref 0–44)
AST: 28 U/L (ref 15–41)
Albumin: 3.4 g/dL — ABNORMAL LOW (ref 3.5–5.0)
Alkaline Phosphatase: 73 U/L (ref 38–126)
Anion gap: 12 (ref 5–15)
BUN: 9 mg/dL (ref 6–20)
CO2: 21 mmol/L — ABNORMAL LOW (ref 22–32)
Calcium: 8.7 mg/dL — ABNORMAL LOW (ref 8.9–10.3)
Chloride: 102 mmol/L (ref 98–111)
Creatinine, Ser: 0.96 mg/dL (ref 0.44–1.00)
GFR calc Af Amer: >60 mL/min (ref >60)
GFR calc non Af Amer: >60 mL/min (ref >60)
Glucose, Bld: 105 mg/dL — ABNORMAL HIGH (ref 70–99)
Potassium: 3.7 mmol/L (ref 3.5–5.1)
Sodium: 135 mmol/L (ref 135–145)
Total Bilirubin: 0.4 mg/dL (ref 0.3–1.2)
Total Protein: 6.8 g/dL (ref 6.5–8.1)

## 2019-07-09 LAB — RESPIRATORY PANEL BY RT PCR (FLU A&B, COVID)
Influenza A by PCR: NEGATIVE
Influenza B by PCR: NEGATIVE
SARS Coronavirus 2 by RT PCR: POSITIVE — AB

## 2019-07-09 LAB — D-DIMER, QUANTITATIVE: D-Dimer, Quant: 0.54 ug/mL-FEU — ABNORMAL HIGH (ref 0.00–0.50)

## 2019-07-09 LAB — POC SARS CORONAVIRUS 2 AG -  ED: SARS Coronavirus 2 Ag: NEGATIVE

## 2019-07-09 LAB — TROPONIN I (HIGH SENSITIVITY)
Troponin I (High Sensitivity): 34 ng/L — ABNORMAL HIGH (ref <18)
Troponin I (High Sensitivity): 35 ng/L — ABNORMAL HIGH (ref <18)

## 2019-07-09 LAB — PROCALCITONIN: Procalcitonin: 0.28 ng/mL

## 2019-07-09 LAB — FERRITIN: Ferritin: 210 ng/mL (ref 11–307)

## 2019-07-09 LAB — HEMOGLOBIN A1C
Hgb A1c MFr Bld: 5.6 % (ref 4.8–5.6)
Mean Plasma Glucose: 114.02 mg/dL

## 2019-07-09 LAB — FIBRINOGEN: Fibrinogen: 483 mg/dL — ABNORMAL HIGH (ref 210–475)

## 2019-07-09 LAB — C-REACTIVE PROTEIN: CRP: 1.8 mg/dL — ABNORMAL HIGH (ref <1.0)

## 2019-07-09 MED ORDER — IRBESARTAN 150 MG PO TABS
150.0000 mg | ORAL_TABLET | Freq: Every day | ORAL | Status: DC
  Administered 2019-07-10 – 2019-07-13 (×4): 150 mg via ORAL
  Filled 2019-07-09 (×5): qty 1

## 2019-07-09 MED ORDER — FLEET ENEMA 7-19 GM/118ML RE ENEM
1.0000 | ENEMA | Freq: Once | RECTAL | Status: DC | PRN
  Filled 2019-07-09: qty 1

## 2019-07-09 MED ORDER — ASPIRIN 81 MG PO CHEW
324.0000 mg | CHEWABLE_TABLET | Freq: Once | ORAL | Status: AC
  Administered 2019-07-09: 324 mg via ORAL
  Filled 2019-07-09: qty 4

## 2019-07-09 MED ORDER — DEXAMETHASONE SODIUM PHOSPHATE 10 MG/ML IJ SOLN
6.0000 mg | INTRAMUSCULAR | Status: DC
  Administered 2019-07-10 – 2019-07-13 (×4): 6 mg via INTRAVENOUS
  Filled 2019-07-09 (×5): qty 1

## 2019-07-09 MED ORDER — SODIUM CHLORIDE 0.9 % IV SOLN
200.0000 mg | Freq: Once | INTRAVENOUS | Status: AC
  Administered 2019-07-09: 12:00:00 200 mg via INTRAVENOUS
  Filled 2019-07-09: qty 40

## 2019-07-09 MED ORDER — DEXAMETHASONE SODIUM PHOSPHATE 10 MG/ML IJ SOLN
6.0000 mg | Freq: Once | INTRAMUSCULAR | Status: AC
  Administered 2019-07-09: 05:00:00 6 mg via INTRAVENOUS
  Filled 2019-07-09: qty 1

## 2019-07-09 MED ORDER — SODIUM CHLORIDE 0.9 % IV SOLN
100.0000 mg | Freq: Every day | INTRAVENOUS | Status: AC
  Administered 2019-07-10 – 2019-07-13 (×4): 100 mg via INTRAVENOUS
  Filled 2019-07-09 (×4): qty 20

## 2019-07-09 MED ORDER — SODIUM CHLORIDE 0.9 % IV SOLN: 250.0000 mL | INTRAVENOUS | Status: DC | PRN

## 2019-07-09 MED ORDER — AEROCHAMBER PLUS FLO-VU LARGE MISC
1.0000 | Freq: Once | Status: AC
  Administered 2019-07-09: 1

## 2019-07-09 MED ORDER — ONDANSETRON HCL 4 MG PO TABS: 4.0000 mg | ORAL_TABLET | Freq: Four times a day (QID) | ORAL | Status: DC | PRN

## 2019-07-09 MED ORDER — PANTOPRAZOLE SODIUM 40 MG PO TBEC
40.0000 mg | DELAYED_RELEASE_TABLET | Freq: Every day | ORAL | Status: DC
  Administered 2019-07-09 – 2019-07-13 (×5): 40 mg via ORAL
  Filled 2019-07-09 (×6): qty 1

## 2019-07-09 MED ORDER — POLYETHYLENE GLYCOL 3350 17 G PO PACK: 17.0000 g | PACK | Freq: Every day | ORAL | Status: DC | PRN

## 2019-07-09 MED ORDER — ENOXAPARIN SODIUM 40 MG/0.4ML ~~LOC~~ SOLN
40.0000 mg | SUBCUTANEOUS | Status: DC
  Administered 2019-07-10 – 2019-07-13 (×4): 40 mg via SUBCUTANEOUS
  Filled 2019-07-09 (×5): qty 0.4

## 2019-07-09 MED ORDER — CARVEDILOL 3.125 MG PO TABS
6.2500 mg | ORAL_TABLET | Freq: Two times a day (BID) | ORAL | Status: DC
  Administered 2019-07-09 – 2019-07-13 (×9): 6.25 mg via ORAL
  Filled 2019-07-09: qty 2
  Filled 2019-07-09: qty 1
  Filled 2019-07-09 (×7): qty 2

## 2019-07-09 MED ORDER — SODIUM CHLORIDE 0.9% FLUSH
3.0000 mL | Freq: Two times a day (BID) | INTRAVENOUS | Status: DC
  Administered 2019-07-09 – 2019-07-13 (×8): 3 mL via INTRAVENOUS

## 2019-07-09 MED ORDER — GUAIFENESIN-DM 100-10 MG/5ML PO SYRP: 10.0000 mL | ORAL_SOLUTION | ORAL | Status: DC | PRN

## 2019-07-09 MED ORDER — ALBUTEROL SULFATE HFA 108 (90 BASE) MCG/ACT IN AERS
8.0000 | INHALATION_SPRAY | Freq: Once | RESPIRATORY_TRACT | Status: AC
  Administered 2019-07-09: 8 via RESPIRATORY_TRACT
  Filled 2019-07-09: qty 6.7

## 2019-07-09 MED ORDER — ASPIRIN EC 81 MG PO TBEC
81.0000 mg | DELAYED_RELEASE_TABLET | Freq: Every day | ORAL | Status: DC
  Administered 2019-07-09 – 2019-07-13 (×5): 81 mg via ORAL
  Filled 2019-07-09 (×5): qty 1

## 2019-07-09 MED ORDER — TRAMADOL HCL 50 MG PO TABS
50.0000 mg | ORAL_TABLET | Freq: Four times a day (QID) | ORAL | Status: DC | PRN
  Administered 2019-07-12: 50 mg via ORAL
  Filled 2019-07-09: qty 1

## 2019-07-09 MED ORDER — TICAGRELOR 60 MG PO TABS
60.0000 mg | ORAL_TABLET | Freq: Two times a day (BID) | ORAL | Status: DC
  Administered 2019-07-09 – 2019-07-13 (×9): 60 mg via ORAL
  Filled 2019-07-09 (×12): qty 1

## 2019-07-09 MED ORDER — ENOXAPARIN SODIUM 80 MG/0.8ML ~~LOC~~ SOLN
80.0000 mg | Freq: Once | SUBCUTANEOUS | Status: AC
  Administered 2019-07-09: 06:00:00 80 mg via SUBCUTANEOUS
  Filled 2019-07-09: qty 0.8

## 2019-07-09 MED ORDER — ONDANSETRON HCL 4 MG/2ML IJ SOLN: 4.0000 mg | Freq: Four times a day (QID) | INTRAMUSCULAR | Status: DC | PRN

## 2019-07-09 MED ORDER — UMECLIDINIUM BROMIDE 62.5 MCG/INH IN AEPB
1.0000 | INHALATION_SPRAY | Freq: Every day | RESPIRATORY_TRACT | Status: DC
  Administered 2019-07-10 – 2019-07-13 (×4): 1 via RESPIRATORY_TRACT
  Filled 2019-07-09 (×2): qty 7

## 2019-07-09 MED ORDER — HYDROCOD POLST-CPM POLST ER 10-8 MG/5ML PO SUER: 5.0000 mL | Freq: Two times a day (BID) | ORAL | Status: DC | PRN

## 2019-07-09 MED ORDER — INFLUENZA VAC SPLIT QUAD 0.5 ML IM SUSY
0.5000 mL | PREFILLED_SYRINGE | INTRAMUSCULAR | Status: DC | PRN
  Filled 2019-07-09: qty 0.5

## 2019-07-09 MED ORDER — FLUTICASONE PROPIONATE 50 MCG/ACT NA SUSP
1.0000 | Freq: Every day | NASAL | Status: DC
  Administered 2019-07-09 – 2019-07-12 (×4): 1 via NASAL
  Filled 2019-07-09 (×2): qty 16

## 2019-07-09 MED ORDER — SIMVASTATIN 20 MG PO TABS
40.0000 mg | ORAL_TABLET | Freq: Every day | ORAL | Status: DC
  Administered 2019-07-09 – 2019-07-11 (×3): 40 mg via ORAL
  Filled 2019-07-09 (×3): qty 2

## 2019-07-09 MED ORDER — ACETAMINOPHEN 325 MG PO TABS
650.0000 mg | ORAL_TABLET | Freq: Four times a day (QID) | ORAL | Status: DC | PRN
  Administered 2019-07-11 (×2): 650 mg via ORAL
  Filled 2019-07-09 (×3): qty 2

## 2019-07-09 MED ORDER — SODIUM CHLORIDE 0.9% FLUSH: 3.0000 mL | INTRAVENOUS | Status: DC | PRN

## 2019-07-09 MED ORDER — ALBUTEROL SULFATE HFA 108 (90 BASE) MCG/ACT IN AERS
2.0000 | INHALATION_SPRAY | RESPIRATORY_TRACT | Status: DC | PRN
  Filled 2019-07-09: qty 6.7

## 2019-07-09 MED ORDER — BISACODYL 5 MG PO TBEC: 5.0000 mg | DELAYED_RELEASE_TABLET | Freq: Every day | ORAL | Status: DC | PRN

## 2019-07-09 NOTE — ED Notes (Signed)
Called [facility] @ 9:45am they stated the pt is on the board and when they get a truck they will send them over to get the pt

## 2019-07-09 NOTE — ED Notes (Signed)
Pt resting in the room. RN gave pt water pitcher. Vital signs stable.

## 2019-07-09 NOTE — H&P (Signed)
History and Physical    Brenda [name] [id number] DOB: November 13, 1960 DOA: 07/09/2019  PCP: [facility] Consultants:  Fletcher Anon - cardiology; Ardis Hughs - GI; Nickel - vascular Patient coming from:  Home; NOK: Carlena Bjornstad, [phone]  Chief Complaint: SOB  HPI: Brenda [name] is a 59 y.o. female with medical history significant of RAS; PVD; HLD; HTN; CHF; carotid occlusion; and CAD presenting with SOB.  She reports that she developed symptoms last weekend including fatigue and loss of taste/smell.  She went for testing on Monday and was notified Tuesday that she was positive for COVID-19 infection. Her daughter was tested that day and was also positive.  Meanwhile, she has had intermittent fevers and progressive SOB.  She has developed cough today, but it is mild.   ED Course:  Carryover, per Dr. Marlowe Sax:  Patient with history of COPD, CHF presenting with complaints of loss of taste and smell, diarrhea, shortness of breath. She tested positive for Covid on 1/25 at an outside facility, unclear where and no records available. Her daughter also tested positive for Covid. Oxygen saturation 88% on room air, placed on 2 L supplemental oxygen. Chest x-ray concerning for pneumonia. Rapid Covid test negative, PCR test pending. Troponin 34. Patient also complained of lower back pain and dysuria, was diagnosed with a UTI yesterday and started on an antibiotic. UA has been ordered. Lovenox and Decadron administered.   Review of Systems: As per HPI; otherwise review of systems reviewed and negative.   Ambulatory Status:  Ambulates without assistance  Past Medical History:  Diagnosis Date  . Anemia   . Arthritis   . CAD (coronary artery disease)   . Cardiomyopathy   . Carotid artery occlusion   . CHF (congestive heart failure) ([place]) 2007  . COPD (chronic obstructive pulmonary disease) (HCC)    not on home o2  . HTN (hypertension)   .  Hypercholesteremia   . Pulmonary nodule   . PVD (peripheral vascular disease) ([place])   . Renal artery stenosis [facility])     Past Surgical History:  Procedure Laterality Date  . ABDOMINAL AORTOGRAM W/LOWER EXTREMITY N/A 05/11/2019   Procedure: ABDOMINAL AORTOGRAM W/LOWER EXTREMITY;  Surgeon: Wellington Hampshire, MD;  Location: [place] [facility];  Service: Cardiovascular;  Laterality: N/A;  . CARDIAC CATHETERIZATION     2007  . CHOLECYSTECTOMY N/A 03/01/2019   Procedure: LAPAROSCOPIC CHOLECYSTECTOMY;  Surgeon: Ralene Ok, MD;  Location: [place];  Service: General;  Laterality: N/A;  . CORONARY STENT PLACEMENT      Successful stenting of the right renal ostium   complicated by distal wire perforation.  The wire perforation was  treated with embolization of the branch vessels involved.  Will check CT   scan to assess the right renal parenchymal hematoma.  She will be   maintained on both aspirin and Plavix.  Will place a Foley catheter to   avoid clotting since hematuria is likely.    Marland Kitchen EXTERNAL EAR SURGERY     as a child  . PERIPHERAL VASCULAR INTERVENTION  05/11/2019   Procedure: PERIPHERAL VASCULAR INTERVENTION;  Surgeon: Wellington Hampshire, MD;  Location: [place] [facility];  Service: Cardiovascular;;  Bilater Iliac    Social History   Socioeconomic History  . Marital status: Divorced    Spouse name: Not on file  . Number of children: Not on file  . Years of education: Not on file  . Highest education level: Not on file  Occupational History  . Not on file  Tobacco Use  . Smoking status: Former Smoker    Packs/day: 1.00    Years: 31.00    Pack years: 31.00    Types: Cigarettes    Quit date: 06/09/2005    Years since quitting: 14.0  . Smokeless tobacco: Never Used  Substance and Sexual Activity  . Alcohol use: No    Alcohol/week: 0.0 standard drinks  . Drug use: No  . Sexual activity: Not Currently  Other Topics Concern  . Not on file  Social History Narrative  . Not on file     Social Determinants of Health   Financial Resource Strain:   . Difficulty of Paying Living Expenses: Not on file  Food Insecurity:   . Worried About Charity fundraiser in the Last Year: Not on file  . Ran Out of Food in the Last Year: Not on file  Transportation Needs:   . Lack of Transportation (Medical): Not on file  . Lack of Transportation (Non-Medical): Not on file  Physical Activity:   . Days of Exercise per Week: Not on file  . Minutes of Exercise per Session: Not on file  Stress:   . Feeling of Stress : Not on file  Social Connections:   . Frequency of Communication with Friends and Family: Not on file  . Frequency of Social Gatherings with Friends and Family: Not on file  . Attends Religious Services: Not on file  . Active Member of Clubs or Organizations: Not on file  . Attends Archivist Meetings: Not on file  . Marital Status: Not on file  Intimate Partner Violence:   . Fear of Current or Ex-Partner: Not on file  . Emotionally Abused: Not on file  . Physically Abused: Not on file  . Sexually Abused: Not on file    Allergies  Allergen Reactions  . Lisinopril Cough  . Oxycodone Nausea And Vomiting    Family History  Problem Relation Age of Onset  . Heart disease Mother 14       Heart Disease before age 56  . Hypertension Mother   . Hyperlipidemia Mother   . Peripheral vascular disease Mother   . Cancer Father   . Hyperlipidemia Father   . Hypertension Father   . Coronary artery disease Other     Prior to Admission medications   Medication Sig Start Date End Date Taking? Authorizing Provider  albuterol (PROVENTIL HFA;VENTOLIN HFA) 108 (90 Base) MCG/ACT inhaler Inhale 1-2 puffs into the lungs every 6 (six) hours as needed for wheezing or shortness of breath. 09/07/15  Yes Nat Christen, MD  aspirin EC 81 MG tablet Take 1 tablet (81 mg total) by mouth daily. 05/11/19 05/10/20 Yes Arida, Mertie Clause, MD  BRILINTA 60 MG TABS tablet TAKE 1 TABLET BY  MOUTH TWICE DAILY Patient taking differently: Take 60 mg by mouth 2 (two) times daily.  07/27/18  Yes Sherren Mocha, MD  candesartan (ATACAND) 16 MG tablet Take 1 tablet (16 mg total) by mouth daily. Please make annual appt with Dr. Burt Knack for refills. [id number]. 06/21/19  Yes Sherren Mocha, MD  carvedilol (COREG) 6.25 MG tablet TAKE 1 TABLET BY MOUTH TWICE DAILY WITH MEALS Patient taking differently: Take 6.25 mg by mouth 2 (two) times daily.  03/29/19  Yes Wellington Hampshire, MD  fluticasone (FLONASE) 50 MCG/ACT nasal spray Place 1 spray into both nostrils at bedtime. 07/05/19  Yes [provider]  omeprazole (PRILOSEC) 40 MG  capsule Take 1 capsule (40 mg total) by mouth daily. Take daily before breakfast or lunch 09/01/18  Yes Milus Banister, MD  simvastatin (ZOCOR) 40 MG tablet Take 1 tablet (40 mg total) by mouth daily at 6 PM. Patient taking differently: Take 40 mg by mouth at bedtime.  04/19/19  Yes Wellington Hampshire, MD  sulfamethoxazole-trimethoprim (BACTRIM DS) 800-160 MG tablet Take 1 tablet by mouth 2 (two) times daily. 07/08/19  Yes [provider]  tiotropium (SPIRIVA) 18 MCG inhalation capsule Place 18 mcg into inhaler and inhale daily.    Yes [provider]    Physical Exam: Vitals:   07/09/19 0700 07/09/19 0801 07/09/19 1036 07/09/19 1326  BP: (!) 141/48 128/68 (!) 147/68 (!) 164/64  Pulse: 72 74 65   Resp: (!) 22 19 17    Temp:      TempSrc:      SpO2: 96% 98% 99% 94%  Weight:    81.8 kg  Height:    5\' 4"  (1.626 m)     . General:  Appears ill but is in NAD . Eyes:  PERRL, EOMI, normal lids, iris . ENT:  grossly normal hearing, lips & tongue, mmm; appropriate dentition . Neck:  no LAD, masses or thyromegaly . Cardiovascular:  RRR, no m/r/g. No LE edema.  Marland Kitchen Respiratory:   Scattered rhonchi.  Normal to mildly increased respiratory effort. . Abdomen:  soft, NT, ND, NABS . Back:   normal alignment, no CVAT . Skin:  no rash or induration  seen on limited exam . Musculoskeletal:  grossly normal tone BUE/BLE, good ROM, no bony abnormality . Psychiatric: blunted mood and affect, speech fluent and appropriate, AOx3 . Neurologic:  CN 2-12 grossly intact, moves all extremities in coordinated fashion    Radiological Exams on Admission: DG Chest Portable 1 View  Result Date: 07/09/2019 CLINICAL DATA:  Shortness of breath. COVID positive. EXAM: PORTABLE CHEST 1 VIEW COMPARISON:  09/07/2015 FINDINGS: Heart is normal in size. Aortic atherosclerosis. Apical predominant emphysema. Minimal streaky opacity at the left lung base. Possible small pleural effusions. No pneumothorax. No acute osseous abnormalities. IMPRESSION: 1. Minimal streaky left lung base opacity, may be atelectasis or pneumonia involving of COVID-19. 2. Possible small pleural effusions. 3. Emphysema. Aortic Atherosclerosis (ICD10-I70.0) and Emphysema (ICD10-J43.9). Electronically Signed   By: Keith Rake M.D.   On: 07/09/2019 04:01    EKG: Independently reviewed.  NSR with rate 78; nonspecific ST changes with no evidence of acute ischemia   Labs on Admission: I have personally reviewed the available labs and imaging studies at the time of the admission.  Pertinent labs:   CO2 21 Glucose 105 LDH 173 Ferritin 210 CRP 1.8 Procalcitonin 0.28 D-dimer 0.54 Fibrinogen  483 Normal CBC HS troponin 34, 35  COVID POSITIVE HIV negative  Assessment/Plan Principal Problem:   Acute respiratory failure with hypoxia (HCC) Active Problems:   HYPERCHOLESTEROLEMIA   Essential hypertension   Coronary atherosclerosis   COPD (chronic obstructive pulmonary disease) (HCC)   Hyperglycemia   Chronic diastolic CHF (congestive heart failure) (HCC)   Acute respiratory disease due to COVID-19 virus   Obesity (BMI 30.0-34.9)   Acute respiratory failure with hypoxia due to COVID-19 infection -Patient with presenting with SOB and reported fever at home; also with recent cough.    -Tested positive for COVID-19 on 1/26 by report (result not available) -Mild diarrhea/anorexia/loss of taste and smell noted -She does not have a usual home O2 requirement and is currently requiring 2L [place]  O2 -COVID POSITIVE -The patient has comorbidities which may increase the risk for ARDS/MODS including: HTN, obesity, COPD -Pertinent labs concerning for COVID include normal WBC count;  elevated D-dimer (not >1); increased fibrinogen -CXR with multifocal opacities which may be c/w COVID vs. Multifocal PNA  -Will not treat with broad-spectrum antibiotics given procalcitonin <0.3 -Will admit to [facility] for further evaluation, close monitoring, and treatment -Monitor on telemetry x at least 24 hours -At this time, will attempt to avoid use of aerosolized medications and use HFAs instead -Will check daily labs including BMP with Mag, Phos; LFTs; CBC with differential; CRP; ferritin; fibrinogen; D-dimer  -Will order steroids and Remdesivir (pharmacy consult) given +COVID test, +CXR, and hypoxia <94% on room air -If the patient shows clinical deterioration, consider transfer to ICU with PCCM consultation -She does not appear to need Tocilizumab or convalescent plasma at this time -Will attempt to maintain euvolemia to a net negative fluid status -Will ask the patient to maintain an awake prone position for 16+ hours a day, if possible, with a minimum of 2-3 hours at a time -With D-dimer <5, will use standard-dosed Lovenox for DVT prevention -Patient was seen wearing full PPE including: gown, gloves, head cover, N95, and face shield; donning and doffing was in compliance with current standards.  HTN -Continue ARB (formulary substitution)  Chronic diastolic CHF -[id number] echo with preserved EF and grade 1 diastolic dysfunction -Appears to be compensated at this time  ASCVD -s/p renal artery stenting in 2008 with perforation treated by coiling -Subsequent in-stent restenosis that was treated with  stenting -also s/p CEA and R carotid to subclavian artery bypass -Also with B common iliac artery stents -Followed by Dr. Fletcher Anon -Continue ASA, Brilinta  COPD -Remote tobacco history -Continue Spiriva -No apparent exacerbation on admission  HLD -Continue Zocor  Obesity -BMI 31 -Weight loss should be encouraged  Hyperglycemia -May be stress response -Will follow with fasting AM labs and check A1c -Steroids are likely to make this worse   DVT prophylaxis:  Lovenox  Code Status:  Full - confirmed with patient Family Communication: None present; I spoke with the patient's daughter by telephone. Disposition Plan:  Home once clinically improved Consults called: None  Admission status: Admit - It is my clinical opinion that admission to INPATIENT is reasonable and necessary because of the expectation that this patient will require hospital care that crosses at least 2 midnights to treat this condition based on the medical complexity of the problems presented.  Given the aforementioned information, the predictability of an adverse outcome is felt to be significant.     Karmen Bongo MD [facility]   How to contact the [facility] Attending or Consulting provider [place] or covering provider during after hours [place], for this patient?  1. Check the care team in [facility] and look for a) attending/consulting TRH provider listed and b) the [facility] team listed 2. Log into [email or web address] and use [place]'s universal password to access. If you do not have the password, please contact the hospital operator. 3. Locate the [facility] provider you are looking for under [facility] and page to a number that you can be directly reached. 4. If you still have difficulty reaching the provider, please page the [facility] (Director on Call) for the Hospitalists listed on amion for assistance.   07/09/2019, 1:40 PM

## 2019-07-09 NOTE — ED Provider Notes (Signed)
[facility] EMERGENCY DEPARTMENT Provider Note   CSN: [id number] Arrival date & time: 07/09/19  8546     History Chief Complaint  Patient presents with  . Shortness of Breath    Brenda [name] is a 59 y.o. female.  The history is provided by the patient and the EMS personnel. The history is limited by the condition of the patient.  Shortness of Breath Severity:  Severe Onset quality:  Gradual Duration:  2 days Timing:  Constant Progression:  Worsening Chronicity:  New Context: URI   Relieved by:  Nothing Worsened by:  Nothing Ineffective treatments:  Inhaler Associated symptoms: cough   Associated symptoms: no abdominal pain, no chest pain, no fever, no sputum production and no wheezing   Risk factors: no recent surgery   Patient with COPD and CHF was reportedly diagnosed with covid on 1/25.  She has lost taste and smell.  She has also had diarrhea.  Was diagnosed with UTI yesterday.       Past Medical History:  Diagnosis Date  . Anemia   . Arthritis   . CAD (coronary artery disease)   . Cardiomyopathy   . Carotid artery occlusion   . CHF (congestive heart failure) ([place]) 2007  . COPD (chronic obstructive pulmonary disease) (H[facility])    not on home o2  . HTN (hypertension)   . Hypercholesteremia   . Pulmonary nodule   . PVD (peripheral vascular disease) ([place])   . Renal artery stenosis [facility])     Patient Active Problem List   Diagnosis Date Noted  . Chronic diastolic CHF (congestive heart failure) ([place]) 11/02/2014  . Acute respiratory failure with hypoxia ([place]) 11/01/2014  . COPD exacerbation ([place]) 10/31/2014  . Hyperglycemia 10/31/2014  . Polycythemia 10/31/2014  . Strep throat   . Research subject 10/10/2011  . ANEMIA, CHRONIC 11/03/2008  . HYPERTENSION 11/03/2008  . CAD 11/03/2008  . CAROTID ARTERY OCCLUSION 11/03/2008  . PULMONARY NODULE, SOLITARY 11/03/2008  . HYPERCHOLESTEROLEMIA 10/20/2008  . CARDIOMYOPATHY 10/20/2008  . COPD  10/20/2008  . RENAL ARTERY STENOSIS 10/09/2008  . Atherosclerosis of native arteries of the extremities with intermittent claudication 10/09/2008    Past Surgical History:  Procedure Laterality Date  . ABDOMINAL AORTOGRAM W/LOWER EXTREMITY N/A 05/11/2019   Procedure: ABDOMINAL AORTOGRAM W/LOWER EXTREMITY;  Surgeon: Wellington Hampshire, MD;  Location: [facility];  Service: Cardiovascular;  Laterality: N/A;  . CARDIAC CATHETERIZATION     2007  . CHOLECYSTECTOMY N/A 03/01/2019   Procedure: LAPAROSCOPIC CHOLECYSTECTOMY;  Surgeon: Ralene Ok, MD;  Location: [facility];  Service: General;  Laterality: N/A;  . CORONARY STENT PLACEMENT      Successful stenting of the right renal ostium   complicated by distal wire perforation.  The wire perforation was  treated with embolization of the branch vessels involved.  Will check CT   scan to assess the right renal parenchymal hematoma.  She will be   maintained on both aspirin and Plavix.  Will place a Foley catheter to   avoid clotting since hematuria is likely.    Marland Kitchen EXTERNAL EAR SURGERY     as a child  . PERIPHERAL VASCULAR INTERVENTION  05/11/2019   Procedure: PERIPHERAL VASCULAR INTERVENTION;  Surgeon: Wellington Hampshire, MD;  Location: [facility];  Service: Cardiovascular;;  Bilater Iliac     OB History   No obstetric history on file.     Family History  Problem Relation Age of Onset  . Heart disease  Mother 53       Heart Disease before age 60  . Hypertension Mother   . Hyperlipidemia Mother   . Peripheral vascular disease Mother   . Cancer Father   . Hyperlipidemia Father   . Hypertension Father   . Coronary artery disease Other     Social History   Tobacco Use  . Smoking status: Former Smoker    Packs/day: 1.00    Years: 31.00    Pack years: 31.00    Types: Cigarettes    Quit date: 06/09/2005    Years since quitting: 14.0  . Smokeless tobacco: Never Used  Substance Use Topics  . Alcohol use: No    Alcohol/week:  0.0 standard drinks  . Drug use: No    Home Medications Prior to Admission medications   Medication Sig Start Date End Date Taking? Authorizing Provider  albuterol (PROVENTIL HFA;VENTOLIN HFA) 108 (90 Base) MCG/ACT inhaler Inhale 1-2 puffs into the lungs every 6 (six) hours as needed for wheezing or shortness of breath. 09/07/15   Cook, Brian, MD  aspirin EC 81 MG tablet Take 1 tablet (81 mg total) by mouth daily. 05/11/19 05/10/20  Arida, Muhammad A, MD  BRILINTA 60 MG TABS tablet TAKE 1 TABLET BY MOUTH TWICE DAILY Patient taking differently: Take 60 mg by mouth 2 (two) times daily.  07/27/18   Cooper, Michael, MD  candesartan (ATACAND) 16 MG tablet Take 1 tablet (16 mg total) by mouth daily. Please make annual appt with Dr. Cooper for refills. [phone]. 06/21/19   Cooper, Michael, MD  carvedilol (COREG) 6.25 MG tablet TAKE 1 TABLET BY MOUTH TWICE DAILY WITH MEALS Patient taking differently: Take 6.25 mg by mouth 2 (two) times daily.  03/29/19   Arida, Muhammad A, MD  omeprazole (PRILOSEC) 40 MG capsule Take 1 capsule (40 mg total) by mouth daily. Take daily before breakfast or lunch 09/01/18   Jacobs, Daniel P, MD  simvastatin (ZOCOR) 40 MG tablet Take 1 tablet (40 mg total) by mouth daily at 6 PM. 04/19/19   Arida, Muhammad A, MD  tiotropium (SPIRIVA) 18 MCG inhalation capsule Place 18 mcg into inhaler and inhale daily.     [provider]    Allergies    Lisinopril and Oxycodone  Review of Systems   Review of Systems  Constitutional: Negative for fever.  HENT: Negative for congestion.   Eyes: Negative for visual disturbance.  Respiratory: Positive for cough and shortness of breath. Negative for sputum production and wheezing.   Cardiovascular: Negative for chest pain, palpitations and leg swelling.  Gastrointestinal: Negative for abdominal pain.  Genitourinary: Positive for dysuria.  Musculoskeletal: Negative for back pain.  Neurological: Negative for dizziness.    Psychiatric/Behavioral: Negative for agitation. The patient is nervous/anxious.     Physical Exam Updated Vital Signs BP (!) 166/67   Pulse 79   Temp 99 F (37.2 C) (Oral)   Resp 13   SpO2 (!) 88%   Physical Exam Vitals and nursing note reviewed.  Constitutional:      Appearance: She is not diaphoretic.  HENT:     Head: Normocephalic and atraumatic.     Nose: Nose normal.  Eyes:     Conjunctiva/sclera: Conjunctivae normal.     Pupils: Pupils are equal, round, and reactive to light.  Cardiovascular:     Rate and Rhythm: Normal rate and regular rhythm.     Pulses: Normal pulses.     Heart sounds: Normal heart sounds.  Pulmonary:       Effort: No accessory muscle usage.     Breath sounds: Decreased air movement present. No wheezing or rales.  Abdominal:     General: Abdomen is flat. Bowel sounds are normal.     Tenderness: There is no abdominal tenderness. There is no guarding or rebound.  Musculoskeletal:        General: Normal range of motion.     Cervical back: Normal range of motion and neck supple.  Skin:    General: Skin is warm and dry.     Capillary Refill: Capillary refill takes less than 2 seconds.  Neurological:     General: No focal deficit present.     Mental Status: She is alert and oriented to person, place, and time.     Deep Tendon Reflexes: Reflexes normal.  Psychiatric:        Mood and Affect: Mood normal.        Behavior: Behavior normal.     ED Results / Procedures / Treatments   Labs (all labs ordered are listed, but only abnormal results are displayed) Results for orders placed or performed during the hospital encounter of 07/09/19  CBC with Differential/Platelet  Result Value Ref Range   WBC 5.5 4.0 - 10.5 K/uL   RBC 4.77 3.87 - 5.11 MIL/uL   Hemoglobin 14.6 12.0 - 15.0 g/dL   HCT 44.2 36.0 - 46.0 %   MCV 92.7 80.0 - 100.0 fL   MCH 30.6 26.0 - 34.0 pg   MCHC 33.0 30.0 - 36.0 g/dL   RDW 12.9 11.5 - 15.5 %   Platelets 191 150 - 400 K/uL    nRBC 0.0 0.0 - 0.2 %   Neutrophils Relative % 73 %   Neutro Abs 4.0 1.7 - 7.7 K/uL   Lymphocytes Relative 17 %   Lymphs Abs 0.9 0.7 - 4.0 K/uL   Monocytes Relative 10 %   Monocytes Absolute 0.6 0.1 - 1.0 K/uL   Eosinophils Relative 0 %   Eosinophils Absolute 0.0 0.0 - 0.5 K/uL   Basophils Relative 0 %   Basophils Absolute 0.0 0.0 - 0.1 K/uL   Immature Granulocytes 0 %   Abs Immature Granulocytes 0.02 0.00 - 0.07 K/uL  POC SARS Coronavirus 2 Ag-ED - Nasal Swab (BD Veritor Kit)  Result Value Ref Range   SARS Coronavirus 2 Ag NEGATIVE NEGATIVE  I-stat chem 8, ED (not at [facility] or [facility])  Result Value Ref Range   Sodium 138 135 - 145 mmol/L   Potassium 3.4 (L) 3.5 - 5.1 mmol/L   Chloride 105 98 - 111 mmol/L   BUN 7 6 - 20 mg/dL   Creatinine, Ser 0.70 0.44 - 1.00 mg/dL   Glucose, Bld 91 70 - 99 mg/dL   Calcium, Ion 1.09 (L) 1.15 - 1.40 mmol/L   TCO2 23 22 - 32 mmol/L   Hemoglobin 14.6 12.0 - 15.0 g/dL   HCT 43.0 36.0 - 46.0 %  Troponin I (High Sensitivity)  Result Value Ref Range   Troponin I (High Sensitivity) 34 (H) <18 ng/L   DG Chest Portable 1 View  Result Date: 07/09/2019 CLINICAL DATA:  Shortness of breath. COVID positive. EXAM: PORTABLE CHEST 1 VIEW COMPARISON:  09/07/2015 FINDINGS: Heart is normal in size. Aortic atherosclerosis. Apical predominant emphysema. Minimal streaky opacity at the left lung base. Possible small pleural effusions. No pneumothorax. No acute osseous abnormalities. IMPRESSION: 1. Minimal streaky left lung base opacity, may be atelectasis or pneumonia involving of COVID-19. 2. Possible small pleural effusions.   3. Emphysema. Aortic Atherosclerosis (ICD10-I70.0) and Emphysema (ICD10-J43.9). Electronically Signed   By: Keith Rake M.D.   On: 07/09/2019 04:01    EKG EKG Interpretation  Date/Time:  Saturday July 09 2019 03:48:32 EST Ventricular Rate:  78 PR Interval:    QRS Duration: 153 QT Interval:  468 QTC Calculation: 534 R Axis:   55 Text  Interpretation: Sinus rhythm Prolonged PR interval IVCD, consider atypical LBBB Confirmed by Randal Buba, Tresean Mattix ([id number]) on 07/09/2019 3:59:31 AM   Radiology DG Chest Portable 1 View  Result Date: 07/09/2019 CLINICAL DATA:  Shortness of breath. COVID positive. EXAM: PORTABLE CHEST 1 VIEW COMPARISON:  09/07/2015 FINDINGS: Heart is normal in size. Aortic atherosclerosis. Apical predominant emphysema. Minimal streaky opacity at the left lung base. Possible small pleural effusions. No pneumothorax. No acute osseous abnormalities. IMPRESSION: 1. Minimal streaky left lung base opacity, may be atelectasis or pneumonia involving of COVID-19. 2. Possible small pleural effusions. 3. Emphysema. Aortic Atherosclerosis (ICD10-I70.0) and Emphysema (ICD10-J43.9). Electronically Signed   By: Keith Rake M.D.   On: 07/09/2019 04:01    Procedures Procedures (including critical care time)  Medications Ordered in ED Medications  enoxaparin (LOVENOX) injection 80 mg (has no administration in time range)  albuterol (VENTOLIN HFA) 108 (90 Base) MCG/ACT inhaler 8 puff (8 puffs Inhalation Given 07/09/19 0420)  AeroChamber Plus Flo-Vu Large MISC 1 each (1 each Other Given 07/09/19 0420)  dexamethasone (DECADRON) injection 6 mg (6 mg Intravenous Given 07/09/19 0515)  aspirin chewable tablet 324 mg (324 mg Oral Given 07/09/19 0515)    ED Course  I have reviewed the triage vital signs and the nursing notes.  Pertinent labs & imaging results that were available during my care of the patient were reviewed by me and considered in my medical decision making (see chart for details).   I do not think this is a PE. I suspect there is a component of this that is the patient's COPD as she is not using her inhaler at home.  There was no chest pain.  This has been progressive.  I have covered the patient given the troponin with ASA, I have also started anticoagulation.    Will need admission for hypoxia with COVID 19  Final  Clinical Impression(s) / ED Diagnoses Admit to medicine for covid 19 and elevated troponin.     Makailah Slavick, MD 07/09/19 [phone]

## 2019-07-09 NOTE — Progress Notes (Signed)
Pharmacy Medication Storage Note  Storing home medications for [name] in pharmacy secured storage.   Medication storage bag number: [id number]  Delivered to pharmacy @ 1520 by Perlie Gold  Medications will be returned to patient/caregiver upon discharge.  Efraim Kaufmann, PharmD, BCPS 07/09/19 3:23 PM

## 2019-07-10 DIAGNOSIS — I5032 Chronic diastolic (congestive) heart failure: Secondary | ICD-10-CM

## 2019-07-10 DIAGNOSIS — I10 Essential (primary) hypertension: Secondary | ICD-10-CM

## 2019-07-10 LAB — COMPREHENSIVE METABOLIC PANEL
ALT: 17 U/L (ref 0–44)
AST: 23 U/L (ref 15–41)
Albumin: 3.6 g/dL (ref 3.5–5.0)
Alkaline Phosphatase: 68 U/L (ref 38–126)
Anion gap: 9 (ref 5–15)
BUN: 15 mg/dL (ref 6–20)
CO2: 23 mmol/L (ref 22–32)
Calcium: 9 mg/dL (ref 8.9–10.3)
Chloride: 107 mmol/L (ref 98–111)
Creatinine, Ser: 0.88 mg/dL (ref 0.44–1.00)
GFR calc Af Amer: >60 mL/min (ref >60)
GFR calc non Af Amer: >60 mL/min (ref >60)
Glucose, Bld: 117 mg/dL — ABNORMAL HIGH (ref 70–99)
Potassium: 3.5 mmol/L (ref 3.5–5.1)
Sodium: 139 mmol/L (ref 135–145)
Total Bilirubin: 0.1 mg/dL — ABNORMAL LOW (ref 0.3–1.2)
Total Protein: 7.3 g/dL (ref 6.5–8.1)

## 2019-07-10 LAB — CBC WITH DIFFERENTIAL/PLATELET
Abs Immature Granulocytes: 0.01 10*3/uL (ref 0.00–0.07)
Basophils Absolute: 0 10*3/uL (ref 0.0–0.1)
Basophils Relative: 0 %
Eosinophils Absolute: 0 10*3/uL (ref 0.0–0.5)
Eosinophils Relative: 0 %
HCT: 42.4 % (ref 36.0–46.0)
Hemoglobin: 13.9 g/dL (ref 12.0–15.0)
Immature Granulocytes: 0 %
Lymphocytes Relative: 26 %
Lymphs Abs: 1.1 10*3/uL (ref 0.7–4.0)
MCH: 30.3 pg (ref 26.0–34.0)
MCHC: 32.8 g/dL (ref 30.0–36.0)
MCV: 92.4 fL (ref 80.0–100.0)
Monocytes Absolute: 0.7 10*3/uL (ref 0.1–1.0)
Monocytes Relative: 16 %
Neutro Abs: 2.4 10*3/uL (ref 1.7–7.7)
Neutrophils Relative %: 58 %
Platelets: 210 10*3/uL (ref 150–400)
RBC: 4.59 MIL/uL (ref 3.87–5.11)
RDW: 13.1 % (ref 11.5–15.5)
WBC: 4.1 10*3/uL (ref 4.0–10.5)
nRBC: 0 % (ref 0.0–0.2)

## 2019-07-10 LAB — D-DIMER, QUANTITATIVE: D-Dimer, Quant: 0.46 ug/mL-FEU (ref 0.00–0.50)

## 2019-07-10 LAB — C-REACTIVE PROTEIN: CRP: 1.4 mg/dL — ABNORMAL HIGH (ref <1.0)

## 2019-07-10 LAB — FERRITIN: Ferritin: 248 ng/mL (ref 11–307)

## 2019-07-10 LAB — PHOSPHORUS: Phosphorus: 1.9 mg/dL — ABNORMAL LOW (ref 2.5–4.6)

## 2019-07-10 LAB — MAGNESIUM: Magnesium: 1.9 mg/dL (ref 1.7–2.4)

## 2019-07-10 MED ORDER — K PHOS MONO-SOD PHOS DI & MONO 155-852-130 MG PO TABS
500.0000 mg | ORAL_TABLET | Freq: Two times a day (BID) | ORAL | Status: AC
  Administered 2019-07-10 – 2019-07-11 (×4): 500 mg via ORAL
  Filled 2019-07-10 (×5): qty 2

## 2019-07-10 NOTE — Progress Notes (Signed)
Pt Brenda [name] has been on 2L O2 most of the day. Pt sat up in chair and walked around the unit. Pt was able to tolerate the walk and did not destat. When returned to the room O2 was titrated to 1L.

## 2019-07-10 NOTE — Progress Notes (Signed)
PROGRESS NOTE                                                                                                                                                                                                             Patient Demographics:    Brenda [name], is a 59 y.o. female, DOB - [birth year], LK:[id number]  Admit date - 07/09/2019   Admitting Physician Shela Leff, MD  Outpatient Primary MD for the patient is [facility]  LOS - 1   Chief Complaint  Patient presents with  . Shortness of Breath       Brief Narrative     59 y.o. female with medical history significant of RAS; PVD; HLD; HTN; CHF; carotid occlusion; and CAD presenting with SOB.  She reports that she developed symptoms last weekend including fatigue and loss of taste/smell.  She went for testing on Monday and was notified Tuesday that she was positive for COVID-19 infection. Her daughter was tested that day and was also positive.  Meanwhile, she has had intermittent fevers and progressive SOB.  She has developed cough today, but it is mild.    Subjective:    Lupe Materna today reports some dyspnea, cough, [place] any chest pain.   Assessment  & Plan :    Principal Problem:   Acute respiratory failure with hypoxia (HCC) Active Problems:   HYPERCHOLESTEROLEMIA   Essential hypertension   Coronary atherosclerosis   COPD (chronic obstructive pulmonary disease) (HCC)   Hyperglycemia   Chronic diastolic CHF (congestive heart failure) (HCC)   Acute respiratory disease due to COVID-19 virus   Obesity (BMI 30.0-34.9)  Acute respiratory failure with hypoxia due to COVID-19 infection -Patient with presenting with SOB and reported fever at home; also with recent cough.  Reports that she was desaturated to 88% on room air in ED, initially requiring 2 L nasal cannula, this morning she is on room air. -Continue with IV steroids. -Continue with IV  remdesivir. -Continue to trend inflammatory markers, reassuringly they are on the lower side -I see no indication for Actemra -Patient high risk for worsening respiratory failure due to COVID-19 with known history of hypertension, diabetes and PAD   COVID-19 Labs  Recent Labs    07/09/19 0840 07/10/19 0050  DDIMER 0.54* 0.46  FERRITIN 210 248  LDH 173  --  CRP 1.8* 1.4*    Lab Results  Component Value Date   [place] (A) 07/09/2019   [place] NOT DETECTED 05/07/2019   [place] NOT DETECTED 02/25/2019     HTN -Continue ARB (formulary substitution)  Chronic diastolic CHF -[id number] echo with preserved EF and grade 1 diastolic dysfunction -Appears to be compensated at this time  ASCVD -s/p renal artery stenting in 2008 with perforation treated by coiling -Subsequent in-stent restenosis that was treated with stenting -also s/p CEA and R carotid to subclavian artery bypass -Also with B common iliac artery stents -Followed by Dr. Fletcher Anon -Continue ASA, Brilinta  COPD -Remote tobacco history -Continue Spiriva -No apparent exacerbation on admission  HLD -Continue Zocor  Obesity -BMI 31 -Weight loss should be encourage  Hyperglycemia -May be stress response -A1c is 5.9, this is most likely related to Covid and steroids, blood glucose uncontrolled and morning labs.   Code Status : Full code  Family Communication  : Cussed with patient  Disposition Plan  : Home once stable  Barriers For Discharge : Patient remains on IV steroids, IV remdesivir  Consults  :  None  Procedures  : none  DVT Prophylaxis  :  [place] lovenox  Lab Results  Component Value Date   PLT 210 07/10/2019    Antibiotics  :    Anti-infectives (From admission, onward)   Start     Dose/Rate Route Frequency Ordered Stop   07/10/19 1000  remdesivir 100 mg in sodium chloride 0.9 % 100 mL IVPB     100 mg 200 mL/hr over 30 Minutes Intravenous Daily 07/09/19 1138 07/14/19  0959   07/09/19 1145  remdesivir 200 mg in sodium chloride 0.9% 250 mL IVPB     200 mg 580 mL/hr over 30 Minutes Intravenous Once 07/09/19 1138 07/09/19 1241        Objective:   Vitals:   07/10/19 0600 07/10/19 0647 07/10/19 0719 07/10/19 1013  BP:   (!) 154/75   Pulse: (!) 53 (!) 49  63  Resp: 17 18  20   Temp:   98 F (36.7 C)   TempSrc:   Oral   SpO2: 93% 95%  92%  Weight:      Height:        Wt Readings from Last 3 Encounters:  07/09/19 81.8 kg  06/14/19 83 kg  05/11/19 83.5 kg     Intake/Output Summary (Last 24 hours) at 07/10/2019 1423 Last data filed at 07/10/2019 0400 Gross per 24 hour  Intake 696 ml  Output --  Net 696 ml     Physical Exam  Awake Alert, Oriented X 3, No new F.N deficits, Normal affect Symmetrical Chest wall movement, Good air movement bilaterally, CTAB RRR,No Gallops,Rubs or new Murmurs, No Parasternal Heave +ve B.Sounds, Abd Soft, No tenderness,  No rebound - guarding or rigidity. No Cyanosis, Clubbing or edema, No new Rash or bruise     Data Review:    CBC Recent Labs  Lab 07/09/19 0355 07/09/19 0400 07/10/19 0050  WBC 5.5  --  4.1  HGB 14.6 14.6 13.9  HCT 44.2 43.0 42.4  PLT 191  --  210  MCV 92.7  --  92.4  MCH 30.6  --  30.3  MCHC 33.0  --  32.8  RDW 12.9  --  13.1  LYMPHSABS 0.9  --  1.1  MONOABS 0.6  --  0.7  EOSABS 0.0  --  0.0  BASOSABS 0.0  --  0.0  Chemistries  Recent Labs  Lab 07/09/19 0400 07/09/19 0840 07/10/19 0050  NA 138 135 139  K 3.4* 3.7 3.5  CL 105 102 107  CO2  --  21* 23  GLUCOSE 91 105* 117*  BUN 7 9 15   CREATININE 0.70 0.96 0.88  CALCIUM  --  8.7* 9.0  MG  --   --  1.9  AST  --  28 23  ALT  --  20 17  ALKPHOS  --  73 68  BILITOT  --  0.4 0.1*   ------------------------------------------------------------------------------------------------------------------ No results for input(s): CHOL, HDL, LDLCALC, TRIG, CHOLHDL, LDLDIRECT in the last 72 hours.  Lab Results  Component  Value Date   HGBA1C 5.6 07/09/2019   ------------------------------------------------------------------------------------------------------------------ No results for input(s): TSH, T4TOTAL, T3FREE, THYROIDAB in the last 72 hours.  Invalid input(s): FREET3 ------------------------------------------------------------------------------------------------------------------ Recent Labs    07/09/19 0840 07/10/19 0050  FERRITIN 210 248    Coagulation profile No results for input(s): INR, PROTIME in the last 168 hours.  Recent Labs    07/09/19 0840 07/10/19 0050  DDIMER 0.54* 0.46    Cardiac Enzymes No results for input(s): CKMB, TROPONINI, MYOGLOBIN in the last 168 hours.  Invalid input(s): CK ------------------------------------------------------------------------------------------------------------------    Component Value Date/Time   BNP 79.6 10/31/2014 0023    Inpatient Medications  Scheduled Meds: . aspirin EC  81 mg Oral Daily  . carvedilol  6.25 mg Oral BID  . dexamethasone (DECADRON) injection  6 mg Intravenous Q24H  . enoxaparin (LOVENOX) injection  40 mg Subcutaneous Q24H  . fluticasone  1 spray Each Nare QHS  . irbesartan  150 mg Oral Daily  . pantoprazole  40 mg Oral Daily  . phosphorus  500 mg Oral BID  . simvastatin  40 mg Oral QHS  . sodium chloride flush  3 mL Intravenous Q12H  . ticagrelor  60 mg Oral BID  . umeclidinium bromide  1 puff Inhalation Daily   Continuous Infusions: . sodium chloride    . remdesivir 100 mg in NS 100 mL Stopped (07/10/19 0839)   PRN Meds:.sodium chloride, acetaminophen, albuterol, bisacodyl, chlorpheniramine-HYDROcodone, guaiFENesin-dextromethorphan, influenza vac split quadrivalent PF, ondansetron **OR** ondansetron (ZOFRAN) IV, polyethylene glycol, sodium chloride flush, sodium phosphate, traMADol  Micro Results Recent Results (from the past 240 hour(s))  Respiratory Panel by RT PCR (Flu A&B, Covid) - Nasopharyngeal  Swab     Status: Abnormal   Collection Time: 07/09/19  5:15 AM   Specimen: Nasopharyngeal Swab  Result Value Ref Range Status   SARS Coronavirus 2 by RT PCR POSITIVE (A) NEGATIVE Final    Comment: RESULT CALLED TO, READ BACK BY AND VERIFIED WITH: P PEICKERT ON [id number] AT 0700 BY NFIELDS (NOTE) SARS-CoV-2 target nucleic acids are DETECTED. SARS-CoV-2 RNA is generally detectable in upper respiratory specimens  during the acute phase of infection. Positive results are indicative of the presence of the identified virus, but do not rule out bacterial infection or co-infection with other pathogens not detected by the test. Clinical correlation with patient history and other diagnostic information is necessary to determine patient infection status. The expected result is Negative. Fact Sheet for Patients:  PinkCheek.be Fact Sheet for Healthcare Providers: GravelBags.it This test is not yet approved or cleared by the [place] FDA and  has been authorized for detection and/or diagnosis of SARS-CoV-2 by FDA under an Emergency Use Authorization (EUA).  This EUA will remain in effect (meaning this test can be use d) for the duration of  the  COVID-19 declaration under Section 564(b)(1) of the Act, 21 U.S.C. section 360bbb-3(b)(1), unless the authorization is terminated or revoked sooner.    Influenza A by PCR NEGATIVE NEGATIVE Final   Influenza B by PCR NEGATIVE NEGATIVE Final    Comment: (NOTE) The Xpert Xpress SARS-CoV-2/FLU/RSV assay is intended as an aid in  the diagnosis of influenza from Nasopharyngeal swab specimens and  should not be used as a sole basis for treatment. Nasal washings and  aspirates are unacceptable for Xpert Xpress SARS-CoV-2/FLU/RSV  testing. Fact Sheet for Patients: PinkCheek.be Fact Sheet for Healthcare Providers: GravelBags.it This test is not  yet approved or cleared by the [place] FDA and  has been authorized for detection and/or diagnosis of SARS-CoV-2 by  FDA under an Emergency Use Authorization (EUA). This EUA will remain  in effect (meaning this test can be used) for the duration of the  Covid-19 declaration under Section 564(b)(1) of the Act, 21  U.S.C. section 360bbb-3(b)(1), unless the authorization is  terminated or revoked. Performed at [facility], [place] [address], [place], [place] [address]     Radiology Reports DG Chest Portable 1 View  Result Date: 07/09/2019 CLINICAL DATA:  Shortness of breath. COVID positive. EXAM: PORTABLE CHEST 1 VIEW COMPARISON:  09/07/2015 FINDINGS: Heart is normal in size. Aortic atherosclerosis. Apical predominant emphysema. Minimal streaky opacity at the left lung base. Possible small pleural effusions. No pneumothorax. No acute osseous abnormalities. IMPRESSION: 1. Minimal streaky left lung base opacity, may be atelectasis or pneumonia involving of COVID-19. 2. Possible small pleural effusions. 3. Emphysema. Aortic Atherosclerosis (ICD10-I70.0) and Emphysema (ICD10-J43.9). Electronically Signed   By: Keith Rake M.D.   On: 07/09/2019 04:01     Phillips Climes M.D on 07/10/2019 at 2:23 PM  Between 7am to 7pm - Pager - [phone]  After 7pm go to [email or web address] - password [facility]  [facility] -  Office  [phone]

## 2019-07-10 NOTE — Plan of Care (Signed)
  Problem: Education: Goal: Knowledge of risk factors and measures for prevention of condition will improve 07/10/2019 1746 by Junita Push, RN Outcome: Progressing 07/10/2019 1746 by Junita Push, RN Outcome: Progressing   Problem: Coping: Goal: Psychosocial and spiritual needs will be supported 07/10/2019 1746 by Junita Push, RN Outcome: Progressing 07/10/2019 1746 by Junita Push, RN Outcome: Progressing   Problem: Respiratory: Goal: Will maintain a patent airway 07/10/2019 1746 by Junita Push, RN Outcome: Progressing 07/10/2019 1746 by Junita Push, RN Outcome: Progressing Goal: Complications related to the disease process, condition or treatment will be avoided or minimized 07/10/2019 1746 by Junita Push, RN Outcome: Progressing 07/10/2019 1746 by Junita Push, RN Outcome: Progressing   Problem: Education: Goal: Ability to demonstrate management of disease process will improve 07/10/2019 1746 by Junita Push, RN Outcome: Progressing 07/10/2019 1746 by Junita Push, RN Outcome: Progressing Goal: Ability to verbalize understanding of medication therapies will improve 07/10/2019 1746 by Junita Push, RN Outcome: Progressing 07/10/2019 1746 by Junita Push, RN Outcome: Progressing Goal: Individualized Educational Video(s) 07/10/2019 1746 by Junita Push, RN Outcome: Progressing 07/10/2019 1746 by Junita Push, RN Outcome: Progressing   Problem: Activity: Goal: Capacity to carry out activities will improve 07/10/2019 1746 by Junita Push, RN Outcome: Progressing 07/10/2019 1746 by Junita Push, RN Outcome: Progressing   Problem: Cardiac: Goal: Ability to achieve and maintain adequate cardiopulmonary perfusion will improve 07/10/2019 1746 by Junita Push, RN Outcome: Progressing 07/10/2019 1746 by Junita Push, RN Outcome: Progressing   Problem: Education: Goal: Knowledge of disease or condition will improve 07/10/2019  1746 by Junita Push, RN Outcome: Progressing 07/10/2019 1746 by Junita Push, RN Outcome: Progressing Goal: Knowledge of the prescribed therapeutic regimen will improve 07/10/2019 1746 by Junita Push, RN Outcome: Progressing 07/10/2019 1746 by Junita Push, RN Outcome: Progressing Goal: Individualized Educational Video(s) 07/10/2019 1746 by Junita Push, RN Outcome: Progressing 07/10/2019 1746 by Junita Push, RN Outcome: Progressing   Problem: Activity: Goal: Ability to tolerate increased activity will improve 07/10/2019 1746 by Junita Push, RN Outcome: Progressing 07/10/2019 1746 by Junita Push, RN Outcome: Progressing Goal: Will verbalize the importance of balancing activity with adequate rest periods 07/10/2019 1746 by Junita Push, RN Outcome: Progressing 07/10/2019 1746 by Junita Push, RN Outcome: Progressing   Problem: Respiratory: Goal: Ability to maintain a clear airway will improve 07/10/2019 1746 by Junita Push, RN Outcome: Progressing 07/10/2019 1746 by Junita Push, RN Outcome: Progressing Goal: Levels of oxygenation will improve 07/10/2019 1746 by Junita Push, RN Outcome: Progressing 07/10/2019 1746 by Junita Push, RN Outcome: Progressing Goal: Ability to maintain adequate ventilation will improve 07/10/2019 1746 by Junita Push, RN Outcome: Progressing 07/10/2019 1746 by Junita Push, RN Outcome: Progressing

## 2019-07-11 DIAGNOSIS — J069 Acute upper respiratory infection, unspecified: Secondary | ICD-10-CM

## 2019-07-11 DIAGNOSIS — U071 COVID-19: Principal | ICD-10-CM

## 2019-07-11 LAB — COMPREHENSIVE METABOLIC PANEL
ALT: 16 U/L (ref 0–44)
AST: 17 U/L (ref 15–41)
Albumin: 3.5 g/dL (ref 3.5–5.0)
Alkaline Phosphatase: 58 U/L (ref 38–126)
Anion gap: 10 (ref 5–15)
BUN: 20 mg/dL (ref 6–20)
CO2: 24 mmol/L (ref 22–32)
Calcium: 8.9 mg/dL (ref 8.9–10.3)
Chloride: 107 mmol/L (ref 98–111)
Creatinine, Ser: 0.78 mg/dL (ref 0.44–1.00)
GFR calc Af Amer: >60 mL/min (ref >60)
GFR calc non Af Amer: >60 mL/min (ref >60)
Glucose, Bld: 115 mg/dL — ABNORMAL HIGH (ref 70–99)
Potassium: 3.3 mmol/L — ABNORMAL LOW (ref 3.5–5.1)
Sodium: 141 mmol/L (ref 135–145)
Total Bilirubin: 0.4 mg/dL (ref 0.3–1.2)
Total Protein: 7 g/dL (ref 6.5–8.1)

## 2019-07-11 LAB — CBC WITH DIFFERENTIAL/PLATELET
Abs Immature Granulocytes: 0.02 10*3/uL (ref 0.00–0.07)
Basophils Absolute: 0 10*3/uL (ref 0.0–0.1)
Basophils Relative: 0 %
Eosinophils Absolute: 0 10*3/uL (ref 0.0–0.5)
Eosinophils Relative: 0 %
HCT: 42.5 % (ref 36.0–46.0)
Hemoglobin: 13.7 g/dL (ref 12.0–15.0)
Immature Granulocytes: 0 %
Lymphocytes Relative: 18 %
Lymphs Abs: 1.2 10*3/uL (ref 0.7–4.0)
MCH: 30.2 pg (ref 26.0–34.0)
MCHC: 32.2 g/dL (ref 30.0–36.0)
MCV: 93.6 fL (ref 80.0–100.0)
Monocytes Absolute: 0.6 10*3/uL (ref 0.1–1.0)
Monocytes Relative: 9 %
Neutro Abs: 4.7 10*3/uL (ref 1.7–7.7)
Neutrophils Relative %: 73 %
Platelets: 227 10*3/uL (ref 150–400)
RBC: 4.54 MIL/uL (ref 3.87–5.11)
RDW: 13.2 % (ref 11.5–15.5)
WBC: 6.5 10*3/uL (ref 4.0–10.5)
nRBC: 0 % (ref 0.0–0.2)

## 2019-07-11 LAB — D-DIMER, QUANTITATIVE: D-Dimer, Quant: 0.34 ug/mL-FEU (ref 0.00–0.50)

## 2019-07-11 LAB — MAGNESIUM: Magnesium: 1.8 mg/dL (ref 1.7–2.4)

## 2019-07-11 LAB — C-REACTIVE PROTEIN: CRP: 0.7 mg/dL (ref <1.0)

## 2019-07-11 LAB — FERRITIN: Ferritin: 231 ng/mL (ref 11–307)

## 2019-07-11 LAB — PHOSPHORUS: Phosphorus: 3.7 mg/dL (ref 2.5–4.6)

## 2019-07-11 MED ORDER — POTASSIUM CHLORIDE CRYS ER 20 MEQ PO TBCR
40.0000 meq | EXTENDED_RELEASE_TABLET | Freq: Once | ORAL | Status: AC
  Administered 2019-07-11: 40 meq via ORAL
  Filled 2019-07-11: qty 2

## 2019-07-11 MED ORDER — ENSURE ENLIVE PO LIQD
237.0000 mL | Freq: Two times a day (BID) | ORAL | Status: DC
  Administered 2019-07-12 – 2019-07-13 (×3): 237 mL via ORAL

## 2019-07-11 NOTE — Discharge Planning (Signed)
SATURATION QUALIFICATIONS: (This note is used to comply with regulatory documentation for home oxygen)  Patient Saturations on Room Air at Rest = 90%  Patient Saturations on Room Air while Ambulating = 86%  Patient Saturations on 2 Liters of oxygen while Ambulating = 92%  Please briefly explain why patient needs home oxygen: Patient is unable to maintain sats at 90% or greater w/ activity.  If discharged - will need o2 at home for activity.

## 2019-07-11 NOTE — Progress Notes (Signed)
PROGRESS NOTE                                                                                                                                                                                                             Patient Demographics:    Brenda [name], is a 59 y.o. female, DOB - 1961-01-11, [id number]  Admit date - 07/09/2019   Admitting Physician Shela Leff, MD  Outpatient Primary MD for the patient is [facility]  LOS - 2   Chief Complaint  Patient presents with   Shortness of Breath       Brief Narrative     59 y.o. female with medical history significant of RAS; PVD; HLD; HTN; CHF; carotid occlusion; and CAD presenting with SOB.  She reports that she developed symptoms last weekend including fatigue and loss of taste/smell.  She went for testing on Monday and was notified Tuesday that she was positive for COVID-19 infection. Her daughter was tested that day and was also positive.  Meanwhile, she has had intermittent fevers and progressive SOB.  She has developed cough today, but it is mild.    Subjective:    Brenda [name] today reports is improving, denies any chest pain, still reports some cough some dyspnea, cough, [place] any chest pain.   Assessment  & Plan :    Principal Problem:   Acute respiratory failure with hypoxia (HCC) Active Problems:   HYPERCHOLESTEROLEMIA   Essential hypertension   Coronary atherosclerosis   COPD (chronic obstructive pulmonary disease) (HCC)   Hyperglycemia   Chronic diastolic CHF (congestive heart failure) (HCC)   Acute respiratory disease due to COVID-19 virus   Obesity (BMI 30.0-34.9)  Acute respiratory failure with hypoxia due to COVID-19 infection -Patient with presenting with SOB and reported fever at home; also with recent cough.  Reports that she was desaturated to 88% on room air in ED, initially requiring 2 L nasal cannula, she remains on room air  morning, but dropping to 86% with activity where she is requiring oxygen -Continue with IV steroids. -Continue with IV remdesivir. -Continue to trend inflammatory markers, reassuringly they are on the lower side -I see no indication for Actemra -Patient high risk for worsening respiratory failure due to COVID-19 with known history of hypertension, diabetes and PAD   COVID-19 Labs  Recent  Labs    07/09/19 0840 07/10/19 0050 07/11/19 0254  DDIMER 0.54* 0.46 0.34  FERRITIN 210 248 231  LDH 173  --   --   CRP 1.8* 1.4* 0.7    Lab Results  Component Value Date   SARSCOV2NAA POSITIVE (A) 07/09/2019   SARSCOV2NAA NOT DETECTED 05/07/2019   [place] NOT DETECTED 02/25/2019     HTN -Continue ARB (formulary substitution)  Chronic diastolic CHF -[id number] echo with preserved EF and grade 1 diastolic dysfunction -Appears to be compensated at this time  ASCVD -s/p renal artery stenting in 2008 with perforation treated by coiling -Subsequent in-stent restenosis that was treated with stenting -also s/p CEA and R carotid to subclavian artery bypass -Also with B common iliac artery stents -Followed by Dr. Fletcher Anon -Continue ASA, Brilinta  COPD -Remote tobacco history -Continue Spiriva -No apparent exacerbation on admission  HLD -Continue Zocor  Obesity -BMI 31 -Weight loss should be encourage  Hyperglycemia -May be stress response -A1c is 5.9, this is most likely related to Covid and steroids, blood glucose uncontrolled and morning labs.   Code Status : Full code  Family Communication  : Discussed with the patient  Disposition Plan  : Home once stable  Barriers For Discharge : Patient remains on IV steroids, IV remdesivir, plan to discharge home on Wednesday after finishing her IV remdesivir  Consults  :  None  Procedures  : none  DVT Prophylaxis  :  [place] lovenox  Lab Results  Component Value Date   PLT 227 07/11/2019    Antibiotics  :     Anti-infectives (From admission, onward)   Start     Dose/Rate Route Frequency Ordered Stop   07/10/19 1000  remdesivir 100 mg in sodium chloride 0.9 % 100 mL IVPB     100 mg 200 mL/hr over 30 Minutes Intravenous Daily 07/09/19 1138 07/14/19 0959   07/09/19 1145  remdesivir 200 mg in sodium chloride 0.9% 250 mL IVPB     200 mg 580 mL/hr over 30 Minutes Intravenous Once 07/09/19 1138 07/09/19 1241        Objective:   Vitals:   07/11/19 0539 07/11/19 0651 07/11/19 0800 07/11/19 0810  BP:   (!) 154/55   Pulse: (!) 48 (!) 58    Resp: 19 12    Temp:   98.2 F (36.8 C)   TempSrc:   Oral   SpO2: 92% 96%  (!) 86%  Weight:      Height:        Wt Readings from Last 3 Encounters:  07/09/19 81.8 kg  06/14/19 83 kg  05/11/19 83.5 kg     Intake/Output Summary (Last 24 hours) at 07/11/2019 1434 Last data filed at 07/10/2019 2000 Gross per 24 hour  Intake 120 ml  Output --  Net 120 ml     Physical Exam  Awake Alert, Oriented X 3, No new F.N deficits, Normal affect Symmetrical Chest wall movement, Good air movement bilaterally, CTAB RRR,No Gallops,Rubs or new Murmurs, No Parasternal Heave +ve B.Sounds, Abd Soft, No tenderness, No rebound - guarding or rigidity. No Cyanosis, Clubbing or edema, No new Rash or bruise       Data Review:    CBC Recent Labs  Lab 07/09/19 0355 07/09/19 0400 07/10/19 0050 07/11/19 0254  WBC 5.5  --  4.1 6.5  HGB 14.6 14.6 13.9 13.7  HCT 44.2 43.0 42.4 42.5  PLT 191  --  210 227  MCV 92.7  --  92.4  93.6  MCH 30.6  --  30.3 30.2  MCHC 33.0  --  32.8 32.2  RDW 12.9  --  13.1 13.2  LYMPHSABS 0.9  --  1.1 1.2  MONOABS 0.6  --  0.7 0.6  EOSABS 0.0  --  0.0 0.0  BASOSABS 0.0  --  0.0 0.0    Chemistries  Recent Labs  Lab 07/09/19 0400 07/09/19 0840 07/10/19 0050 07/11/19 0254  NA 138 135 139 141  K 3.4* 3.7 3.5 3.3*  CL 105 102 107 107  CO2  --  21* 23 24  GLUCOSE 91 105* 117* 115*  BUN 7 9 15 20   CREATININE 0.70 0.96 0.88  0.78  CALCIUM  --  8.7* 9.0 8.9  MG  --   --  1.9 1.8  AST  --  28 23 17   ALT  --  20 17 16   ALKPHOS  --  73 68 58  BILITOT  --  0.4 0.1* 0.4   ------------------------------------------------------------------------------------------------------------------ No results for input(s): CHOL, HDL, LDLCALC, TRIG, CHOLHDL, LDLDIRECT in the last 72 hours.  Lab Results  Component Value Date   HGBA1C 5.6 07/09/2019   ------------------------------------------------------------------------------------------------------------------ No results for input(s): TSH, T4TOTAL, T3FREE, THYROIDAB in the last 72 hours.  Invalid input(s): FREET3 ------------------------------------------------------------------------------------------------------------------ Recent Labs    07/10/19 0050 07/11/19 0254  FERRITIN 248 231    Coagulation profile No results for input(s): INR, PROTIME in the last 168 hours.  Recent Labs    07/10/19 0050 07/11/19 0254  DDIMER 0.46 0.34    Cardiac Enzymes No results for input(s): CKMB, TROPONINI, MYOGLOBIN in the last 168 hours.  Invalid input(s): CK ------------------------------------------------------------------------------------------------------------------    Component Value Date/Time   BNP 79.6 10/31/2014 0023    Inpatient Medications  Scheduled Meds:  aspirin EC  81 mg Oral Daily   carvedilol  6.25 mg Oral BID   dexamethasone (DECADRON) injection  6 mg Intravenous Q24H   enoxaparin (LOVENOX) injection  40 mg Subcutaneous Q24H   fluticasone  1 spray Each Nare QHS   irbesartan  150 mg Oral Daily   pantoprazole  40 mg Oral Daily   phosphorus  500 mg Oral BID   simvastatin  40 mg Oral QHS   sodium chloride flush  3 mL Intravenous Q12H   ticagrelor  60 mg Oral BID   umeclidinium bromide  1 puff Inhalation Daily   Continuous Infusions:  sodium chloride     remdesivir 100 mg in NS 100 mL 100 mg (07/11/19 0833)   PRN Meds:.sodium  chloride, acetaminophen, albuterol, bisacodyl, chlorpheniramine-HYDROcodone, guaiFENesin-dextromethorphan, influenza vac split quadrivalent PF, ondansetron **OR** ondansetron (ZOFRAN) IV, polyethylene glycol, sodium chloride flush, sodium phosphate, traMADol  Micro Results Recent Results (from the past 240 hour(s))  Respiratory Panel by RT PCR (Flu A&B, Covid) - Nasopharyngeal Swab     Status: Abnormal   Collection Time: 07/09/19  5:15 AM   Specimen: Nasopharyngeal Swab  Result Value Ref Range Status   SARS Coronavirus 2 by RT PCR POSITIVE (A) NEGATIVE Final    Comment: RESULT CALLED TO, READ BACK BY AND VERIFIED WITH: P PEICKERT ON [id number] AT 0700 BY NFIELDS (NOTE) SARS-CoV-2 target nucleic acids are DETECTED. SARS-CoV-2 RNA is generally detectable in upper respiratory specimens  during the acute phase of infection. Positive results are indicative of the presence of the identified virus, but do not rule out bacterial infection or co-infection with other pathogens not detected by the test. Clinical correlation with patient history and other diagnostic information  is necessary to determine patient infection status. The expected result is Negative. Fact Sheet for Patients:  PinkCheek.be Fact Sheet for Healthcare Providers: GravelBags.it This test is not yet approved or cleared by the [place] FDA and  has been authorized for detection and/or diagnosis of SARS-CoV-2 by FDA under an Emergency Use Authorization (EUA).  This EUA will remain in effect (meaning this test can be use d) for the duration of  the COVID-19 declaration under Section 564(b)(1) of the Act, 21 U.S.C. section 360bbb-3(b)(1), unless the authorization is terminated or revoked sooner.    Influenza A by PCR NEGATIVE NEGATIVE Final   Influenza B by PCR NEGATIVE NEGATIVE Final    Comment: (NOTE) The Xpert Xpress SARS-CoV-2/FLU/RSV assay is intended as an aid in    the diagnosis of influenza from Nasopharyngeal swab specimens and  should not be used as a sole basis for treatment. Nasal washings and  aspirates are unacceptable for Xpert Xpress SARS-CoV-2/FLU/RSV  testing. Fact Sheet for Patients: PinkCheek.be Fact Sheet for Healthcare Providers: GravelBags.it This test is not yet approved or cleared by the [place] FDA and  has been authorized for detection and/or diagnosis of SARS-CoV-2 by  FDA under an Emergency Use Authorization (EUA). This EUA will remain  in effect (meaning this test can be used) for the duration of the  Covid-19 declaration under Section 564(b)(1) of the Act, 21  U.S.C. section 360bbb-3(b)(1), unless the authorization is  terminated or revoked. Performed at [facility], [place] [address] [place], [place] [address]     Radiology Reports DG Chest Portable 1 View  Result Date: 07/09/2019 CLINICAL DATA:  Shortness of breath. COVID positive. EXAM: PORTABLE CHEST 1 VIEW COMPARISON:  09/07/2015 FINDINGS: Heart is normal in size. Aortic atherosclerosis. Apical predominant emphysema. Minimal streaky opacity at the left lung base. Possible small pleural effusions. No pneumothorax. No acute osseous abnormalities. IMPRESSION: 1. Minimal streaky left lung base opacity, may be atelectasis or pneumonia involving of COVID-19. 2. Possible small pleural effusions. 3. Emphysema. Aortic Atherosclerosis (ICD10-I70.0) and Emphysema (ICD10-J43.9). Electronically Signed   By: Keith Rake M.D.   On: 07/09/2019 04:01     Phillips Climes M.D on 07/11/2019 at 2:34 PM  Between 7am to 7pm - Pager - [phone]  After 7pm go to [email or web address] - password [facility]  [facility] -  Office  [phone]

## 2019-07-11 NOTE — Plan of Care (Signed)
Attempted to call sister to update, but no answer and voicemail was FULL (not able to accept new messages). Patient informed and stated she would update as needed.

## 2019-07-11 NOTE — Progress Notes (Signed)
Initial Nutrition Assessment  DOCUMENTATION CODES:   Not applicable  INTERVENTION:   Ensure Enlive po BID, each supplement provides 350 kcal and 20 grams of protein  Pt receiving Hormel Shake daily with Breakfast which provides 520 kcals and 22 g of protein and Magic cup BID with lunch and dinner, each supplement provides 290 kcal and 9 grams of protein, automatically on meal trays to optimize nutritional intake.   NUTRITION DIAGNOSIS:   Increased nutrient needs related to acute illness, catabolic [id number] as evidenced by estimated needs.  GOAL:   Patient will meet greater than or equal to 90% of their needs  MONITOR:   PO intake, Supplement acceptance  REASON FOR ASSESSMENT:   Malnutrition Screening Tool    ASSESSMENT:   59 yo female admitted with SOB. Tested positive for COVID-19 on the Tuesday PTA (1/26). PMH includes HLD, HTN, COPD, hyperglycemia, CHF, obesity.   Currently on a heart healthy diet, consuming 25-50% of meals.   Requiring 1-2 L oxygen via nasal cannula.  Labs reviewed. Potassium 3.3 (L)  Medications reviewed and include decadron, K Phos, remdesivir.  PTA, patient was eating poorly due to loss of taste and smell. Weight is down by 1.5% since 06/14/19, which is not significant for the time frame. Patient would benefit from PO supplements to ensure adequate intake to meet increased nutrition needs for COVID-19.  NUTRITION - FOCUSED PHYSICAL EXAM:  unable to complete due to COVID restrictions  Diet Order:   Diet Order            Diet Heart Room service appropriate? Yes; Fluid consistency: Thin  Diet effective now              EDUCATION NEEDS:   No education needs have been identified at this time  Skin:  Skin Assessment: Reviewed RN Assessment  Last BM:  1/31  Height:   Ht Readings from Last 1 Encounters:  07/09/19 5\' 4"  (1.626 m)    Weight:   Wt Readings from Last 1 Encounters:  07/09/19 81.8 kg    Ideal Body Weight:   54.5 kg  BMI:  Body mass index is 30.95 kg/m.  Estimated Nutritional Needs:   Kcal:  1900-2200  Protein:  85-105 gm  Fluid:  >/= 1.8 L    Molli Barrows, RD, LDN, [place] Pager [phone] After Hours Pager [phone]

## 2019-07-12 LAB — COMPREHENSIVE METABOLIC PANEL
ALT: 16 U/L (ref 0–44)
AST: 20 U/L (ref 15–41)
Albumin: 3.5 g/dL (ref 3.5–5.0)
Alkaline Phosphatase: 58 U/L (ref 38–126)
Anion gap: 10 (ref 5–15)
BUN: 16 mg/dL (ref 6–20)
CO2: 24 mmol/L (ref 22–32)
Calcium: 8.9 mg/dL (ref 8.9–10.3)
Chloride: 107 mmol/L (ref 98–111)
Creatinine, Ser: 0.62 mg/dL (ref 0.44–1.00)
GFR calc Af Amer: >60 mL/min (ref >60)
GFR calc non Af Amer: >60 mL/min (ref >60)
Glucose, Bld: 114 mg/dL — ABNORMAL HIGH (ref 70–99)
Potassium: 3.4 mmol/L — ABNORMAL LOW (ref 3.5–5.1)
Sodium: 141 mmol/L (ref 135–145)
Total Bilirubin: 0.4 mg/dL (ref 0.3–1.2)
Total Protein: 7 g/dL (ref 6.5–8.1)

## 2019-07-12 LAB — CBC WITH DIFFERENTIAL/PLATELET
Abs Immature Granulocytes: 0.06 10*3/uL (ref 0.00–0.07)
Basophils Absolute: 0 10*3/uL (ref 0.0–0.1)
Basophils Relative: 0 %
Eosinophils Absolute: 0 10*3/uL (ref 0.0–0.5)
Eosinophils Relative: 0 %
HCT: 42.2 % (ref 36.0–46.0)
Hemoglobin: 13.8 g/dL (ref 12.0–15.0)
Immature Granulocytes: 1 %
Lymphocytes Relative: 15 %
Lymphs Abs: 1.1 10*3/uL (ref 0.7–4.0)
MCH: 30.1 pg (ref 26.0–34.0)
MCHC: 32.7 g/dL (ref 30.0–36.0)
MCV: 91.9 fL (ref 80.0–100.0)
Monocytes Absolute: 0.5 10*3/uL (ref 0.1–1.0)
Monocytes Relative: 6 %
Neutro Abs: 5.6 10*3/uL (ref 1.7–7.7)
Neutrophils Relative %: 78 %
Platelets: 281 10*3/uL (ref 150–400)
RBC: 4.59 MIL/uL (ref 3.87–5.11)
RDW: 13 % (ref 11.5–15.5)
WBC: 7.2 10*3/uL (ref 4.0–10.5)
nRBC: 0 % (ref 0.0–0.2)

## 2019-07-12 LAB — FERRITIN: Ferritin: 231 ng/mL (ref 11–307)

## 2019-07-12 LAB — PHOSPHORUS: Phosphorus: 3.8 mg/dL (ref 2.5–4.6)

## 2019-07-12 LAB — D-DIMER, QUANTITATIVE: D-Dimer, Quant: 0.35 ug/mL-FEU (ref 0.00–0.50)

## 2019-07-12 LAB — MAGNESIUM: Magnesium: 1.9 mg/dL (ref 1.7–2.4)

## 2019-07-12 LAB — C-REACTIVE PROTEIN: CRP: 0.6 mg/dL (ref <1.0)

## 2019-07-12 MED ORDER — AMLODIPINE BESYLATE 5 MG PO TABS
5.0000 mg | ORAL_TABLET | Freq: Every day | ORAL | Status: DC
  Administered 2019-07-12 – 2019-07-13 (×2): 5 mg via ORAL
  Filled 2019-07-12 (×2): qty 1

## 2019-07-12 MED ORDER — POTASSIUM CHLORIDE CRYS ER 20 MEQ PO TBCR
40.0000 meq | EXTENDED_RELEASE_TABLET | Freq: Once | ORAL | Status: AC
  Administered 2019-07-12: 40 meq via ORAL
  Filled 2019-07-12: qty 2

## 2019-07-12 MED ORDER — ATORVASTATIN CALCIUM 10 MG PO TABS
20.0000 mg | ORAL_TABLET | Freq: Every day | ORAL | Status: DC
  Administered 2019-07-12: 22:00:00 20 mg via ORAL
  Filled 2019-07-12: qty 2

## 2019-07-12 NOTE — Progress Notes (Signed)
SATURATION QUALIFICATIONS: (This note is used to comply with regulatory documentation for home oxygen)  Patient Saturations on Room Air at Rest = 93%  Patient Saturations on Room Air while Ambulating = 85%  Patient Saturations on 2 Liters of oxygen while Ambulating = 93%  Please briefly explain why patient needs home oxygen: Pt requires supplemental oxygen to maintain SpO2 at 88% and above while ambulating.

## 2019-07-12 NOTE — TOC Initial Note (Signed)
Transition of Care [facility]) - Initial/Assessment Note    Patient Details  Name: Brenda [name] MRN: [id number] Date of Birth: Jun 27, 1960  Transition of Care (TOC) CM/SW Contact:    Joaquin Courts, RN Phone Number: 07/12/2019, 2:50 PM  Clinical Narrative:           CM attempted to reach patient by room phone and cell phone but was unsuccessful.  CM spoke with patient's daughter and explained that patient will need home oxygen at discharge.  Daughter reports she will be available to receive O2 delivery in the home and plans to transport her mom home by when she is discharged.  Home oxygen referral given to Ken Caryl who reports agency will deliver concentrator and portable tank to the home and instruct daughter how to use equipment and to bring portable tank with her when she picks up her mom.          Expected Discharge Plan: Home/Self Care Barriers to Discharge: Continued Medical Work up   Patient Goals and CMS Choice Patient states their goals for this hospitalization and ongoing recovery are:: to go home CMS Medicare.gov Compare Post Acute Care list provided to:: Patient Represenative (must comment) Choice offered to / list presented to : Adult Children  Expected Discharge Plan and Services Expected Discharge Plan: Home/Self Care   Discharge Planning Services: CM Consult Post Acute Care Choice: Durable Medical Equipment Living arrangements for the past 2 months: Single Family Home                 DME Arranged: Oxygen DME Agency: [place] Date DME Agency Contacted: 07/12/19 Time DME Agency Contacted: [id number] Representative spoke with at DME Agency: [place]: NA [place] Agency: NA        Prior Living Arrangements/Services Living arrangements for the past 2 months: [place] with:: Adult Children Patient language and need for interpreter reviewed:: Yes Do you feel safe going back to the place where you live?: Yes      Need for Family  Participation in Patient Care: Yes (Comment) Care giver support system in place?: Yes (comment)   Criminal Activity/Legal Involvement Pertinent to Current Situation/Hospitalization: No - Comment as needed  Activities of Daily Living Home Assistive Devices/Equipment: None ADL Screening (condition at time of admission) Patient's cognitive ability adequate to safely complete daily activities?: Yes Is the patient deaf or have difficulty hearing?: Yes Does the patient have difficulty seeing, even when wearing glasses/contacts?: Yes Does the patient have difficulty concentrating, remembering, or making decisions?: No Patient able to express need for assistance with ADLs?: Yes Does the patient have difficulty dressing or bathing?: No Independently performs ADLs?: Yes (appropriate for developmental age) Does the patient have difficulty walking or climbing stairs?: No Weakness of Legs: None Weakness of Arms/Hands: None  Permission Sought/Granted                  Emotional Assessment           Psych Involvement: No (comment)  Admission diagnosis:  Pneumonia [J18.9] Elevated troponin I level [R77.8] Chronic obstructive pulmonary disease, unspecified COPD type ([place]) [J44.9] COVID-19 [U07.1] Patient Active Problem List   Diagnosis Date Noted  . Pneumonia 07/09/2019  . Acute respiratory disease due to COVID-19 virus 07/09/2019  . Obesity (BMI 30.0-34.9) 07/09/2019  . Chronic diastolic CHF (congestive heart failure) ([place]) 11/02/2014  . Acute respiratory failure with hypoxia ([place]) 11/01/2014  . COPD exacerbation ([place]) 10/31/2014  . Hyperglycemia 10/31/2014  .  Polycythemia 10/31/2014  . Strep throat   . Research subject 10/10/2011  . ANEMIA, CHRONIC 11/03/2008  . Essential hypertension 11/03/2008  . Coronary atherosclerosis 11/03/2008  . CAROTID ARTERY OCCLUSION 11/03/2008  . PULMONARY NODULE, SOLITARY 11/03/2008  . HYPERCHOLESTEROLEMIA 10/20/2008  . CARDIOMYOPATHY 10/20/2008   . COPD (chronic obstructive pulmonary disease) ([place]) 10/20/2008  . RENAL ARTERY STENOSIS 10/09/2008  . Atherosclerosis of native arteries of the extremities with intermittent claudication 10/09/2008   PCP:  [facility] [place]:   [place], [address] [place]. [place] [address] Phone: [phone] Fax: [phone]  [id number] [place], [place] [id number] [place] [id number] Phone: [phone] Fax: [phone]     Social Determinants of Health (SDOH) Interventions    Readmission Risk Interventions No flowsheet data found.

## 2019-07-12 NOTE — Plan of Care (Signed)
Went over plan of care with pt. All questions asked and all questions answered.

## 2019-07-12 NOTE — Progress Notes (Signed)
   07/12/19 0707  Family/Significant Other Communication  Family/Significant Other Update Other (Comment)  Pt denies wanting her sister updated.

## 2019-07-12 NOTE — TOC Progression Note (Signed)
Transition of Care [facility]) - Progression Note    Patient Details  Name: Brenda [name] MRN: [id number] Date of Birth: 1960/09/12  [facility]) CM/SW Contact  Joaquin Courts, RN Phone Number: 07/12/2019, 5:07 PM  Clinical Narrative:   CM received confirmation that concentrator and portable tank have been delivered to home.     Expected Discharge Plan: Home/Self Care Barriers to Discharge: Continued Medical Work up  Expected Discharge Plan and Services Expected Discharge Plan: Home/Self Care   Discharge Planning Services: CM Consult Post Acute Care Choice: Durable Medical Equipment Living arrangements for the past 2 months: Single Family Home                 DME Arranged: Oxygen DME Agency: [place] Date DME Agency Contacted: 07/12/19 Time DME Agency Contacted: [id number] Representative spoke with at DME Agency: Jeneen Rinks HH Arranged: NA [place] Agency: NA         Social Determinants of Health ([place]) Interventions    Readmission Risk Interventions No flowsheet data found.

## 2019-07-12 NOTE — Progress Notes (Signed)
Ambulation Note  Saturation Pre: 93% on RA  Ambulation Distance: 460 ft  Saturation During Ambulation: 93-94% on RA  Notes: Pt walked independently. Pt walked about 100 ft on RA before SpO2 dropped. Recovered quickly on 2L. Pt returned to bedside with call bell within reach, SpO2 94% on RA.   Philis Kendall, MS, ACSM CEP 11:35 AM 07/12/2019

## 2019-07-12 NOTE — Progress Notes (Signed)
PROGRESS NOTE                                                                                                                                                                                                             Patient Demographics:    Brenda [name], is a 59 y.o. female, DOB - [birth year], [id number]  Admit date - 07/09/2019   Admitting Physician Shela Leff, MD  Outpatient Primary MD for the patient is [facility] [place]  LOS - 3   Chief Complaint  Patient presents with  . Shortness of Breath       Brief Narrative     59 y.o. female with medical history significant of RAS; PVD; HLD; HTN; CHF; carotid occlusion; and CAD presenting with SOB.  She reports that she developed symptoms last weekend including fatigue and loss of taste/smell.  She went for testing on Monday and was notified Tuesday that she was positive for COVID-19 infection. Her daughter was tested that day and was also positive.  Meanwhile, she has had intermittent fevers and progressive SOB.  She has developed cough today, but it is mild.    Subjective:    Brenda [name] today reports is improving, denies any chest pain, still reports some cough some dyspnea, cough,denies any chest pain.   Assessment  & Plan :    Principal Problem:   Acute respiratory failure with hypoxia (HCC) Active Problems:   HYPERCHOLESTEROLEMIA   Essential hypertension   Coronary atherosclerosis   COPD (chronic obstructive pulmonary disease) (HCC)   Hyperglycemia   Chronic diastolic CHF (congestive heart failure) (HCC)   Acute respiratory disease due to COVID-19 virus   Obesity (BMI 30.0-34.9)  Acute respiratory failure with hypoxia due to COVID-19 infection -Patient with presenting with SOB and reported fever at home; also with recent cough.  Reports that she was desaturated to 88% on room air in ED, initially requiring 2 L nasal cannula, she still requiring 2  L oxygen with activity, will arrange for home oxygen on discharge . -Continue with IV steroids. -Continue with IV remdesivir 4/5. -Continue to trend inflammatory markers, reassuringly they are on the lower side -I see no indication for Actemra -Patient high risk for worsening respiratory failure due to COVID-19 with known history of hypertension, diabetes and PAD   COVID-19 Labs  Recent Labs  07/10/19 0050 07/11/19 0254 07/12/19 0329  DDIMER 0.46 0.34 0.35  FERRITIN 248 231 231  CRP 1.4* 0.7 0.6    Lab Results  Component Value Date   SARSCOV2NAA POSITIVE (A) 07/09/2019   SARSCOV2NAA NOT DETECTED 05/07/2019   SARSCOV2NAA NOT DETECTED 02/25/2019     HTN -Remains poorly controlled on Coreg and Avapro, will add amlodipine .   Chronic diastolic CHF -[id number] echo with preserved EF and grade 1 diastolic dysfunction -Appears to be compensated at this time  ASCVD -s/p renal artery stenting in 2008 with perforation treated by coiling -Subsequent in-stent restenosis that was treated with stenting -also s/p CEA and R carotid to subclavian artery bypass -Also with B common iliac artery stents -Followed by Dr. Fletcher Anon -Continue ASA, Brilinta  COPD -Remote tobacco history -Continue Spiriva -No apparent exacerbation on admission  HLD -Continue Zocor  Obesity -BMI 31 -Weight loss should be encourage  Hyperglycemia -May be stress response -A1c is 5.9, this is most likely related to Covid and steroids, blood glucose uncontrolled and morning labs.   Code Status : Full code  Family Communication  : Discussed with the patient  Disposition Plan  : Home tomorrow after finishing her last remdesivir  Barriers For Discharge : Patient remains on IV steroids, IV remdesivir, plan to discharge home on Wednesday after finishing her IV remdesivir  Consults  :  None  Procedures  : none  DVT Prophylaxis  :  [place] lovenox  Lab Results  Component Value Date   PLT 281  07/12/2019    Antibiotics  :    Anti-infectives (From admission, onward)   Start     Dose/Rate Route Frequency Ordered Stop   07/10/19 1000  remdesivir 100 mg in sodium chloride 0.9 % 100 mL IVPB     100 mg 200 mL/hr over 30 Minutes Intravenous Daily 07/09/19 1138 07/14/19 0959   07/09/19 1145  remdesivir 200 mg in sodium chloride 0.9% 250 mL IVPB     200 mg 580 mL/hr over 30 Minutes Intravenous Once 07/09/19 1138 07/09/19 1241        Objective:   Vitals:   07/12/19 0444 07/12/19 0630 07/12/19 0707 07/12/19 0857  BP: (!) 181/47 (!) 169/51 (!) 175/71 (!) 159/58  Pulse:   79   Resp:   18   Temp:   98.3 F (36.8 C)   TempSrc:   Oral   SpO2:   93%   Weight:      Height:        Wt Readings from Last 3 Encounters:  07/09/19 81.8 kg  06/14/19 83 kg  05/11/19 83.5 kg     Intake/Output Summary (Last 24 hours) at 07/12/2019 1420 Last data filed at 07/12/2019 0900 Gross per 24 hour  Intake 1070 ml  Output --  Net 1070 ml     Physical Exam  Awake Alert, Oriented X 3, No new F.N deficits, Normal affect Symmetrical Chest wall movement, Good air movement bilaterally, CTAB RRR,No Gallops,Rubs or new Murmurs, No Parasternal Heave +ve B.Sounds, Abd Soft, No tenderness, No rebound - guarding or rigidity. No Cyanosis, Clubbing or edema, No new Rash or bruise        Data Review:    CBC Recent Labs  Lab 07/09/19 0355 07/09/19 0400 07/10/19 0050 07/11/19 0254 07/12/19 0329  WBC 5.5  --  4.1 6.5 7.2  HGB 14.6 14.6 13.9 13.7 13.8  HCT 44.2 43.0 42.4 42.5 42.2  PLT 191  --  210 227 281  MCV 92.7  --  92.4 93.6 91.9  MCH 30.6  --  30.3 30.2 30.1  MCHC 33.0  --  32.8 32.2 32.7  RDW 12.9  --  13.1 13.2 13.0  LYMPHSABS 0.9  --  1.1 1.2 1.1  MONOABS 0.6  --  0.7 0.6 0.5  EOSABS 0.0  --  0.0 0.0 0.0  BASOSABS 0.0  --  0.0 0.0 0.0    Chemistries  Recent Labs  Lab 07/09/19 0400 07/09/19 0840 07/10/19 0050 07/11/19 0254 07/12/19 0329  NA 138 135 139 141 141  K  3.4* 3.7 3.5 3.3* 3.4*  CL 105 102 107 107 107  CO2  --  21* 23 24 24   GLUCOSE 91 105* 117* 115* 114*  BUN 7 9 15 20 16   CREATININE 0.70 0.96 0.88 0.78 0.62  CALCIUM  --  8.7* 9.0 8.9 8.9  MG  --   --  1.9 1.8 1.9  AST  --  28 23 17 20   ALT  --  20 17 16 16   ALKPHOS  --  73 68 58 58  BILITOT  --  0.4 0.1* 0.4 0.4   ------------------------------------------------------------------------------------------------------------------ No results for input(s): CHOL, HDL, LDLCALC, TRIG, CHOLHDL, LDLDIRECT in the last 72 hours.  Lab Results  Component Value Date   HGBA1C 5.6 07/09/2019   ------------------------------------------------------------------------------------------------------------------ No results for input(s): TSH, T4TOTAL, T3FREE, THYROIDAB in the last 72 hours.  Invalid input(s): FREET3 ------------------------------------------------------------------------------------------------------------------ Recent Labs    07/11/19 0254 07/12/19 0329  FERRITIN 231 231    Coagulation profile No results for input(s): INR, PROTIME in the last 168 hours.  Recent Labs    07/11/19 0254 07/12/19 0329  DDIMER 0.34 0.35    Cardiac Enzymes No results for input(s): CKMB, TROPONINI, MYOGLOBIN in the last 168 hours.  Invalid input(s): CK ------------------------------------------------------------------------------------------------------------------    Component Value Date/Time   BNP 79.6 10/31/2014 0023    Inpatient Medications  Scheduled Meds: . amLODipine  5 mg Oral Daily  . aspirin EC  81 mg Oral Daily  . atorvastatin  20 mg Oral QHS  . carvedilol  6.25 mg Oral BID  . dexamethasone (DECADRON) injection  6 mg Intravenous Q24H  . enoxaparin (LOVENOX) injection  40 mg Subcutaneous Q24H  . feeding supplement (ENSURE ENLIVE)  237 mL Oral BID BM  . fluticasone  1 spray Each Nare QHS  . irbesartan  150 mg Oral Daily  . pantoprazole  40 mg Oral Daily  . sodium chloride  flush  3 mL Intravenous Q12H  . ticagrelor  60 mg Oral BID  . umeclidinium bromide  1 puff Inhalation Daily   Continuous Infusions: . sodium chloride    . remdesivir 100 mg in NS 100 mL 100 mg (07/12/19 0904)   PRN Meds:.sodium chloride, acetaminophen, albuterol, bisacodyl, chlorpheniramine-HYDROcodone, guaiFENesin-dextromethorphan, influenza vac split quadrivalent PF, ondansetron **OR** ondansetron (ZOFRAN) IV, polyethylene glycol, sodium chloride flush, sodium phosphate, traMADol  Micro Results Recent Results (from the past 240 hour(s))  Respiratory Panel by RT PCR (Flu A&B, Covid) - Nasopharyngeal Swab     Status: Abnormal   Collection Time: 07/09/19  5:15 AM   Specimen: Nasopharyngeal Swab  Result Value Ref Range Status   SARS Coronavirus 2 by RT PCR POSITIVE (A) NEGATIVE Final    Comment: RESULT CALLED TO, READ BACK BY AND VERIFIED WITH: P PEICKERT ON [id number] AT 0700 BY NFIELDS (NOTE) SARS-CoV-2 target nucleic acids are DETECTED. SARS-CoV-2 RNA is generally detectable in upper respiratory specimens  during the  acute phase of infection. Positive results are indicative of the presence of the identified virus, but do not rule out bacterial infection or co-infection with other pathogens not detected by the test. Clinical correlation with patient history and other diagnostic information is necessary to determine patient infection status. The expected result is Negative. Fact Sheet for Patients:  PinkCheek.be Fact Sheet for Healthcare Providers: GravelBags.it This test is not yet approved or cleared by the [place] FDA and  has been authorized for detection and/or diagnosis of SARS-CoV-2 by FDA under an Emergency Use Authorization (EUA).  This EUA will remain in effect (meaning this test can be use d) for the duration of  the COVID-19 declaration under Section 564(b)(1) of the Act, 21 U.S.C. section 360bbb-3(b)(1),  unless the authorization is terminated or revoked sooner.    Influenza A by PCR NEGATIVE NEGATIVE Final   Influenza B by PCR NEGATIVE NEGATIVE Final    Comment: (NOTE) The Xpert Xpress SARS-CoV-2/FLU/RSV assay is intended as an aid in  the diagnosis of influenza from Nasopharyngeal swab specimens and  should not be used as a sole basis for treatment. Nasal washings and  aspirates are unacceptable for Xpert Xpress SARS-CoV-2/FLU/RSV  testing. Fact Sheet for Patients: PinkCheek.be Fact Sheet for Healthcare Providers: GravelBags.it This test is not yet approved or cleared by the [place] FDA and  has been authorized for detection and/or diagnosis of SARS-CoV-2 by  FDA under an Emergency Use Authorization (EUA). This EUA will remain  in effect (meaning this test can be used) for the duration of the  Covid-19 declaration under Section 564(b)(1) of the Act, 21  U.S.C. section 360bbb-3(b)(1), unless the authorization is  terminated or revoked. Performed at [facility], [place] [address] [place], [place] [address]     Radiology Reports DG Chest Portable 1 View  Result Date: 07/09/2019 CLINICAL DATA:  Shortness of breath. COVID positive. EXAM: PORTABLE CHEST 1 VIEW COMPARISON:  09/07/2015 FINDINGS: Heart is normal in size. Aortic atherosclerosis. Apical predominant emphysema. Minimal streaky opacity at the left lung base. Possible small pleural effusions. No pneumothorax. No acute osseous abnormalities. IMPRESSION: 1. Minimal streaky left lung base opacity, may be atelectasis or pneumonia involving of COVID-19. 2. Possible small pleural effusions. 3. Emphysema. Aortic Atherosclerosis (ICD10-I70.0) and Emphysema (ICD10-J43.9). Electronically Signed   By: Keith Rake M.D.   On: 07/09/2019 04:01     Phillips Climes M.D on 07/12/2019 at 2:20 PM  Between 7am to 7pm - Pager - [phone]  After 7pm go to [email or web address] -  password [facility]  [facility] -  Office  [phone]

## 2019-07-13 DIAGNOSIS — J449 Chronic obstructive pulmonary disease, unspecified: Secondary | ICD-10-CM

## 2019-07-13 LAB — COMPREHENSIVE METABOLIC PANEL
ALT: 17 U/L (ref 0–44)
AST: 18 U/L (ref 15–41)
Albumin: 3.7 g/dL (ref 3.5–5.0)
Alkaline Phosphatase: 58 U/L (ref 38–126)
Anion gap: 11 (ref 5–15)
BUN: 16 mg/dL (ref 6–20)
CO2: 25 mmol/L (ref 22–32)
Calcium: 9 mg/dL (ref 8.9–10.3)
Chloride: 104 mmol/L (ref 98–111)
Creatinine, Ser: 0.64 mg/dL (ref 0.44–1.00)
GFR calc Af Amer: >60 mL/min (ref >60)
GFR calc non Af Amer: >60 mL/min (ref >60)
Glucose, Bld: 100 mg/dL — ABNORMAL HIGH (ref 70–99)
Potassium: 3.5 mmol/L (ref 3.5–5.1)
Sodium: 140 mmol/L (ref 135–145)
Total Bilirubin: 0.7 mg/dL (ref 0.3–1.2)
Total Protein: 6.9 g/dL (ref 6.5–8.1)

## 2019-07-13 LAB — CBC WITH DIFFERENTIAL/PLATELET
Abs Immature Granulocytes: 0.14 10*3/uL — ABNORMAL HIGH (ref 0.00–0.07)
Basophils Absolute: 0 10*3/uL (ref 0.0–0.1)
Basophils Relative: 0 %
Eosinophils Absolute: 0 10*3/uL (ref 0.0–0.5)
Eosinophils Relative: 0 %
HCT: 42.4 % (ref 36.0–46.0)
Hemoglobin: 14.2 g/dL (ref 12.0–15.0)
Immature Granulocytes: 2 %
Lymphocytes Relative: 19 %
Lymphs Abs: 1.7 10*3/uL (ref 0.7–4.0)
MCH: 30.2 pg (ref 26.0–34.0)
MCHC: 33.5 g/dL (ref 30.0–36.0)
MCV: 90.2 fL (ref 80.0–100.0)
Monocytes Absolute: 0.8 10*3/uL (ref 0.1–1.0)
Monocytes Relative: 9 %
Neutro Abs: 6.4 10*3/uL (ref 1.7–7.7)
Neutrophils Relative %: 70 %
Platelets: 285 10*3/uL (ref 150–400)
RBC: 4.7 MIL/uL (ref 3.87–5.11)
RDW: 12.7 % (ref 11.5–15.5)
WBC: 9.1 10*3/uL (ref 4.0–10.5)
nRBC: 0 % (ref 0.0–0.2)

## 2019-07-13 LAB — PHOSPHORUS: Phosphorus: 2.8 mg/dL (ref 2.5–4.6)

## 2019-07-13 LAB — MAGNESIUM: Magnesium: 2 mg/dL (ref 1.7–2.4)

## 2019-07-13 LAB — FERRITIN: Ferritin: 217 ng/mL (ref 11–307)

## 2019-07-13 LAB — D-DIMER, QUANTITATIVE: D-Dimer, Quant: 0.32 ug/mL-FEU (ref 0.00–0.50)

## 2019-07-13 LAB — C-REACTIVE PROTEIN: CRP: 0.5 mg/dL (ref <1.0)

## 2019-07-13 MED ORDER — AMLODIPINE BESYLATE 10 MG PO TABS: 10.0000 mg | ORAL_TABLET | Freq: Every day | ORAL | 1 refills | Status: DC

## 2019-07-13 MED ORDER — DEXAMETHASONE 6 MG PO TABS: 6.0000 mg | ORAL_TABLET | Freq: Every day | ORAL | 0 refills | Status: DC

## 2019-07-13 MED ORDER — GUAIFENESIN-DM 100-10 MG/5ML PO SYRP: 10.0000 mL | ORAL_SOLUTION | ORAL | 0 refills | Status: AC | PRN

## 2019-07-13 MED ORDER — HYDRALAZINE HCL 25 MG PO TABS
25.0000 mg | ORAL_TABLET | Freq: Three times a day (TID) | ORAL | Status: DC
  Administered 2019-07-13: 25 mg via ORAL
  Filled 2019-07-13: qty 1

## 2019-07-13 NOTE — Progress Notes (Signed)
Patient d/c paper work reviewed with and IVs removed by Urban Gibson, Therapist, sports. All home meds, and belongings were returned to the patient. Daughter is to bring portable O2 when she comes to pick the patient up. Will cont to monitor.

## 2019-07-13 NOTE — Progress Notes (Signed)
Assisted to the exit by staff member, family provided 02 tank and transportation

## 2019-07-13 NOTE — Discharge Summary (Signed)
Physician Discharge Summary  Brenda [name] [id number] DOB: May 17, 1961 DOA: 07/09/2019  [facility] date: 07/09/2019 Discharge date: 07/13/2019  Admitted From: home Disposition:  Home   Recommendations for Outpatient Follow-up:  1. Follow up with PCP in 1-2 weeks  Home Health: none Equipment/Devices: Oxygen, 2L [place] with activities   Discharge Condition: stable CODE STATUS: Full code Diet recommendation: regular  HPI: Per admitting MD, Brenda [name] is a 59 y.o. female with medical history significant of RAS; PVD; HLD; HTN; CHF; carotid occlusion; and CAD presenting with SOB.  She reports that she developed symptoms last weekend including fatigue and loss of taste/smell.  She went for testing on Monday and was notified Tuesday that she was positive for COVID-19 infection. Her daughter was tested that day and was also positive.  Meanwhile, she has had intermittent fevers and progressive SOB.  She has developed cough today, but it is mild.  Hospital Course / Discharge diagnoses:  Acute respiratory failure with hypoxiadue to COVID-19 infection -Patient with presenting with SOB and reported fever at home; also withrecentcough.  Reports that she was desaturated to 88% on room air in ED, initially requiring 2 L nasal cannula, she still requiring 2 L oxygen with activity, will arrange for home oxygen on discharge. She finished a course of remdesivir while hospitalized and needs 5 additional days of steroids with decadron HTN -Remains poorly controlled on Coreg and Avapro, added Amlodipine Chronic diastolic CHF -[id number] echo with preserved EF and grade 1 diastolic dysfunction, appears to be compensated at this time ASCVD -s/p renal artery stenting in 2008 with perforation treated by coiling, subsequent in-stent restenosis that was treated with stenting, also s/p CEA and R carotid to subclavian artery bypass, also with B common iliac artery stents.  Followed by Dr. Fletcher Anon, Continue ASA, Brilinta COPD -Remote tobacco history, continue Spiriva HLD -Continue Zocor Obesity -BMI 31, weight loss should be encourage Hyperglycemia -May be stress response. A1c is 5.9  Discharge Instructions  Allergies as of 07/13/2019      Reactions   Lisinopril Cough   Oxycodone Nausea And Vomiting      Medication List    TAKE these medications   albuterol 108 (90 Base) MCG/ACT inhaler Commonly known as: VENTOLIN HFA Inhale 1-2 puffs into the lungs every 6 (six) hours as needed for wheezing or shortness of breath.   amLODipine 10 MG tablet Commonly known as: NORVASC Take 1 tablet (10 mg total) by mouth daily.   aspirin EC 81 MG tablet Take 1 tablet (81 mg total) by mouth daily.   Brilinta 60 MG Tabs tablet Generic drug: ticagrelor TAKE 1 TABLET BY MOUTH TWICE DAILY What changed: how much to take   candesartan 16 MG tablet Commonly known as: ATACAND Take 1 tablet (16 mg total) by mouth daily. Please make annual appt with Dr. Burt Knack for refills. [phone].   carvedilol 6.25 MG tablet Commonly known as: COREG TAKE 1 TABLET BY MOUTH TWICE DAILY WITH MEALS What changed: when to take this   dexamethasone 6 MG tablet Commonly known as: DECADRON Take 1 tablet (6 mg total) by mouth daily.   fluticasone 50 MCG/ACT nasal spray Commonly known as: FLONASE Place 1 spray into both nostrils at bedtime.   guaiFENesin-dextromethorphan 100-10 MG/5ML syrup Commonly known as: ROBITUSSIN DM Take 10 mLs by mouth every 4 (four) hours as needed for cough.   omeprazole 40 MG capsule Commonly known as: PRILOSEC Take 1 capsule (40 mg  total) by mouth daily. Take daily before breakfast or lunch   simvastatin 40 MG tablet Commonly known as: ZOCOR Take 1 tablet (40 mg total) by mouth daily at 6 PM. What changed: when to take this   sulfamethoxazole-trimethoprim 800-160 MG tablet Commonly known as: BACTRIM DS Take 1 tablet by mouth 2 (two) times  daily.   tiotropium 18 MCG inhalation capsule Commonly known as: SPIRIVA Place 18 mcg into inhaler and inhale daily.            Durable Medical Equipment  (From admission, onward)         Start     Ordered   07/12/19 1418  DME Oxygen  (Discharge Planning)  Once    Question Answer Comment  Length of Need 6 Months   Mode or (Route) Nasal cannula   Liters per Minute 2   Frequency Continuous (stationary and portable oxygen unit needed)   Oxygen conserving device Yes   Oxygen delivery system Gas      07/12/19 1418         Follow-up Information    [place] [facility] Follow up.   Why: agency will provide home oxygen Contact information: [address] [place] [address] [phone]           Consultations:  None   Procedures/Studies:  DG Chest Portable 1 View  Result Date: 07/09/2019 CLINICAL DATA:  Shortness of breath. COVID positive. EXAM: PORTABLE CHEST 1 VIEW COMPARISON:  09/07/2015 FINDINGS: Heart is normal in size. Aortic atherosclerosis. Apical predominant emphysema. Minimal streaky opacity at the left lung base. Possible small pleural effusions. No pneumothorax. No acute osseous abnormalities. IMPRESSION: 1. Minimal streaky left lung base opacity, may be atelectasis or pneumonia involving of COVID-19. 2. Possible small pleural effusions. 3. Emphysema. Aortic Atherosclerosis (ICD10-I70.0) and Emphysema (ICD10-J43.9). Electronically Signed   By: Keith Rake M.D.   On: 07/09/2019 04:01     Subjective: - no chest pain, shortness of breath, no abdominal pain, nausea or vomiting.   Discharge Exam: BP (!) 184/72   Pulse 65   Temp 98.5 F (36.9 C) (Oral)   Resp 20   Ht 5\' 4"  (1.626 m)   Wt 81.8 kg   SpO2 92%   BMI 30.95 kg/m   General: Pt is alert, awake, not in acute distress   The results of significant diagnostics from this hospitalization (including imaging, microbiology, ancillary and laboratory) are listed below for reference.      Microbiology: Recent Results (from the past 240 hour(s))  Respiratory Panel by RT PCR (Flu A&B, Covid) - Nasopharyngeal Swab     Status: Abnormal   Collection Time: 07/09/19  5:15 AM   Specimen: Nasopharyngeal Swab  Result Value Ref Range Status   SARS Coronavirus 2 by RT PCR POSITIVE (A) NEGATIVE Final    Comment: RESULT CALLED TO, READ BACK BY AND VERIFIED WITH: P PEICKERT ON [id number] AT 0700 BY NFIELDS (NOTE) SARS-CoV-2 target nucleic acids are DETECTED. SARS-CoV-2 RNA is generally detectable in upper respiratory specimens  during the acute phase of infection. Positive results are indicative of the presence of the identified virus, but do not rule out bacterial infection or co-infection with other pathogens not detected by the test. Clinical correlation with patient history and other diagnostic information is necessary to determine patient infection status. The expected result is Negative. Fact Sheet for Patients:  PinkCheek.be Fact Sheet for Healthcare Providers: GravelBags.it This test is not yet approved or cleared by the [place] and  has been authorized for detection and/or diagnosis of SARS-CoV-2 by FDA under an Emergency Use Authorization (EUA).  This EUA will remain in effect (meaning this test can be use d) for the duration of  the COVID-19 declaration under Section 564(b)(1) of the Act, 21 U.S.C. section 360bbb-3(b)(1), unless the authorization is terminated or revoked sooner.    Influenza A by PCR NEGATIVE NEGATIVE Final   Influenza B by PCR NEGATIVE NEGATIVE Final    Comment: (NOTE) The Xpert Xpress SARS-CoV-2/FLU/RSV assay is intended as an aid in  the diagnosis of influenza from Nasopharyngeal swab specimens and  should not be used as a sole basis for treatment. Nasal washings and  aspirates are unacceptable for Xpert Xpress SARS-CoV-2/FLU/RSV  testing. Fact Sheet for  Patients: PinkCheek.be Fact Sheet for Healthcare Providers: GravelBags.it This test is not yet approved or cleared by the [place] FDA and  has been authorized for detection and/or diagnosis of SARS-CoV-2 by  FDA under an Emergency Use Authorization (EUA). This EUA will remain  in effect (meaning this test can be used) for the duration of the  Covid-19 declaration under Section 564(b)(1) of the Act, 21  U.S.C. section 360bbb-3(b)(1), unless the authorization is  terminated or revoked. Performed at [facility], [place] [address] [place], [place] [address]      Labs: Basic Metabolic Panel: Recent Labs  Lab 07/09/19 0840 07/10/19 0050 07/11/19 0254 07/12/19 0329 07/13/19 0430  NA 135 139 141 141 140  K 3.7 3.5 3.3* 3.4* 3.5  CL 102 107 107 107 104  CO2 21* 23 24 24 25   GLUCOSE 105* 117* 115* 114* 100*  BUN 9 15 20 16 16   CREATININE 0.96 0.88 0.78 0.62 0.64  CALCIUM 8.7* 9.0 8.9 8.9 9.0  MG  --  1.9 1.8 1.9 2.0  PHOS  --  1.9* 3.7 3.8 2.8   Liver Function Tests: Recent Labs  Lab 07/09/19 0840 07/10/19 0050 07/11/19 0254 07/12/19 0329 07/13/19 0430  AST 28 23 17 20 18   ALT 20 17 16 16 17   ALKPHOS 73 68 58 58 58  BILITOT 0.4 0.1* 0.4 0.4 0.7  PROT 6.8 7.3 7.0 7.0 6.9  ALBUMIN 3.4* 3.6 3.5 3.5 3.7   CBC: Recent Labs  Lab 07/09/19 0355 07/09/19 0355 07/09/19 0400 07/10/19 0050 07/11/19 0254 07/12/19 0329 07/13/19 0430  WBC 5.5  --   --  4.1 6.5 7.2 9.1  NEUTROABS 4.0  --   --  2.4 4.7 5.6 6.4  HGB 14.6   < > 14.6 13.9 13.7 13.8 14.2  HCT 44.2   < > 43.0 42.4 42.5 42.2 42.4  MCV 92.7  --   --  92.4 93.6 91.9 90.2  PLT 191  --   --  210 227 281 285   < > = values in this interval not displayed.   CBG: No results for input(s): GLUCAP in the last 168 hours. Hgb A1c No results for input(s): HGBA1C in the last 72 hours. Lipid Profile No results for input(s): CHOL, HDL, LDLCALC, TRIG,  CHOLHDL, LDLDIRECT in the last 72 hours. Thyroid function studies No results for input(s): TSH, T4TOTAL, T3FREE, THYROIDAB in the last 72 hours.  Invalid input(s): FREET3 Urinalysis    Component Value Date/Time   COLORURINE YELLOW 01/20/2019 1430   APPEARANCEUR CLEAR 01/20/2019 1430   LABSPEC 1.015 01/20/2019 1430   PHURINE 8.0 01/20/2019 1430   GLUCOSEU NEGATIVE 01/20/2019 1430   GLUCOSEU NEGATIVE 01/05/2007 1248   HGBUR NEGATIVE  01/20/2019 1430   BILIRUBINUR NEGATIVE 01/20/2019 1430   KETONESUR NEGATIVE 01/20/2019 1430   PROTEINUR NEGATIVE 01/20/2019 1430   UROBILINOGEN 1.0 10/31/2014 0844   NITRITE NEGATIVE 01/20/2019 1430   LEUKOCYTESUR NEGATIVE 01/20/2019 1430    FURTHER DISCHARGE INSTRUCTIONS:   Get Medicines reviewed and adjusted: Please take all your medications with you for your next visit with your Primary MD   Laboratory/radiological data: Please request your Primary MD to go over all hospital tests and procedure/radiological results at the follow up, please ask your Primary MD to get all Hospital records sent to his/her office.   In some cases, they will be blood work, cultures and biopsy results pending at the time of your discharge. Please request that your primary care M.D. goes through all the records of your hospital data and follows up on these results.   Also Note the following: If you experience worsening of your admission symptoms, develop shortness of breath, life threatening emergency, suicidal or homicidal thoughts you must seek medical attention immediately by calling 911 or calling your MD immediately  if symptoms less severe.   You must read complete instructions/literature along with all the possible adverse reactions/side effects for all the Medicines you take and that have been prescribed to you. Take any new Medicines after you have completely understood and accpet all the possible adverse reactions/side effects.    Do not drive when taking Pain  medications or sleeping medications (Benzodaizepines)   Do not take more than prescribed Pain, Sleep and Anxiety Medications. It is not advisable to combine anxiety,sleep and pain medications without talking with your primary care practitioner   Special Instructions: If you have smoked or chewed Tobacco  in the last 2 yrs please stop smoking, stop any regular Alcohol  and or any Recreational drug use.   Wear Seat belts while driving.   Please note: You were cared for by a hospitalist during your hospital stay. Once you are discharged, your primary care physician will handle any further medical issues. Please note that NO REFILLS for any discharge medications will be authorized once you are discharged, as it is imperative that you return to your primary care physician (or establish a relationship with a primary care physician if you do not have one) for your post hospital discharge needs so that they can reassess your need for medications and monitor your lab values.  Time coordinating discharge: 20 minutes  SIGNED:  Marzetta Board, MD, PhD 07/13/2019, 9:59 AM

## 2019-07-13 NOTE — Care Management Important Message (Signed)
Important Message  Patient Details  Name: Brenda [name] MRN: [id number] Date of Birth: [birth year]   Medicare Important Message Given:  Yes - Important Message mailed due to current National Emergency  Verbal consent obtained due to current National Emergency  Relationship to patient: Brother/Sister Contact Name: Linus Salmons Call Date: 07/13/19  Time: 1138 Phone: [phone] Outcome: No Answer/Busy Important Message mailed to: Patient address on file    Delorse Lek 07/13/2019, 11:39 AM

## 2019-07-29 ENCOUNTER — Other Ambulatory Visit: Payer: Self-pay | Admitting: Cardiovascular Disease

## 2019-08-18 ENCOUNTER — Telehealth: Payer: Self-pay | Admitting: *Deleted

## 2019-08-18 NOTE — Telephone Encounter (Signed)
Dr. Fletcher Anon, this pt has significant hx of PAD with most recent  endovascular intervention (05/11/2019) on bilateral common iliac arteries into the external iliac arteries with stenting, prior renal artery stents with mild in-stent restenosis, patent prior common iliac artery stents with mild in-stent restenosis, also with hx of bilateral carotid endarterectomy.   Can Brilinta be held for colonoscopy/endoscopy?  Please send response back to P CV DIV PREOP  Thanks, Gae Bon

## 2019-08-18 NOTE — Telephone Encounter (Signed)
   Primary Cardiologist: Sherren Mocha, MD/ Dr. Fletcher Anon  Chart reviewed as part of pre-operative protocol coverage. Patient was contacted 08/18/2019 in reference to pre-operative risk assessment for pending surgery as outlined below.  Brenda [name] was last seen on 06/14/2019 by Dr. Fletcher Anon.  Since that day, Brenda [name] has done well with no new issues.  Per Dr. Fletcher Anon, Brenda [name] can be held for 5 days before the procedure and resume after.   Therefore, based on ACC/AHA guidelines, the patient would be at acceptable risk for the planned procedure without further cardiovascular testing.   I will route this recommendation to the requesting party via Epic fax function and remove from pre-op pool.  Please call with questions.  Brenda Perch, NP 08/18/2019, 2:09 PM

## 2019-08-18 NOTE — Telephone Encounter (Signed)
   [facility] Pre-operative Risk Assessment    Request for surgical clearance:  1. What type of surgery is being performed? COLON / ENDO  2. When is this surgery scheduled?  08/30/19   3. What type of clearance is required (medical clearance vs. Pharmacy clearance to hold med vs. Both)? BOTH  4. Are there any medications that need to be held prior to surgery and how long? BRILINTA  5. Practice name and name of physician performing surgery?  [facility] DAVID Alvester Chou   6. What is your office phone number [phone]      7.   What is your office fax number [phone]  8.   Anesthesia type (None, local, MAC, general) ? PROPOFOL   Devra Dopp 08/18/2019, 8:02 AM  _________________________________________________________________   (provider comments below)

## 2019-08-18 NOTE — Telephone Encounter (Signed)
Can hold Brilinta 5 days before and resume after.

## 2019-08-31 ENCOUNTER — Other Ambulatory Visit: Payer: Self-pay | Admitting: Cardiovascular Disease

## 2019-09-02 ENCOUNTER — Other Ambulatory Visit: Payer: Self-pay

## 2019-09-09 ENCOUNTER — Other Ambulatory Visit: Payer: Self-pay

## 2019-09-09 MED ORDER — AMLODIPINE BESYLATE 10 MG PO TABS: 10.0000 mg | ORAL_TABLET | Freq: Every day | ORAL | 2 refills | Status: DC

## 2019-09-20 ENCOUNTER — Other Ambulatory Visit: Payer: Self-pay | Admitting: Cardiovascular Disease

## 2019-09-21 MED ORDER — CANDESARTAN CILEXETIL 16 MG PO TABS: 16.0000 mg | ORAL_TABLET | Freq: Every day | ORAL | 0 refills | Status: DC

## 2019-10-19 ENCOUNTER — Other Ambulatory Visit: Payer: Self-pay | Admitting: Cardiovascular Disease

## 2019-10-30 ENCOUNTER — Other Ambulatory Visit: Payer: Self-pay | Admitting: Cardiovascular Disease

## 2019-11-20 ENCOUNTER — Other Ambulatory Visit: Payer: Self-pay | Admitting: Cardiovascular Disease

## 2019-12-20 ENCOUNTER — Ambulatory Visit: Payer: Medicare Other | Admitting: Cardiovascular Disease

## 2019-12-20 ENCOUNTER — Encounter: Payer: Self-pay | Admitting: Cardiovascular Disease

## 2019-12-20 ENCOUNTER — Other Ambulatory Visit: Payer: Self-pay

## 2019-12-20 VITALS — BP 140/78 | HR 76 | Temp 98.1°F | Ht 65.0 in | Wt 195.0 lb

## 2019-12-20 DIAGNOSIS — I10 Essential (primary) hypertension: Secondary | ICD-10-CM | POA: Diagnosis not present

## 2019-12-20 DIAGNOSIS — I779 Disorder of arteries and arterioles, unspecified: Secondary | ICD-10-CM | POA: Diagnosis not present

## 2019-12-20 DIAGNOSIS — I2510 Atherosclerotic heart disease of native coronary artery without angina pectoris: Secondary | ICD-10-CM

## 2019-12-20 DIAGNOSIS — I739 Peripheral vascular disease, unspecified: Secondary | ICD-10-CM

## 2019-12-20 DIAGNOSIS — E785 Hyperlipidemia, unspecified: Secondary | ICD-10-CM | POA: Diagnosis not present

## 2019-12-20 MED ORDER — ROSUVASTATIN CALCIUM 20 MG PO TABS: 20.0000 mg | ORAL_TABLET | Freq: Every day | ORAL | 3 refills | Status: DC

## 2019-12-20 NOTE — Patient Instructions (Signed)
Medication Instructions:   STOP SIMVASTATIN  START ROSUVASTATIN 20 MG ONCE DAILY  *If you need a refill on your cardiac medications before your next appointment, please call your pharmacy*   Lab Work:  Your physician recommends that you return for lab work in: [place]  If you have labs (blood work) drawn today and your tests are completely normal, you will receive your results only by: Marland Kitchen MyChart Message (if you have MyChart) OR . A paper copy in the mail If you have any lab test that is abnormal or we need to change your treatment, we will call you to review the results.   Testing/Procedures:  Your physician has requested that you have an ankle brachial index (ABI). During this test an ultrasound and blood pressure cuff are used to evaluate the arteries that supply the arms and legs with blood. Allow thirty minutes for this exam. There are no restrictions or special instructions.[place] physician has requested that you have an Aorta/Iliac Duplex. This will be take place at [facility], [address].  No food after 11PM the night before.  Water is OK. (Don't drink liquids if you have been instructed not to for ANOTHER test).  Take two Extra-Strength Gas-X capsules at bedtime the night before test.   Take an additional two Extra-Strength Gas-X capsules three (3) hours before the test or first thing in the morning.    Avoid foods that produce bowel gas, for 24 hours prior to exam (see below).    No breakfast, no chewing gum, no smoking or carbonated beverages.  Patient may take morning medications with water.  Come in for test at least 15 minutes early to register.   Follow-Up: At [facility], you and your health needs are our priority.  As part of our continuing mission to provide you with exceptional heart care, we have created designated Provider Care Teams.  These Care Teams include your primary Cardiologist (physician) and Advanced  Practice Providers (APPs -  Physician Assistants and Nurse Practitioners) who all work together to provide you with the care you need, when you need it.  We recommend signing up for the patient portal called "MyChart".  Sign up information is provided on this After Visit Summary.  MyChart is used to connect with patients for Virtual Visits (Telemedicine).  Patients are able to view lab/test results, encounter notes, upcoming appointments, etc.  Non-urgent messages can be sent to your provider as well.   To learn more about what you can do with MyChart, go to [email or web address].    Your next appointment:   6 month(s)  The format for your next appointment:   In Person  Provider:   You may see DR Fletcher Anon or one of the following Advanced Practice Providers on your designated Care Team:    Kerin Ransom, PA-C  [place], [place]  Coletta Memos, [place]   COVID-19 Vaccine Information can be found at: ShippingScam.co.uk For questions related to vaccine distribution or appointments, please email [email or web address] or call [phone].

## 2019-12-20 NOTE — Progress Notes (Signed)
Cardiology Office Note   Date:  12/20/2019   ID:  Brenda [name], DOB 21-Feb-1961, MRN [id number]  PCP:  [facility]  Cardiologist:  Dr. Burt Knack  Chief Complaint  Patient presents with  . Shortness of Breath    going up and down steps      History of Present Illness: Brenda [name] is a 59 y.o. female who is here today for follow-up regarding peripheral arterial disease. She has multiple medical problems including coronary artery disease, chronic systolic heart failure with subsequent improvement in ejection fraction, COPD, previous tobacco use, hypertension, hyperlipidemia and extensive peripheral arterial disease. She had previous right renal artery stenting in 0347 which was complicated by wire induced perforation that was treated with coils.  She had in-stent restenosis that was subsequently treated with covered stent placement. She also has known history of carotid disease status post bilateral carotid endarterectomy and right carotid to subclavian artery bypass.  She was seen earlier this year for worsening claudication due to iliac disease. Vascular studies last year showed progression of iliac disease with velocity of 529 in the right common iliac and 466 in the left common iliac.  ABI was borderline reduced.  I performed angiography in December 2020 which showed patent renal artery stent with mild in-stent restenosis, patent bilateral kissing common iliac artery stents extending into the distal aorta with mild in-stent restenosis with significant disease distal to the stents.  2 additional self-expanding stents were placed bilaterally.   She was hospitalized in January with COVID-19 infection.  She recovered with no residual symptoms.  She has been doing reasonably well with no recent chest pain.  She describes stable exertional dyspnea.  No leg claudication.   Past Medical History:  Diagnosis Date  . Anemia   . Arthritis   . CAD (coronary  artery disease)   . Cardiomyopathy   . Carotid artery occlusion   . CHF (congestive heart failure) ([place]) 2007  . COPD (chronic obstructive pulmonary disease) (HCC)    not on home o2  . HTN (hypertension)   . Hypercholesteremia   . Pulmonary nodule   . PVD (peripheral vascular disease) ([place])   . Renal artery stenosis [facility])     Past Surgical History:  Procedure Laterality Date  . ABDOMINAL AORTOGRAM W/LOWER EXTREMITY N/A 05/11/2019   Procedure: ABDOMINAL AORTOGRAM W/LOWER EXTREMITY;  Surgeon: Wellington Hampshire, MD;  Location: [place] [facility];  Service: Cardiovascular;  Laterality: N/A;  . CARDIAC CATHETERIZATION     2007  . CHOLECYSTECTOMY N/A 03/01/2019   Procedure: LAPAROSCOPIC CHOLECYSTECTOMY;  Surgeon: Ralene Ok, MD;  Location: [place];  Service: General;  Laterality: N/A;  . CORONARY STENT PLACEMENT      Successful stenting of the right renal ostium   complicated by distal wire perforation.  The wire perforation was  treated with embolization of the branch vessels involved.  Will check CT   scan to assess the right renal parenchymal hematoma.  She will be   maintained on both aspirin and Plavix.  Will place a Foley catheter to   avoid clotting since hematuria is likely.    Marland Kitchen EXTERNAL EAR SURGERY     as a child  . PERIPHERAL VASCULAR INTERVENTION  05/11/2019   Procedure: PERIPHERAL VASCULAR INTERVENTION;  Surgeon: Wellington Hampshire, MD;  Location: [place] [facility];  Service: Cardiovascular;;  Bilater Iliac     Current Outpatient Medications  Medication Sig Dispense Refill  . Acetaminophen 500  MG capsule Take 1 capsule by mouth as needed for fever.    Marland Kitchen albuterol (PROVENTIL HFA;VENTOLIN HFA) 108 (90 Base) MCG/ACT inhaler Inhale 1-2 puffs into the lungs every 6 (six) hours as needed for wheezing or shortness of breath. 1 Inhaler 1  . amLODipine (NORVASC) 10 MG tablet Take 1 tablet (10 mg total) by mouth daily. 90 tablet 2  . aspirin EC 81 MG tablet Take 1 tablet (81 mg  total) by mouth daily. 150 tablet 2  . BRILINTA 60 MG TABS tablet Take 1 tablet by mouth twice daily 60 tablet 8  . candesartan (ATACAND) 16 MG tablet Take 1 tablet (16 mg total) by mouth daily. 90 tablet 1  . carvedilol (COREG) 6.25 MG tablet TAKE 1 TABLET BY MOUTH TWICE DAILY WITH MEALS 180 tablet 1  . dexamethasone (DECADRON) 6 MG tablet Take 1 tablet (6 mg total) by mouth daily. 5 tablet 0  . fluticasone (FLONASE) 50 MCG/ACT nasal spray Place 1 spray into both nostrils at bedtime.    Marland Kitchen guaiFENesin-dextromethorphan (ROBITUSSIN DM) 100-10 MG/5ML syrup Take 10 mLs by mouth every 4 (four) hours as needed for cough. 118 mL 0  . omeprazole (PRILOSEC) 40 MG capsule Take 1 capsule (40 mg total) by mouth daily. Take daily before breakfast or lunch 30 capsule 3  . simvastatin (ZOCOR) 40 MG tablet Take 1 tablet (40 mg total) by mouth daily at 6 PM. (Patient taking differently: Take 40 mg by mouth at bedtime. ) 90 tablet 3  . sulfamethoxazole-trimethoprim (BACTRIM DS) 800-160 MG tablet Take 1 tablet by mouth 2 (two) times daily.    Marland Kitchen tiotropium (SPIRIVA) 18 MCG inhalation capsule Place 18 mcg into inhaler and inhale daily.      Current Facility-Administered Medications  Medication Dose Route Frequency Provider Last Rate Last Admin  . sodium chloride flush (NS) 0.9 % injection 3 mL  3 mL Intravenous Q12H Wellington Hampshire, MD        Allergies:   Lisinopril and Oxycodone    Social History:  The patient  reports that she quit smoking about 14 years ago. Her smoking use included cigarettes. She has a 31.00 pack-year smoking history. She has never used smokeless tobacco. She reports that she does not drink alcohol and does not use drugs.   Family History:  The patient's family history includes Cancer in her father; Coronary artery disease in an other family member; Heart disease (age of onset: 80) in her mother; Hyperlipidemia in her father and mother; Hypertension in her father and mother; Peripheral  vascular disease in her mother.    ROS:  Please see the history of present illness.   Otherwise, review of systems are positive for none.   All other systems are reviewed and negative.    PHYSICAL EXAM: VS:  BP 140/78 (BP Location: Right Arm, Patient Position: Sitting, Cuff Size: Normal)   Pulse 76   Temp 98.1 F (36.7 C)   Ht 5\' 5"  (1.651 m)   Wt 195 lb (88.5 kg)   BMI 32.45 kg/m  , BMI Body mass index is 32.45 kg/m. GEN: Well nourished, well developed, in no acute distress  HEENT: normal  Neck: no JVD or masses.  Right carotid bruit Cardiac: RRR; no rubs, or gallops,no edema .  2 /6 systolic murmur in the aortic area Respiratory:  clear to auscultation bilaterally, normal work of breathing GI: soft, nontender, nondistended, + BS MS: no deformity or atrophy  Skin: warm and dry, no rash Neuro:  Strength and sensation are intact Psych: euthymic mood, full affect Vascular: Femoral pulse is mildly diminished bilaterally.    EKG:  EKG is not ordered today.   Recent Labs: 07/13/2019: ALT 17; BUN 16; Creatinine, Ser 0.64; Hemoglobin 14.2; Magnesium 2.0; Platelets 285; Potassium 3.5; Sodium 140    Lipid Panel    Component Value Date/Time   CHOL 135 03/27/2017 0934   TRIG 134 03/27/2017 0934   HDL 41 03/27/2017 0934   CHOLHDL 3.3 03/27/2017 0934   CHOLHDL 3.8 03/04/2016 0959   VLDL 32 (H) 03/04/2016 0959   LDLCALC 67 03/27/2017 0934      Wt Readings from Last 3 Encounters:  12/20/19 195 lb (88.5 kg)  07/09/19 180 lb 5.4 oz (81.8 kg)  06/14/19 183 lb (83 kg)      No flowsheet data found.    ASSESSMENT AND PLAN:   1.  Peripheral arterial disease: Status post  endovascular intervention on bilateral common iliac arteries into the external iliac arteries.  Currently with no claudications.  She is due for repeat ABI and aortoiliac duplex which was requested today.  2.  Carotid artery disease: Followed by [facility].  Most recent carotid Doppler in our office in October  showed less than 40% stenosis bilaterally.  There was patent right carotid to subclavian artery graft.  3.  Hyperlipidemia: Currently on simvastatin 40 mg daily.    Most recent lipid profile showed an LDL of 76.  Given extensive cardiovascular disease, I elected to switch her from simvastatin to rosuvastatin 20 mg daily.  Recheck lipid and liver profile in 6 weeks.  4.  Essential hypertension: Blood pressure is reasonably controlled.  5.  Left subclavian artery stenosis: She denies left arm claudication.  Thus, no indication for revascularization.  Blood pressure should always be checked in the right arm.  6.  Coronary artery disease: Currently with no anginal symptoms.  She has chronic left bundle branch block.  7.  COVID-19 education: The patient was hospitalized in January with COVID-19.  She has not received the vaccine as of yet but she is thinking about it.  I answered all her questions and strongly advised her to get vaccinated.    Disposition:   FU with me in 6 months  Signed,  Kathlyn Sacramento, MD  12/20/2019 8:32 AM    [place]

## 2020-01-06 LAB — LIPID PANEL
Chol/HDL Ratio: 3.2 ratio (ref 0.0–4.4)
Cholesterol, Total: 135 mg/dL (ref 100–199)
HDL: 42 mg/dL (ref >39)
LDL Chol Calc (NIH): 54 mg/dL (ref 0–99)
Triglycerides: 247 mg/dL — ABNORMAL HIGH (ref 0–149)
VLDL Cholesterol Cal: 39 mg/dL (ref 5–40)

## 2020-01-06 LAB — HEPATIC FUNCTION PANEL
ALT: 9 IU/L (ref 0–32)
AST: 11 IU/L (ref 0–40)
Albumin: 4.5 g/dL (ref 3.8–4.9)
Alkaline Phosphatase: 113 IU/L (ref 48–121)
Bilirubin Total: 0.3 mg/dL (ref 0.0–1.2)
Bilirubin, Direct: 0.08 mg/dL (ref 0.00–0.40)
Total Protein: 7.2 g/dL (ref 6.0–8.5)

## 2020-01-26 ENCOUNTER — Ambulatory Visit (HOSPITAL_COMMUNITY)
Admission: RE | Admit: 2020-01-26 | Payer: Medicare Other | Source: Ambulatory Visit | Attending: Cardiovascular Disease | Admitting: Cardiovascular Disease

## 2020-02-14 ENCOUNTER — Other Ambulatory Visit: Payer: Self-pay

## 2020-02-14 ENCOUNTER — Other Ambulatory Visit (HOSPITAL_COMMUNITY): Payer: Self-pay | Admitting: Cardiovascular Disease

## 2020-02-14 ENCOUNTER — Ambulatory Visit (HOSPITAL_BASED_OUTPATIENT_CLINIC_OR_DEPARTMENT_OTHER)
Admission: RE | Admit: 2020-02-14 | Discharge: 2020-02-14 | Disposition: A | Discharge status: Home / Self Care | Payer: Medicare Other | Source: Ambulatory Visit | Attending: Cardiovascular Disease | Admitting: Cardiovascular Disease

## 2020-02-14 ENCOUNTER — Ambulatory Visit (HOSPITAL_COMMUNITY)
Admission: RE | Admit: 2020-02-14 | Discharge: 2020-02-14 | Disposition: A | Discharge status: Home / Self Care | Payer: Medicare Other | Source: Ambulatory Visit | Attending: Cardiovascular Disease | Admitting: Cardiovascular Disease

## 2020-02-14 DIAGNOSIS — I739 Peripheral vascular disease, unspecified: Secondary | ICD-10-CM | POA: Insufficient documentation

## 2020-02-14 DIAGNOSIS — Z95828 Presence of other vascular implants and grafts: Secondary | ICD-10-CM | POA: Insufficient documentation

## 2020-02-14 DIAGNOSIS — Z9889 Other specified postprocedural states: Secondary | ICD-10-CM

## 2020-02-14 DIAGNOSIS — I6523 Occlusion and stenosis of bilateral carotid arteries: Secondary | ICD-10-CM

## 2020-02-14 DIAGNOSIS — I701 Atherosclerosis of renal artery: Secondary | ICD-10-CM

## 2020-02-17 ENCOUNTER — Other Ambulatory Visit: Payer: Self-pay | Admitting: *Deleted

## 2020-02-17 DIAGNOSIS — I739 Peripheral vascular disease, unspecified: Secondary | ICD-10-CM

## 2020-02-28 ENCOUNTER — Other Ambulatory Visit: Payer: Self-pay | Admitting: Cardiovascular Disease

## 2020-03-09 ENCOUNTER — Other Ambulatory Visit: Payer: Self-pay | Admitting: Cardiovascular Disease

## 2020-03-13 MED ORDER — CANDESARTAN CILEXETIL 16 MG PO TABS: 16.0000 mg | ORAL_TABLET | Freq: Every day | ORAL | 0 refills | Status: DC

## 2020-03-15 ENCOUNTER — Encounter (HOSPITAL_BASED_OUTPATIENT_CLINIC_OR_DEPARTMENT_OTHER): Payer: Self-pay | Admitting: Emergency Medicine

## 2020-03-15 ENCOUNTER — Emergency Department (HOSPITAL_BASED_OUTPATIENT_CLINIC_OR_DEPARTMENT_OTHER): Payer: Medicare Other

## 2020-03-15 ENCOUNTER — Emergency Department (HOSPITAL_BASED_OUTPATIENT_CLINIC_OR_DEPARTMENT_OTHER)
Admission: EM | Admit: 2020-03-15 | Discharge: 2020-03-15 | Disposition: A | Discharge status: Home / Self Care | Payer: Medicare Other | Attending: Emergency Medicine | Admitting: Emergency Medicine

## 2020-03-15 ENCOUNTER — Other Ambulatory Visit: Payer: Self-pay

## 2020-03-15 DIAGNOSIS — I110 Hypertensive heart disease with heart failure: Secondary | ICD-10-CM | POA: Insufficient documentation

## 2020-03-15 DIAGNOSIS — I5032 Chronic diastolic (congestive) heart failure: Secondary | ICD-10-CM | POA: Insufficient documentation

## 2020-03-15 DIAGNOSIS — R109 Unspecified abdominal pain: Secondary | ICD-10-CM | POA: Insufficient documentation

## 2020-03-15 DIAGNOSIS — Z79899 Other long term (current) drug therapy: Secondary | ICD-10-CM | POA: Insufficient documentation

## 2020-03-15 DIAGNOSIS — Z8616 Personal history of COVID-19: Secondary | ICD-10-CM | POA: Insufficient documentation

## 2020-03-15 DIAGNOSIS — Z7951 Long term (current) use of inhaled steroids: Secondary | ICD-10-CM | POA: Insufficient documentation

## 2020-03-15 DIAGNOSIS — Z7982 Long term (current) use of aspirin: Secondary | ICD-10-CM | POA: Diagnosis not present

## 2020-03-15 DIAGNOSIS — I2510 Atherosclerotic heart disease of native coronary artery without angina pectoris: Secondary | ICD-10-CM | POA: Diagnosis not present

## 2020-03-15 DIAGNOSIS — J441 Chronic obstructive pulmonary disease with (acute) exacerbation: Secondary | ICD-10-CM | POA: Insufficient documentation

## 2020-03-15 DIAGNOSIS — Z87891 Personal history of nicotine dependence: Secondary | ICD-10-CM | POA: Diagnosis not present

## 2020-03-15 LAB — COMPREHENSIVE METABOLIC PANEL
ALT: 16 U/L (ref 0–44)
AST: 18 U/L (ref 15–41)
Albumin: 4.7 g/dL (ref 3.5–5.0)
Alkaline Phosphatase: 86 U/L (ref 38–126)
Anion gap: 11 (ref 5–15)
BUN: 11 mg/dL (ref 6–20)
CO2: 24 mmol/L (ref 22–32)
Calcium: 9.9 mg/dL (ref 8.9–10.3)
Chloride: 103 mmol/L (ref 98–111)
Creatinine, Ser: 0.77 mg/dL (ref 0.44–1.00)
GFR calc non Af Amer: >60 mL/min (ref >60)
Glucose, Bld: 101 mg/dL — ABNORMAL HIGH (ref 70–99)
Potassium: 4.2 mmol/L (ref 3.5–5.1)
Sodium: 138 mmol/L (ref 135–145)
Total Bilirubin: 0.5 mg/dL (ref 0.3–1.2)
Total Protein: 8.6 g/dL — ABNORMAL HIGH (ref 6.5–8.1)

## 2020-03-15 LAB — URINALYSIS, ROUTINE W REFLEX MICROSCOPIC
Bilirubin Urine: NEGATIVE
Glucose, UA: NEGATIVE mg/dL
Ketones, ur: NEGATIVE mg/dL
Leukocytes,Ua: NEGATIVE
Nitrite: NEGATIVE
Protein, ur: NEGATIVE mg/dL
Specific Gravity, Urine: 1.01 (ref 1.005–1.030)
pH: 6.5 (ref 5.0–8.0)

## 2020-03-15 LAB — URINALYSIS, MICROSCOPIC (REFLEX)

## 2020-03-15 LAB — LIPASE, BLOOD: Lipase: 41 U/L (ref 11–51)

## 2020-03-15 MED ORDER — ACETAMINOPHEN 500 MG PO TABS
1000.0000 mg | ORAL_TABLET | Freq: Once | ORAL | Status: AC
  Administered 2020-03-15: 1000 mg via ORAL
  Filled 2020-03-15: qty 2

## 2020-03-15 NOTE — Discharge Instructions (Addendum)
It was our pleasure to provide your ER care today - we hope that you feel better.  Take acetaminophen or ibuprofen as need.   You ct scan was read as showing possible small stones in the biliary/cystic duct - follow up with your GI doctor Alvester Chou) in the next couple weeks - call office to arrange appointment.   Return to ER if worse, new symptoms, fevers, worsening or severe pain, persistent vomiting, or other concern.

## 2020-03-15 NOTE — ED Notes (Signed)
Patient transported to CT 

## 2020-03-15 NOTE — ED Triage Notes (Addendum)
Rt side abd pain x 1 week denies dysuria, no v/n/d has appointment on 18  Cannot wait has hx of renal stenosis had [place] she states  1 month ago all ok

## 2020-03-15 NOTE — ED Provider Notes (Signed)
[facility] EMERGENCY DEPARTMENT Provider Note   CSN: [id number] Arrival date & time: 03/15/20  1102     History Chief Complaint  Patient presents with  . Abdominal Pain    JANIQUE HOEFER is a 59 y.o. female.  Patient c/o right flank pain. Symptoms acute onset 1 week ago, dull, constant, waxing/waning, mod-severe, non radiating. No associated nausea, vomiting, or diarrhea. No rash or skin lesions to area of pain. Remote hx kidney stones. No dysuria or hematuria. Prior cholecystectomy. States had right flank earlier in year and her GI doctor did EGD and colonoscopy that patients indicates was normal. No lower abd or pelvic pain. No vaginal discharge or bleeding. No chest pain or discomfort. No sob. No pleuritic pain. No cough or uri symptoms. No fever or chills.   The history is provided by the patient.  Abdominal Pain Associated symptoms: no chest pain, no chills, no constipation, no cough, no diarrhea, no dysuria, no fever, no shortness of breath, no sore throat and no vomiting        Past Medical History:  Diagnosis Date  . Anemia   . Arthritis   . CAD (coronary artery disease)   . Cardiomyopathy   . Carotid artery occlusion   . CHF (congestive heart failure) ([place]) 2007  . COPD (chronic obstructive pulmonary disease) ([facility])    not on home o2  . HTN (hypertension)   . Hypercholesteremia   . Pulmonary nodule   . PVD (peripheral vascular disease) ([place])   . Renal artery stenosis [facility])     Patient Active Problem List   Diagnosis Date Noted  . Pneumonia 07/09/2019  . Acute respiratory disease due to COVID-19 virus 07/09/2019  . Obesity (BMI 30.0-34.9) 07/09/2019  . Chronic diastolic CHF (congestive heart failure) ([place]) 11/02/2014  . Acute respiratory failure with hypoxia ([place]) 11/01/2014  . COPD exacerbation ([place]) 10/31/2014  . Hyperglycemia 10/31/2014  . Polycythemia 10/31/2014  . Strep throat   . Research subject 10/10/2011  . ANEMIA, CHRONIC 11/03/2008    . Essential hypertension 11/03/2008  . Coronary atherosclerosis 11/03/2008  . CAROTID ARTERY OCCLUSION 11/03/2008  . PULMONARY NODULE, SOLITARY 11/03/2008  . HYPERCHOLESTEROLEMIA 10/20/2008  . CARDIOMYOPATHY 10/20/2008  . COPD (chronic obstructive pulmonary disease) ([place]) 10/20/2008  . RENAL ARTERY STENOSIS 10/09/2008  . Atherosclerosis of native arteries of the extremities with intermittent claudication 10/09/2008    Past Surgical History:  Procedure Laterality Date  . ABDOMINAL AORTOGRAM W/LOWER EXTREMITY N/A 05/11/2019   Procedure: ABDOMINAL AORTOGRAM W/LOWER EXTREMITY;  Surgeon: Wellington Hampshire, MD;  Location: [place] [facility];  Service: Cardiovascular;  Laterality: N/A;  . CARDIAC CATHETERIZATION     2007  . CHOLECYSTECTOMY N/A 03/01/2019   Procedure: LAPAROSCOPIC CHOLECYSTECTOMY;  Surgeon: Ralene Ok, MD;  Location: [place];  Service: General;  Laterality: N/A;  . CORONARY STENT PLACEMENT      Successful stenting of the right renal ostium   complicated by distal wire perforation.  The wire perforation was  treated with embolization of the branch vessels involved.  Will check CT   scan to assess the right renal parenchymal hematoma.  She will be   maintained on both aspirin and Plavix.  Will place a Foley catheter to   avoid clotting since hematuria is likely.    Marland Kitchen EXTERNAL EAR SURGERY     as a child  . PERIPHERAL VASCULAR INTERVENTION  05/11/2019   Procedure: PERIPHERAL VASCULAR INTERVENTION;  Surgeon: Wellington Hampshire, MD;  Location: [facility];  Service: Cardiovascular;;  Bilater Iliac     OB History   No obstetric history on file.     Family History  Problem Relation Age of Onset  . Heart disease Mother 25       Heart Disease before age 29  . Hypertension Mother   . Hyperlipidemia Mother   . Peripheral vascular disease Mother   . Cancer Father   . Hyperlipidemia Father   . Hypertension Father   . Coronary artery disease Other     Social History    Tobacco Use  . Smoking status: Former Smoker    Packs/day: 1.00    Years: 31.00    Pack years: 31.00    Types: Cigarettes    Quit date: 06/09/2005    Years since quitting: 14.7  . Smokeless tobacco: Never Used  Vaping Use  . Vaping Use: Never used  Substance Use Topics  . Alcohol use: No    Alcohol/week: 0.0 standard drinks  . Drug use: No    Home Medications Prior to Admission medications   Medication Sig Start Date End Date Taking? Authorizing Provider  Acetaminophen 500 MG capsule Take 1 capsule by mouth as needed for fever.    [provider]  albuterol (PROVENTIL HFA;VENTOLIN HFA) 108 (90 Base) MCG/ACT inhaler Inhale 1-2 puffs into the lungs every 6 (six) hours as needed for wheezing or shortness of breath. 09/07/15   Nat Christen, MD  amLODipine (NORVASC) 10 MG tablet Take 1 tablet (10 mg total) by mouth daily. 09/09/19   Wellington Hampshire, MD  aspirin EC 81 MG tablet Take 1 tablet (81 mg total) by mouth daily. 05/11/19 05/10/20  Wellington Hampshire, MD  BRILINTA 60 MG TABS tablet Take 1 tablet by mouth twice daily 08/31/19   Wellington Hampshire, MD  candesartan (ATACAND) 16 MG tablet Take 1 tablet (16 mg total) by mouth daily. Please make overdue appt with Dr. Burt Knack before anymore refills. 1st attempt 03/13/20   Sherren Mocha, MD  carvedilol (COREG) 6.25 MG tablet TAKE 1 TABLET BY MOUTH TWICE DAILY WITH MEALS 02/28/20   Wellington Hampshire, MD  fluticasone (FLONASE) 50 MCG/ACT nasal spray Place 1 spray into both nostrils at bedtime. 07/05/19   [provider]  guaiFENesin-dextromethorphan (ROBITUSSIN DM) 100-10 MG/5ML syrup Take 10 mLs by mouth every 4 (four) hours as needed for cough. 07/13/19   Caren Griffins, MD  omeprazole (PRILOSEC) 40 MG capsule Take 1 capsule (40 mg total) by mouth daily. Take daily before breakfast or lunch 09/01/18   Milus Banister, MD  rosuvastatin (CRESTOR) 20 MG tablet Take 1 tablet (20 mg total) by mouth daily. 12/20/19 03/19/20  Wellington Hampshire, MD  tiotropium (SPIRIVA) 18 MCG inhalation capsule Place 18 mcg into inhaler and inhale daily.     [provider]    Allergies    Lisinopril and Oxycodone  Review of Systems   Review of Systems  Constitutional: Negative for chills and fever.  HENT: Negative for sore throat.   Eyes: Negative for redness.  Respiratory: Negative for cough and shortness of breath.   Cardiovascular: Negative for chest pain.  Gastrointestinal: Positive for abdominal pain. Negative for constipation, diarrhea and vomiting.  Genitourinary: Negative for dysuria and flank pain.  Musculoskeletal: Negative for back pain and neck pain.  Skin: Negative for rash.  Neurological: Negative for headaches.  Hematological: Does not bruise/bleed easily.  Psychiatric/Behavioral: Negative for confusion.    Physical Exam Updated Vital Signs  BP (!) 160/97 (BP Location: Right Arm)   Pulse 78   Temp 97.8 F (36.6 C) (Oral)   Resp 16   Ht 1.651 m (5\' 5" )   Wt 88.5 kg   SpO2 98%   BMI 32.45 kg/m   Physical Exam Vitals and nursing note reviewed.  Constitutional:      Appearance: Normal appearance. She is well-developed.  HENT:     Head: Atraumatic.     Nose: Nose normal.     Mouth/Throat:     Mouth: Mucous membranes are moist.  Eyes:     General: No scleral icterus.    Conjunctiva/sclera: Conjunctivae normal.  Neck:     Trachea: No tracheal deviation.  Cardiovascular:     Rate and Rhythm: Normal rate and regular rhythm.     Pulses: Normal pulses.     Heart sounds: Normal heart sounds. No murmur heard.  No friction rub. No gallop.   Pulmonary:     Effort: Pulmonary effort is normal. No respiratory distress.     Breath sounds: Normal breath sounds.  Abdominal:     General: Bowel sounds are normal. There is no distension.     Palpations: Abdomen is soft. There is no mass.     Tenderness: There is no abdominal tenderness. There is no guarding or rebound.     Hernia: No hernia is  present.  Genitourinary:    Comments: No cva tenderness.  Musculoskeletal:        General: No swelling or tenderness.     Cervical back: Normal range of motion and neck supple. No rigidity. No muscular tenderness.     Right lower leg: No edema.     Left lower leg: No edema.  Skin:    General: Skin is warm and dry.     Findings: No rash.  Neurological:     Mental Status: She is alert.     Comments: Alert, speech normal.   Psychiatric:        Mood and Affect: Mood normal.     ED Results / Procedures / Treatments   Labs (all labs ordered are listed, but only abnormal results are displayed) Results for orders placed or performed during the hospital encounter of 03/15/20  Urinalysis, Routine w reflex microscopic Urine, Clean Catch  Result Value Ref Range   Color, Urine YELLOW YELLOW   APPearance CLEAR CLEAR   Specific Gravity, Urine 1.010 1.005 - 1.030   pH 6.5 5.0 - 8.0   Glucose, UA NEGATIVE NEGATIVE mg/dL   Hgb urine dipstick TRACE (A) NEGATIVE   Bilirubin Urine NEGATIVE NEGATIVE   Ketones, ur NEGATIVE NEGATIVE mg/dL   Protein, ur NEGATIVE NEGATIVE mg/dL   Nitrite NEGATIVE NEGATIVE   Leukocytes,Ua NEGATIVE NEGATIVE  Urinalysis, Microscopic (reflex)  Result Value Ref Range   RBC / HPF 0-5 0 - 5 RBC/hpf   WBC, UA 0-5 0 - 5 WBC/hpf   Bacteria, UA FEW (A) NONE SEEN   Squamous Epithelial / LPF 0-5 0 - 5  Comprehensive metabolic panel  Result Value Ref Range   Sodium 138 135 - 145 mmol/L   Potassium 4.2 3.5 - 5.1 mmol/L   Chloride 103 98 - 111 mmol/L   CO2 24 22 - 32 mmol/L   Glucose, Bld 101 (H) 70 - 99 mg/dL   BUN 11 6 - 20 mg/dL   Creatinine, Ser 0.77 0.44 - 1.00 mg/dL   Calcium 9.9 8.9 - 10.3 mg/dL   Total Protein 8.6 (H) 6.5 -  8.1 g/dL   Albumin 4.7 3.5 - 5.0 g/dL   AST 18 15 - 41 U/L   ALT 16 0 - 44 U/L   Alkaline Phosphatase 86 38 - 126 U/L   Total Bilirubin 0.5 0.3 - 1.2 mg/dL   GFR calc non Af Amer >60 >60 mL/min   Anion gap 11 5 - 15  Lipase, blood   Result Value Ref Range   Lipase 41 11 - 51 U/L   CT Renal Stone Study  Result Date: 03/15/2020 CLINICAL DATA:  Right-sided abdominal and flank pain, kidney stones suspected EXAM: CT ABDOMEN AND PELVIS WITHOUT CONTRAST TECHNIQUE: Multidetector CT imaging of the abdomen and pelvis was performed following the standard protocol without IV contrast. COMPARISON:  01/20/2019 FINDINGS: Lower chest: No acute abnormality. Stable, benign 4 mm pulmonary nodule of the right lung base (series 4, image 7). Hepatobiliary: No focal liver abnormality is seen. Status post interval cholecystectomy. Probable small gallstones present in a gallbladder or cystic duct remnant (series 2, image 26). No biliary dilatation. Pancreas: Unremarkable. No pancreatic ductal dilatation or surrounding inflammatory changes. Spleen: Normal in size without significant abnormality. Adrenals/Urinary Tract: Adrenal glands are unremarkable. There are metallic clips about the relatively atrophic right kidney. No calculi or hydronephrosis. Bladder is unremarkable. Stomach/Bowel: Stomach is within normal limits. Appendix appears normal. No evidence of bowel wall thickening, distention, or inflammatory changes. Sigmoid diverticulosis Vascular/Lymphatic: Aortic atherosclerosis. Bilateral renal artery stents. Bilateral common iliac artery stents. No enlarged abdominal or pelvic lymph nodes. Reproductive: No mass or other significant abnormality. Other: No abdominal wall hernia or abnormality. No abdominopelvic ascites. Musculoskeletal: No acute or significant osseous findings. IMPRESSION: 1. No acute noncontrast CT findings of the abdomen or pelvis to explain right-sided abdominal or flank pain. 2. Metallic clips are again present about the relatively atrophic right kidney. No urinary tract calculi or hydronephrosis. 3. Status post interval cholecystectomy. Probable small gallstones present in a gallbladder or cystic duct remnant. No biliary dilatation. 4.  Sigmoid diverticulosis without evidence of acute diverticulitis. 5. Aortic Atherosclerosis (ICD10-I70.0). Bilateral renal artery and common iliac artery stents. Electronically Signed   By: Eddie Candle M.D.   On: 03/15/2020 12:21    EKG None  Radiology No results found.  Procedures Procedures (including critical care time)  Medications Ordered in ED Medications - No data to display  ED Course  I have reviewed the triage vital signs and the nursing notes.  Pertinent labs & imaging results that were available during my care of the patient were reviewed by me and considered in my medical decision making (see chart for details).    MDM Rules/Calculators/A&P                         Labs sent. Imaging ordered.  Reviewed nursing notes and prior charts for additional history.   Labs reviewed/interpreted by me - ua neg for infection.   CT reviewed/interpreted by me - no ureteral stone, ?small stone in cystic duct - discussed w pt. No bil dil.   Additional labs reviewed/interpreted by me - lft normal. Lipase normal.   Acetaminophen po.   Pt appears stable for d/c.   Rec pcp/GI f/u. Recheck bp.   Return precautions provided.    Final Clinical Impression(s) / ED Diagnoses Final diagnoses:  None    Rx / DC Orders ED Discharge Orders    None       Lajean Saver, MD 03/15/20 1414

## 2020-05-28 ENCOUNTER — Other Ambulatory Visit: Payer: Self-pay | Admitting: Cardiovascular Disease

## 2020-06-26 ENCOUNTER — Ambulatory Visit: Payer: Medicare Other | Admitting: Cardiovascular Disease

## 2020-06-29 ENCOUNTER — Other Ambulatory Visit: Payer: Self-pay | Admitting: Cardiovascular Disease

## 2020-06-29 NOTE — Telephone Encounter (Signed)
Please review for refill on Brilinta. Thanks!

## 2020-07-17 ENCOUNTER — Ambulatory Visit: Payer: Medicare Other | Admitting: Cardiovascular Disease

## 2020-07-17 ENCOUNTER — Encounter: Payer: Self-pay | Admitting: Cardiovascular Disease

## 2020-07-17 ENCOUNTER — Other Ambulatory Visit: Payer: Self-pay

## 2020-07-17 VITALS — BP 128/62 | HR 70 | Ht 65.0 in | Wt 199.4 lb

## 2020-07-17 DIAGNOSIS — E785 Hyperlipidemia, unspecified: Secondary | ICD-10-CM

## 2020-07-17 DIAGNOSIS — I739 Peripheral vascular disease, unspecified: Secondary | ICD-10-CM | POA: Diagnosis not present

## 2020-07-17 DIAGNOSIS — I10 Essential (primary) hypertension: Secondary | ICD-10-CM

## 2020-07-17 DIAGNOSIS — I6523 Occlusion and stenosis of bilateral carotid arteries: Secondary | ICD-10-CM | POA: Diagnosis not present

## 2020-07-17 DIAGNOSIS — I2510 Atherosclerotic heart disease of native coronary artery without angina pectoris: Secondary | ICD-10-CM

## 2020-07-17 NOTE — Patient Instructions (Signed)
Medication Instructions:  Continue current medications  *If you need a refill on your cardiac medications before your next appointment, please call your pharmacy*   Lab Work: None Ordered   Testing/Procedures: Your physician has requested that you have a carotid duplex. This test is an ultrasound of the carotid arteries in your neck. It looks at blood flow through these arteries that supply the brain with blood. Allow one hour for this exam. There are no restrictions or special instructions.   Follow-Up: At [facility], you and your health needs are our priority.  As part of our continuing mission to provide you with exceptional heart care, we have created designated Provider Care Teams.  These Care Teams include your primary Cardiologist (physician) and Advanced Practice Providers (APPs -  Physician Assistants and Nurse Practitioners) who all work together to provide you with the care you need, when you need it.  We recommend signing up for the patient portal called "MyChart".  Sign up information is provided on this After Visit Summary.  MyChart is used to connect with patients for Virtual Visits (Telemedicine).  Patients are able to view lab/test results, encounter notes, upcoming appointments, etc.  Non-urgent messages can be sent to your provider as well.   To learn more about what you can do with MyChart, go to [email or web address].    Your next appointment:   1 year(s)  The format for your next appointment:   In Person  Provider:   You may see Dr Fletcher Anon or one of the following Advanced Practice Providers on your designated Care Team:    Kerin Ransom, PA-C  [place], [place]  Coletta Memos, [place]

## 2020-07-17 NOTE — Progress Notes (Signed)
Cardiology Office Note   Date:  07/17/2020   ID:  Brenda Brenda, DOB 1960/06/19, MRN [id number]  PCP:  [facility]  Cardiologist:  Dr. Burt Knack  No chief complaint on file.     History of Present Illness: Brenda Brenda is a 60 y.o. female who is here today for follow-up regarding peripheral arterial disease. She has multiple medical problems including coronary artery disease, chronic systolic heart failure with subsequent improvement in ejection fraction, COPD, previous tobacco use, hypertension, hyperlipidemia and extensive peripheral arterial disease. She had previous right renal artery stenting in 9371 which was complicated by wire induced perforation that was treated with coils.  She had in-stent restenosis that was subsequently treated with covered stent placement. She also has known history of carotid disease status post bilateral carotid endarterectomy and right carotid to subclavian artery bypass.  She was seen in 2020 for worsening claudication due to iliac disease.  Angiography in December 2020  showed patent renal artery stent with mild in-stent restenosis, patent bilateral kissing common iliac artery stents extending into the distal aorta with mild in-stent restenosis with significant disease distal to the stents.  2 additional self-expanding stents were placed bilaterally.   She was hospitalized in January of 2021 with COVID-19 infection.  She had Covid again last month but did not require hospitalization.  She has been doing well with no recent chest pain or worsening dyspnea.  No leg claudication.  Past Medical History:  Diagnosis Date  . Anemia   . Arthritis   . CAD (coronary artery disease)   . Cardiomyopathy   . Carotid artery occlusion   . CHF (congestive heart failure) ([place]) 2007  . COPD (chronic obstructive pulmonary disease) ([facility])    not on home o2  . HTN (hypertension)   . Hypercholesteremia   . Pulmonary nodule   . PVD  (peripheral vascular disease) ([place])   . Renal artery stenosis [facility])     Past Surgical History:  Procedure Laterality Date  . ABDOMINAL AORTOGRAM W/LOWER EXTREMITY N/A 05/11/2019   Procedure: ABDOMINAL AORTOGRAM W/LOWER EXTREMITY;  Surgeon: Wellington Hampshire, MD;  Location: [place] [facility];  Service: Cardiovascular;  Laterality: N/A;  . CARDIAC CATHETERIZATION     2007  . CHOLECYSTECTOMY N/A 03/01/2019   Procedure: LAPAROSCOPIC CHOLECYSTECTOMY;  Surgeon: Ralene Ok, MD;  Location: [place];  Service: General;  Laterality: N/A;  . CORONARY STENT PLACEMENT      Successful stenting of the right renal ostium   complicated by distal wire perforation.  The wire perforation was  treated with embolization of the branch vessels involved.  Will check CT   scan to assess the right renal parenchymal hematoma.  She will be   maintained on both aspirin and Plavix.  Will place a Foley catheter to   avoid clotting since hematuria is likely.    Marland Kitchen EXTERNAL EAR SURGERY     as a child  . PERIPHERAL VASCULAR INTERVENTION  05/11/2019   Procedure: PERIPHERAL VASCULAR INTERVENTION;  Surgeon: Wellington Hampshire, MD;  Location: [place] [facility];  Service: Cardiovascular;;  Bilater Iliac     Current Outpatient Medications  Medication Sig Dispense Refill  . Acetaminophen 500 MG capsule Take 1 capsule by mouth as needed for fever.    Marland Kitchen albuterol (PROVENTIL HFA;VENTOLIN HFA) 108 (90 Base) MCG/ACT inhaler Inhale 1-2 puffs into the lungs every 6 (six) hours as needed for wheezing or shortness of breath. 1 Inhaler 1  .  amLODipine (NORVASC) 10 MG tablet Take 1 tablet (10 mg total) by mouth daily. 90 tablet 2  . BRILINTA 60 MG TABS tablet Take 1 tablet by mouth twice daily 60 tablet 0  . candesartan (ATACAND) 16 MG tablet Take 1 tablet (16 mg total) by mouth daily. Please make overdue appt with Dr. Burt Knack before anymore refills. 1st attempt 30 tablet 0  . carvedilol (COREG) 6.25 MG tablet TAKE 1 TABLET BY MOUTH TWICE  DAILY WITH MEALS 180 tablet 0  . fluticasone (FLONASE) 50 MCG/ACT nasal spray Place 1 spray into both nostrils at bedtime.    Marland Kitchen guaiFENesin-dextromethorphan (ROBITUSSIN DM) 100-10 MG/5ML syrup Take 10 mLs by mouth every 4 (four) hours as needed for cough. 118 mL 0  . omeprazole (PRILOSEC) 40 MG capsule Take 1 capsule (40 mg total) by mouth daily. Take daily before breakfast or lunch 30 capsule 3  . rosuvastatin (CRESTOR) 20 MG tablet Take 1 tablet (20 mg total) by mouth daily. 90 tablet 3   Current Facility-Administered Medications  Medication Dose Route Frequency Provider Last Rate Last Admin  . sodium chloride flush (NS) 0.9 % injection 3 mL  3 mL Intravenous Q12H Wellington Hampshire, MD        Allergies:   Lisinopril and Oxycodone    Social History:  The patient  reports that she quit smoking about 15 years ago. Her smoking use included cigarettes. She has a 31.00 pack-year smoking history. She has never used smokeless tobacco. She reports that she does not drink alcohol and does not use drugs.   Family History:  The patient's family history includes Cancer in her father; Coronary artery disease in an other family member; Heart disease (age of onset: 38) in her mother; Hyperlipidemia in her father and mother; Hypertension in her father and mother; Peripheral vascular disease in her mother.    ROS:  Please see the history of present illness.   Otherwise, review of systems are positive for none.   All other systems are reviewed and negative.    PHYSICAL EXAM: VS:  BP 128/62   Pulse 70   Ht 5\' 5"  (1.651 m)   Wt 199 lb 6.4 oz (90.4 kg)   SpO2 94%   BMI 33.18 kg/m  , BMI Body mass index is 33.18 kg/m. GEN: Well nourished, well developed, in no acute distress  HEENT: normal  Neck: no JVD or masses.  Right carotid bruit Cardiac: RRR; no rubs, or gallops,no edema .  2 /6 systolic murmur in the aortic area Respiratory:  clear to auscultation bilaterally, normal work of breathing GI: soft,  nontender, nondistended, + BS MS: no deformity or atrophy  Skin: warm and dry, no rash Neuro:  Strength and sensation are intact Psych: euthymic mood, full affect Vascular:  Distal pulses are palpable.   EKG:  EKG is not ordered today.   Recent Labs: 03/15/2020: ALT 16; BUN 11; Creatinine, Ser 0.77; Potassium 4.2; Sodium 138    Lipid Panel    Component Value Date/Time   CHOL 135 01/05/2020 0000   TRIG 247 (H) 01/05/2020 0000   HDL 42 01/05/2020 0000   CHOLHDL 3.2 01/05/2020 0000   CHOLHDL 3.8 03/04/2016 0959   VLDL 32 (H) 03/04/2016 0959   LDLCALC 54 01/05/2020 0000      Wt Readings from Last 3 Encounters:  07/17/20 199 lb 6.4 oz (90.4 kg)  03/15/20 195 lb (88.5 kg)  12/20/19 195 lb (88.5 kg)      No flowsheet data  found.    ASSESSMENT AND PLAN:   1.  Peripheral arterial disease: Status post  endovascular intervention on bilateral common iliac arteries into the external iliac arteries.  Palpable distal pulses and currently with no claudication.  Repeat Doppler studies in September.    2.  Carotid artery disease: Status post bilateral carotid endarterectomy and right subclavian to distal common carotid artery bypass graft and right vertebral transposition.  She does have a bruit on the right side.  Most recent carotid Doppler in 2020 showed mild nonobstructive disease.  I requested a repeat carotid Doppler.  3.  Hyperlipidemia: She was switched to rosuvastatin 20 mg daily during last visit.  Most recent lipid profile showed an LDL of 54.  4.  Essential hypertension: Blood pressure is well controlled.  5.  Left subclavian artery stenosis: She denies left arm claudication.  Thus, no indication for revascularization.  Blood pressure should always be checked in the right arm.  6.  Coronary artery disease: Currently with no anginal symptoms.  She has chronic left bundle branch block.    Disposition:   FU with me in 12 months  Signed,  Kathlyn Sacramento, MD   07/17/2020 11:21 AM    [place]

## 2020-08-02 ENCOUNTER — Ambulatory Visit (HOSPITAL_COMMUNITY)
Admission: RE | Admit: 2020-08-02 | Discharge: 2020-08-02 | Disposition: A | Discharge status: Home / Self Care | Payer: Medicare Other | Source: Ambulatory Visit | Attending: Cardiovascular Disease | Admitting: Cardiovascular Disease

## 2020-08-02 ENCOUNTER — Other Ambulatory Visit (HOSPITAL_COMMUNITY): Payer: Self-pay | Admitting: Cardiovascular Disease

## 2020-08-02 ENCOUNTER — Other Ambulatory Visit: Payer: Self-pay

## 2020-08-02 DIAGNOSIS — G458 Other transient cerebral ischemic attacks and related syndromes: Secondary | ICD-10-CM

## 2020-08-02 DIAGNOSIS — I6523 Occlusion and stenosis of bilateral carotid arteries: Secondary | ICD-10-CM

## 2020-08-27 ENCOUNTER — Other Ambulatory Visit: Payer: Self-pay | Admitting: Cardiovascular Disease

## 2020-11-30 ENCOUNTER — Other Ambulatory Visit: Payer: Self-pay | Admitting: Cardiovascular Disease

## 2020-11-30 DIAGNOSIS — I739 Peripheral vascular disease, unspecified: Secondary | ICD-10-CM

## 2020-12-03 NOTE — Telephone Encounter (Signed)
Please review for refill, Thanks !  

## 2021-02-08 ENCOUNTER — Telehealth: Payer: Self-pay | Admitting: Cardiovascular Disease

## 2021-02-08 DIAGNOSIS — I739 Peripheral vascular disease, unspecified: Secondary | ICD-10-CM

## 2021-02-08 NOTE — Telephone Encounter (Signed)
Orders placed.

## 2021-02-08 NOTE — Telephone Encounter (Signed)
Patient will need an updated order for an Aorta/IVC/Iliacs with ABI. Current order expires on 02/13/21 and there is no availability prior. Please assist.

## 2021-02-12 ENCOUNTER — Ambulatory Visit (HOSPITAL_COMMUNITY)
Admission: RE | Admit: 2021-02-12 | Discharge: 2021-02-12 | Disposition: A | Discharge status: Home / Self Care | Payer: Medicare Other | Source: Ambulatory Visit | Attending: Cardiovascular Disease | Admitting: Cardiovascular Disease

## 2021-02-12 ENCOUNTER — Other Ambulatory Visit: Payer: Self-pay

## 2021-02-12 DIAGNOSIS — Z95828 Presence of other vascular implants and grafts: Secondary | ICD-10-CM | POA: Diagnosis present

## 2021-02-12 DIAGNOSIS — Z9889 Other specified postprocedural states: Secondary | ICD-10-CM

## 2021-02-12 DIAGNOSIS — I701 Atherosclerosis of renal artery: Secondary | ICD-10-CM

## 2021-02-13 ENCOUNTER — Encounter (HOSPITAL_COMMUNITY): Payer: Medicare Other

## 2021-02-22 ENCOUNTER — Other Ambulatory Visit: Payer: Self-pay

## 2021-02-22 ENCOUNTER — Ambulatory Visit (HOSPITAL_BASED_OUTPATIENT_CLINIC_OR_DEPARTMENT_OTHER)
Admission: RE | Admit: 2021-02-22 | Discharge: 2021-02-22 | Disposition: A | Discharge status: Home / Self Care | Payer: Medicare Other | Source: Ambulatory Visit | Attending: Cardiovascular Disease | Admitting: Cardiovascular Disease

## 2021-02-22 ENCOUNTER — Ambulatory Visit (HOSPITAL_COMMUNITY)
Admission: RE | Admit: 2021-02-22 | Discharge: 2021-02-22 | Disposition: A | Discharge status: Home / Self Care | Payer: Medicare Other | Source: Ambulatory Visit | Attending: Internal Medicine | Admitting: Internal Medicine

## 2021-02-22 DIAGNOSIS — Z95820 Peripheral vascular angioplasty status with implants and grafts: Secondary | ICD-10-CM | POA: Diagnosis not present

## 2021-02-22 DIAGNOSIS — I739 Peripheral vascular disease, unspecified: Secondary | ICD-10-CM

## 2021-03-25 ENCOUNTER — Other Ambulatory Visit (HOSPITAL_BASED_OUTPATIENT_CLINIC_OR_DEPARTMENT_OTHER): Payer: Self-pay | Admitting: Cardiovascular Disease

## 2021-03-25 DIAGNOSIS — Z95828 Presence of other vascular implants and grafts: Secondary | ICD-10-CM

## 2021-03-25 DIAGNOSIS — Z9889 Other specified postprocedural states: Secondary | ICD-10-CM

## 2021-04-01 ENCOUNTER — Other Ambulatory Visit (HOSPITAL_COMMUNITY): Payer: Self-pay | Admitting: Cardiovascular Disease

## 2021-04-01 DIAGNOSIS — I701 Atherosclerosis of renal artery: Secondary | ICD-10-CM

## 2021-04-01 DIAGNOSIS — Z9889 Other specified postprocedural states: Secondary | ICD-10-CM

## 2021-08-02 ENCOUNTER — Ambulatory Visit (HOSPITAL_COMMUNITY)
Admission: RE | Admit: 2021-08-02 | Discharge: 2021-08-02 | Disposition: A | Discharge status: Home / Self Care | Payer: Medicare Other | Source: Ambulatory Visit | Attending: Cardiology | Admitting: Cardiology

## 2021-08-02 ENCOUNTER — Other Ambulatory Visit (HOSPITAL_COMMUNITY): Payer: Self-pay | Admitting: Cardiovascular Disease

## 2021-08-02 ENCOUNTER — Other Ambulatory Visit: Payer: Self-pay

## 2021-08-02 DIAGNOSIS — I6523 Occlusion and stenosis of bilateral carotid arteries: Secondary | ICD-10-CM

## 2021-08-02 DIAGNOSIS — Z9889 Other specified postprocedural states: Secondary | ICD-10-CM

## 2021-08-02 DIAGNOSIS — G458 Other transient cerebral ischemic attacks and related syndromes: Secondary | ICD-10-CM | POA: Diagnosis not present

## 2021-08-25 ENCOUNTER — Other Ambulatory Visit: Payer: Self-pay | Admitting: Cardiovascular Disease

## 2021-08-26 NOTE — Telephone Encounter (Signed)
Refill request

## 2021-10-08 ENCOUNTER — Ambulatory Visit: Payer: Medicare Other | Admitting: Cardiovascular Disease

## 2021-10-15 ENCOUNTER — Ambulatory Visit: Payer: Medicare Other | Admitting: Cardiovascular Disease

## 2021-10-22 ENCOUNTER — Encounter: Payer: Self-pay | Admitting: Cardiovascular Disease

## 2021-10-22 ENCOUNTER — Ambulatory Visit: Payer: Medicare Other | Admitting: Cardiovascular Disease

## 2021-10-22 VITALS — BP 126/78 | HR 67 | Ht 65.0 in | Wt 191.6 lb

## 2021-10-22 DIAGNOSIS — I10 Essential (primary) hypertension: Secondary | ICD-10-CM | POA: Diagnosis not present

## 2021-10-22 DIAGNOSIS — E785 Hyperlipidemia, unspecified: Secondary | ICD-10-CM

## 2021-10-22 DIAGNOSIS — I739 Peripheral vascular disease, unspecified: Secondary | ICD-10-CM

## 2021-10-22 DIAGNOSIS — I2510 Atherosclerotic heart disease of native coronary artery without angina pectoris: Secondary | ICD-10-CM

## 2021-10-22 DIAGNOSIS — I6523 Occlusion and stenosis of bilateral carotid arteries: Secondary | ICD-10-CM | POA: Diagnosis not present

## 2021-10-22 MED ORDER — BRILINTA 60 MG PO TABS: 60.0000 mg | ORAL_TABLET | Freq: Two times a day (BID) | ORAL | 3 refills | Status: DC

## 2021-10-22 NOTE — Progress Notes (Signed)
?  ?Cardiology Office Note ? ? ?Date:  10/22/2021  ? ?ID:  Brenda [name], DOB 02/26/1961, MRN [id number] ? ?PCP:  [facility]  ?Cardiologist:  Dr. Burt Knack ? ?No chief complaint on file. ? ? ?  ?History of Present Illness: ?Brenda [name] is a 61 y.o. female who is here today for follow-up regarding peripheral arterial disease. ?She has multiple medical problems including coronary artery disease, chronic systolic heart failure with subsequent improvement in ejection fraction, COPD, previous tobacco use, hypertension, hyperlipidemia and extensive peripheral arterial disease. ?She had previous right renal artery stenting in 7902 which was complicated by wire induced perforation that was treated with coils.  She had in-stent restenosis that was subsequently treated with covered stent placement. ?She also has known history of carotid disease status post bilateral carotid endarterectomy and right carotid to subclavian artery bypass. ?  ?She was seen in 2020 for worsening claudication due to iliac disease.  Angiography in December 2020  showed patent renal artery stent with mild in-stent restenosis, patent bilateral kissing common iliac artery stents extending into the distal aorta with mild in-stent restenosis with significant disease distal to the stents.  2 additional self-expanding stents were placed bilaterally.   ?She has been doing well with no chest pain, shortness of breath or palpitations.  She has minimal leg claudication and does not feel limited. ? ?Past Medical History:  ?Diagnosis Date  ? Anemia   ? Arthritis   ? CAD (coronary artery disease)   ? Cardiomyopathy   ? Carotid artery occlusion   ? CHF (congestive heart failure) ([place]) 2007  ? COPD (chronic obstructive pulmonary disease) ([place])   ? not on home o2  ? HTN (hypertension)   ? Hypercholesteremia   ? Pulmonary nodule   ? PVD (peripheral vascular disease) ([place])   ? Renal artery stenosis ([place])   ? ? ?Past Surgical History:   ?Procedure Laterality Date  ? ABDOMINAL AORTOGRAM W/LOWER EXTREMITY N/A 05/11/2019  ? Procedure: ABDOMINAL AORTOGRAM W/LOWER EXTREMITY;  Surgeon: Wellington Hampshire, MD;  Location: [place] [facility];  Service: Cardiovascular;  Laterality: N/A;  ? CARDIAC CATHETERIZATION    ? 2007  ? CHOLECYSTECTOMY N/A 03/01/2019  ? Procedure: LAPAROSCOPIC CHOLECYSTECTOMY;  Surgeon: Ralene Ok, MD;  Location: [place];  Service: General;  Laterality: N/A;  ? CORONARY STENT PLACEMENT    ?  Successful stenting of the right renal ostium   complicated by distal wire perforation.  The wire perforation was  treated with embolization of the branch vessels involved.  Will check CT   scan to assess the right renal parenchymal hematoma.  She will be   maintained on both aspirin and Plavix.  Will place a Foley catheter to   avoid clotting since hematuria is likely.    ? EXTERNAL EAR SURGERY    ? as a child  ? PERIPHERAL VASCULAR INTERVENTION  05/11/2019  ? Procedure: PERIPHERAL VASCULAR INTERVENTION;  Surgeon: Wellington Hampshire, MD;  Location: [place] [facility];  Service: Cardiovascular;;  Bilater Iliac  ? ? ? ?Current Outpatient Medications  ?Medication Sig Dispense Refill  ? Acetaminophen 500 MG capsule Take 1 capsule by mouth as needed for fever.    ? albuterol (PROVENTIL HFA;VENTOLIN HFA) 108 (90 Base) MCG/ACT inhaler Inhale 1-2 puffs into the lungs every 6 (six) hours as needed for wheezing or shortness of breath. 1 Inhaler 1  ? amLODipine (NORVASC) 10 MG tablet Take 1 tablet (10 mg total) by mouth  daily. 90 tablet 2  ? candesartan (ATACAND) 16 MG tablet Take 1 tablet (16 mg total) by mouth daily. Please make overdue appt with Dr. Burt Knack before anymore refills. 1st attempt 30 tablet 0  ? carvedilol (COREG) 6.25 MG tablet Take 1 tablet (6.25 mg total) by mouth 2 (two) times daily with a meal. Please schedule appt for future refills. 1st attempt 180 tablet 0  ? fluticasone (FLONASE) 50 MCG/ACT nasal spray Place 1 spray into both nostrils  at bedtime.    ? guaiFENesin-dextromethorphan (ROBITUSSIN DM) 100-10 MG/5ML syrup Take 10 mLs by mouth every 4 (four) hours as needed for cough. 118 mL 0  ? omeprazole (PRILOSEC) 40 MG capsule Take 1 capsule (40 mg total) by mouth daily. Take daily before breakfast or lunch 30 capsule 3  ? rosuvastatin (CRESTOR) 20 MG tablet Take 1 tablet by mouth once daily 90 tablet 3  ? BRILINTA 60 MG TABS tablet Take 1 tablet (60 mg total) by mouth 2 (two) times daily. 180 tablet 3  ? ?Current Facility-Administered Medications  ?Medication Dose Route Frequency Provider Last Rate Last Admin  ? sodium chloride flush (NS) 0.9 % injection 3 mL  3 mL Intravenous Q12H Wellington Hampshire, MD      ? ? ?Allergies:   Lisinopril and Oxycodone  ? ? ?Social History:  The patient  reports that she quit smoking about 16 years ago. Her smoking use included cigarettes. She has a 31.00 pack-year smoking history. She has never used smokeless tobacco. She reports that she does not drink alcohol and does not use drugs.  ? ?Family History:  The patient's family history includes Cancer in her father; Coronary artery disease in an other family member; Heart disease (age of onset: 39) in her mother; Hyperlipidemia in her father and mother; Hypertension in her father and mother; Peripheral vascular disease in her mother.  ? ? ?ROS:  Please see the history of present illness.   Otherwise, review of systems are positive for none.   All other systems are reviewed and negative.  ? ? ?PHYSICAL EXAM: ?VS:  BP 126/78   Pulse 67   Ht '5\' 5"'$  (1.651 m)   Wt 191 lb 9.6 oz (86.9 kg)   SpO2 95%   BMI 31.88 kg/m?  , BMI Body mass index is 31.88 kg/m?. ?GEN: Well nourished, well developed, in no acute distress  ?HEENT: normal  ?Neck: no JVD or masses.  Right carotid bruit ?Cardiac: RRR; no rubs, or gallops,no edema .  2 /6 systolic murmur in the aortic area ?Respiratory:  clear to auscultation bilaterally, normal work of breathing ?GI: soft, nontender, nondistended,  + BS ?MS: no deformity or atrophy  ?Skin: warm and dry, no rash ?Neuro:  Strength and sensation are intact ?Psych: euthymic mood, full affect ?Vascular:  Distal pulses are palpable. ? ? ?EKG:  EKG is  ordered today. ?EKG showed normal sinus rhythm with left bundle branch block. ? ?Recent Labs: ?No results found for requested labs within last 8760 hours.  ? ? ?Lipid Panel ?   ?Component Value Date/Time  ? CHOL 135 01/05/2020 0000  ? TRIG 247 (H) 01/05/2020 0000  ? HDL 42 01/05/2020 0000  ? CHOLHDL 3.2 01/05/2020 0000  ? CHOLHDL 3.8 03/04/2016 0959  ? VLDL 32 (H) 03/04/2016 0959  ? [place] 54 01/05/2020 0000  ? ?  ? ?Wt Readings from Last 3 Encounters:  ?10/22/21 191 lb 9.6 oz (86.9 kg)  ?07/17/20 199 lb 6.4 oz (90.4 kg)  ?  03/15/20 195 lb (88.5 kg)  ?  ? ? ?   ? View : No data to display.  ?  ?  ?  ? ? ? ? ?ASSESSMENT AND PLAN: ? ? ?1.  Peripheral arterial disease: Status post  endovascular intervention on bilateral common iliac arteries into the external iliac arteries.  Palpable distal pulses and currently with no claudication.  I requested repeat Doppler studies to be done in September. ?  ?2.  Carotid artery disease: Status post bilateral carotid endarterectomy and right subclavian to distal common carotid artery bypass graft and right vertebral transposition.  The bypass was patent in February with stable findings.   ?  ?3.  Hyperlipidemia: I reviewed most recent lipid profile done with her primary care physician in January which showed an LDL of 51 and triglyceride of 153.  Continue treatment with rosuvastatin 20 mg daily. ? ?4.  Essential hypertension: Blood pressure is well controlled. ? ?5.  Left subclavian artery stenosis: She denies left arm claudication.  Thus, no indication for revascularization.  Left radial pulse is close to normal. ? ?6.  Coronary artery disease: Currently with no anginal symptoms.  She has chronic left bundle branch block. ?She is on long-term treatment with ticagrelor 60 mg twice  daily which is optional at this time but given her extensive cardiovascular disease, I elected to continue. ? ? ? ?Disposition:   FU with me in 12 months ? ?Signed, ? ?Kathlyn Sacramento, MD  ?10/22/2021 9:58 AM

## 2021-10-22 NOTE — Patient Instructions (Signed)
Medication Instructions:  ?No changes ?*If you need a refill on your cardiac medications before your next appointment, please call your pharmacy* ? ? ?Lab Work: ?None ordered ?If you have labs (blood work) drawn today and your tests are completely normal, you will receive your results only by: ?MyChart Message (if you have MyChart) OR ?A paper copy in the mail ?If you have any lab test that is abnormal or we need to change your treatment, we will call you to review the results. ? ? ?Testing/Procedures: ?Your physician has requested that you have an Aorta/Iliac Duplex in September. This will be take place at [place], [address]. ? ? ?No food after 11PM the night before.  Water is OK. (Don't drink liquids if you have been instructed not to for ANOTHER test) ?Avoid foods that produce bowel gas, for 24 hours prior to exam (see below). ?No breakfast, no chewing gum, no smoking or carbonated beverages. ?Patient may take morning medications with water. ?Come in for test at least 15 minutes early to register. ? ?Your physician has requested that you have an ankle brachial index (ABI) I September. During this test an ultrasound and blood pressure cuff are used to evaluate the arteries that supply the arms and legs with blood. Allow thirty minutes for this exam. There are no restrictions or special instructions. This will take place at [place], [address].  ? ? ? ? ?Follow-Up: ?At [facility], you and your health needs are our priority.  As part of our continuing mission to provide you with exceptional heart care, we have created designated Provider Care Teams.  These Care Teams include your primary Cardiologist (physician) and Advanced Practice Providers (APPs -  Physician Assistants and Nurse Practitioners) who all work together to provide you with the care you need, when you need it. ? ?We recommend signing up for the patient portal called "MyChart".  Sign up information is provided on this After  Visit Summary.  MyChart is used to connect with patients for Virtual Visits (Telemedicine).  Patients are able to view lab/test results, encounter notes, upcoming appointments, etc.  Non-urgent messages can be sent to your provider as well.   ?To learn more about what you can do with MyChart, go to [email or web address].   ? ?Your next appointment:   ?12 month(s) ? ?The format for your next appointment:   ?In Person ? ?Provider:   ?Dr. Fletcher Anon ?

## 2021-11-17 ENCOUNTER — Other Ambulatory Visit: Payer: Self-pay | Admitting: Cardiovascular Disease

## 2021-11-28 ENCOUNTER — Other Ambulatory Visit: Payer: Self-pay

## 2021-11-28 DIAGNOSIS — I739 Peripheral vascular disease, unspecified: Secondary | ICD-10-CM

## 2021-11-28 MED ORDER — ROSUVASTATIN CALCIUM 20 MG PO TABS: 20.0000 mg | ORAL_TABLET | Freq: Every day | ORAL | 3 refills | Status: DC

## 2021-12-23 ENCOUNTER — Other Ambulatory Visit: Payer: Self-pay | Admitting: Cardiovascular Disease

## 2022-02-17 ENCOUNTER — Ambulatory Visit (HOSPITAL_BASED_OUTPATIENT_CLINIC_OR_DEPARTMENT_OTHER)
Admission: RE | Admit: 2022-02-17 | Discharge: 2022-02-17 | Disposition: A | Discharge status: Home / Self Care | Payer: Medicare Other | Source: Ambulatory Visit | Attending: Cardiovascular Disease | Admitting: Cardiovascular Disease

## 2022-02-17 ENCOUNTER — Ambulatory Visit (HOSPITAL_COMMUNITY)
Admission: RE | Admit: 2022-02-17 | Discharge: 2022-02-17 | Disposition: A | Discharge status: Home / Self Care | Payer: Medicare Other | Source: Ambulatory Visit | Attending: Internal Medicine | Admitting: Internal Medicine

## 2022-02-17 ENCOUNTER — Encounter (HOSPITAL_COMMUNITY): Payer: Medicare Other

## 2022-02-17 DIAGNOSIS — Z95828 Presence of other vascular implants and grafts: Secondary | ICD-10-CM

## 2022-02-17 DIAGNOSIS — I739 Peripheral vascular disease, unspecified: Secondary | ICD-10-CM | POA: Insufficient documentation

## 2022-02-18 ENCOUNTER — Encounter (HOSPITAL_COMMUNITY): Payer: Medicare Other

## 2022-02-20 ENCOUNTER — Other Ambulatory Visit: Payer: Self-pay | Admitting: *Deleted

## 2022-02-20 DIAGNOSIS — I739 Peripheral vascular disease, unspecified: Secondary | ICD-10-CM

## 2022-07-28 ENCOUNTER — Ambulatory Visit (HOSPITAL_COMMUNITY)
Admission: RE | Admit: 2022-07-28 | Discharge: 2022-07-28 | Disposition: A | Discharge status: Home / Self Care | Payer: Medicare Other | Source: Ambulatory Visit | Attending: Cardiovascular Disease | Admitting: Cardiovascular Disease

## 2022-07-28 DIAGNOSIS — I6523 Occlusion and stenosis of bilateral carotid arteries: Secondary | ICD-10-CM | POA: Insufficient documentation

## 2022-07-28 DIAGNOSIS — Z9889 Other specified postprocedural states: Secondary | ICD-10-CM | POA: Diagnosis not present

## 2022-07-31 ENCOUNTER — Other Ambulatory Visit: Payer: Self-pay | Admitting: *Deleted

## 2022-07-31 DIAGNOSIS — I6523 Occlusion and stenosis of bilateral carotid arteries: Secondary | ICD-10-CM

## 2022-09-16 ENCOUNTER — Other Ambulatory Visit: Payer: Self-pay | Admitting: Cardiovascular Disease

## 2022-10-03 ENCOUNTER — Other Ambulatory Visit: Payer: Self-pay | Admitting: Cardiovascular Disease

## 2022-10-03 NOTE — Telephone Encounter (Signed)
Refill Request.  

## 2022-10-16 ENCOUNTER — Other Ambulatory Visit: Payer: Self-pay | Admitting: Cardiovascular Disease

## 2022-11-13 ENCOUNTER — Other Ambulatory Visit: Payer: Self-pay | Admitting: Cardiovascular Disease

## 2022-11-13 DIAGNOSIS — I739 Peripheral vascular disease, unspecified: Secondary | ICD-10-CM

## 2022-11-13 NOTE — Telephone Encounter (Signed)
Refill request

## 2022-11-16 ENCOUNTER — Other Ambulatory Visit: Payer: Self-pay | Admitting: Cardiovascular Disease

## 2022-12-31 ENCOUNTER — Other Ambulatory Visit: Payer: Self-pay | Admitting: Cardiovascular Disease

## 2022-12-31 NOTE — Telephone Encounter (Signed)
Refill request

## 2023-01-26 ENCOUNTER — Other Ambulatory Visit: Payer: Self-pay

## 2023-01-26 MED ORDER — BRILINTA 60 MG PO TABS: 60.0000 mg | ORAL_TABLET | Freq: Two times a day (BID) | ORAL | 0 refills | Status: DC

## 2023-02-13 ENCOUNTER — Other Ambulatory Visit: Payer: Self-pay | Admitting: Internal Medicine

## 2023-02-13 DIAGNOSIS — I739 Peripheral vascular disease, unspecified: Secondary | ICD-10-CM

## 2023-02-18 ENCOUNTER — Ambulatory Visit (HOSPITAL_BASED_OUTPATIENT_CLINIC_OR_DEPARTMENT_OTHER)
Admission: RE | Admit: 2023-02-18 | Discharge: 2023-02-18 | Disposition: A | Discharge status: Home / Self Care | Payer: Medicare Other | Source: Ambulatory Visit | Attending: Cardiovascular Disease | Admitting: Cardiovascular Disease

## 2023-02-18 ENCOUNTER — Ambulatory Visit (HOSPITAL_COMMUNITY)
Admission: RE | Admit: 2023-02-18 | Discharge: 2023-02-18 | Disposition: A | Discharge status: Home / Self Care | Payer: Medicare Other | Source: Ambulatory Visit | Attending: Internal Medicine | Admitting: Internal Medicine

## 2023-02-18 DIAGNOSIS — I739 Peripheral vascular disease, unspecified: Secondary | ICD-10-CM

## 2023-02-18 DIAGNOSIS — Z95828 Presence of other vascular implants and grafts: Secondary | ICD-10-CM

## 2023-02-22 LAB — VAS US ABI WITH/WO TBI
Left ABI: 0.94
Right ABI: 0.74

## 2023-02-26 ENCOUNTER — Other Ambulatory Visit (HOSPITAL_COMMUNITY): Payer: Self-pay | Admitting: *Deleted

## 2023-02-26 DIAGNOSIS — I739 Peripheral vascular disease, unspecified: Secondary | ICD-10-CM

## 2023-03-05 ENCOUNTER — Other Ambulatory Visit: Payer: Self-pay

## 2023-03-05 DIAGNOSIS — I739 Peripheral vascular disease, unspecified: Secondary | ICD-10-CM

## 2023-03-05 MED ORDER — ROSUVASTATIN CALCIUM 20 MG PO TABS: 20.0000 mg | ORAL_TABLET | Freq: Every day | ORAL | 0 refills | Status: DC

## 2023-03-09 ENCOUNTER — Ambulatory Visit: Payer: Medicare Other | Admitting: Physician Assistant

## 2023-03-10 ENCOUNTER — Other Ambulatory Visit: Payer: Self-pay | Admitting: Cardiovascular Disease

## 2023-03-10 NOTE — Telephone Encounter (Signed)
Refill request

## 2023-03-11 ENCOUNTER — Other Ambulatory Visit (HOSPITAL_BASED_OUTPATIENT_CLINIC_OR_DEPARTMENT_OTHER): Payer: Self-pay

## 2023-03-11 MED ORDER — BRILINTA 60 MG PO TABS: 60.0000 mg | ORAL_TABLET | Freq: Two times a day (BID) | ORAL | 0 refills | Status: DC

## 2023-03-18 ENCOUNTER — Encounter (HOSPITAL_COMMUNITY): Payer: Self-pay

## 2023-03-18 ENCOUNTER — Other Ambulatory Visit: Payer: Self-pay

## 2023-03-18 ENCOUNTER — Encounter (HOSPITAL_COMMUNITY): Payer: Self-pay | Admitting: Orthopedic Surgery

## 2023-03-18 NOTE — Anesthesia Preprocedure Evaluation (Signed)
Anesthesia Evaluation  Patient identified by MRN, date of birth, ID band Patient awake    Reviewed: Allergy & Precautions, NPO status , Patient's Chart, lab work & pertinent test results  Airway Mallampati: II  TM Distance: >3 FB Neck ROM: Full    Dental no notable dental hx. (+) Upper Dentures, Dental Advisory Given   Pulmonary pneumonia, resolved, COPD,  COPD inhaler, former smoker   Pulmonary exam normal breath sounds clear to auscultation       Cardiovascular Exercise Tolerance: Good hypertension, Pt. on medications + CAD, + Cardiac Stents, + Peripheral Vascular Disease and +CHF  Normal cardiovascular exam Rhythm:Regular Rate:Normal  Hx/o Renal artery stenosis Stent x 2 Carotid artery stenosis S/p bilateral carotid endarterectomy and right carotid to subclavian artery bypass Chronic anticoagulation   Echo 10/31/14 Left ventricle: The cavity size was normal. There was moderate    concentric hypertrophy. Systolic function was vigorous. The    estimated ejection fraction was in the range of 65% to 70%. There    was no dynamic obstruction. Wall motion was normal; there were no    regional wall motion abnormalities. Doppler parameters are    consistent with abnormal left ventricular relaxation (grade 1    diastolic dysfunction).   EKG 03/19/23 NSR, LBBB pattern     Neuro/Psych negative neurological ROS  negative psych ROS   GI/Hepatic Neg liver ROS,,,  Endo/Other  negative endocrine ROS  Hyperlipidemia  Renal/GU negative Renal ROS  negative genitourinary   Musculoskeletal  (+) Arthritis , Osteoarthritis,  Right proximal humerus Fx   Abdominal  (+) + obese  Peds  Hematology  (+) Blood dyscrasia, anemia Brilinta therapy- last dose 10/9   Anesthesia Other Findings   Reproductive/Obstetrics                             Anesthesia Physical Anesthesia Plan  ASA: 3  Anesthesia Plan:  General   Post-op Pain Management: Minimal or no pain anticipated, Regional block*, Precedex and Tylenol PO (pre-op)*   Induction: Intravenous  PONV Risk Score and Plan: 4 or greater and Treatment may vary due to age or medical condition, Ondansetron, Dexamethasone and Midazolam  Airway Management Planned: Oral ETT  Additional Equipment: None  Intra-op Plan:   Post-operative Plan: Extubation in OR  Informed Consent: I have reviewed the patients History and Physical, chart, labs and discussed the procedure including the risks, benefits and alternatives for the proposed anesthesia with the patient or authorized representative who has indicated his/her understanding and acceptance.     Dental advisory given  Plan Discussed with: Anesthesiologist and CRNA  Anesthesia Plan Comments: (See PAT note 03/18/2023, Christeen Douglas, PA-C)        Anesthesia Quick Evaluation

## 2023-03-18 NOTE — Progress Notes (Signed)
Anesthesia Chart Review   Case: [id number] Date/Time: 03/19/23 1215   Procedure: OPEN REDUCTION INTERNAL FIXATION (ORIF) PROXIMAL HUMERUS FRACTURE (Right) -   Anesthesia type: General   Pre-op diagnosis: Right displaced proximal humerus fracture   Location: [facility] ROOM 06 / [facility]   Surgeons: Francena Hanly, MD       DISCUSSION:62 y.o. former smoker with h/o HTN, PVD, COPD, CHF, CAD, right displaced proximal humerus fracture scheduled for above procedure 03/19/2023 with Dr. Francena Hanly.   H/o right renal artery stenting in 2008 which was complicated by wire induced perforation that was treated with coils.  She had in-stent restenosis that was subsequently treated with covered stent placement. S/p bilateral carotid endarterectomy and right carotid to subclavian artery bypass. Angiography in December 2020 showed patent renal artery stent with mild in-stent restenosis, patent bilateral kissing common iliac artery stents extending into the distal aorta with mild in-stent restenosis with significant disease distal to the stents. 2 additional self-expanding stents were placed bilaterally.   Pt last seen by cardiology 10/22/2021. At this visit palpable distal pulses with no claudication. Blood pressure well controlled. Left subclavian artery stenosis without claudication, normal radial pulses, no indication for revascularization. No angina sx at this visit. Pt advised at this visit to follow up with cardio in one year, she has not been seen since that visit.   Pt last seen by PCP 01/29/2023. Stable at this visit, no murmur noted. When she was seen by PCP 07/11/22 an Echo was recommended due heart murmur. Per notes pt requested cardiology order this test.   Pt on Brilinta, last dose 03/18/2023 AM.  Per Dr. Dub Mikes will proceed as this is emergent case.  Discussed with Dr. Charlynn Grimes.  VS: Ht 5\' 5"  (1.651 m)   Wt 89.8 kg   BMI 32.95 kg/m   PROVIDERS: [facility]   LABS:  Labs reviewed: Acceptable for surgery. (all labs ordered are listed, but only abnormal results are displayed)  Labs Reviewed - No data to display   IMAGES:   EKG:   CV: Echo 10/31/2014 Study Conclusions   - Left ventricle: The cavity size was normal. There was moderate    concentric hypertrophy. Systolic function was vigorous. The    estimated ejection fraction was in the range of 65% to 70%. There    was no dynamic obstruction. Wall motion was normal; there were no    regional wall motion abnormalities. Doppler parameters are    consistent with abnormal left ventricular relaxation (grade 1    diastolic dysfunction).   Past Medical History:  Diagnosis Date   Anemia    Arthritis    CAD (coronary artery disease)    Cardiomyopathy    Carotid artery occlusion    CHF (congestive heart failure) (HCC) 2007   COPD (chronic obstructive pulmonary disease) (HCC)    not on home o2   History of kidney stones    HTN (hypertension)    Hypercholesteremia    Pre-diabetes    Pulmonary nodule    PVD (peripheral vascular disease) (HCC)    Renal artery stenosis (HCC)     Past Surgical History:  Procedure Laterality Date   ABDOMINAL AORTOGRAM W/LOWER EXTREMITY N/A 05/11/2019   Procedure: ABDOMINAL AORTOGRAM W/LOWER EXTREMITY;  Surgeon: Iran Ouch, MD;  Location: [facility];  Service: Cardiovascular;  Laterality: N/A;   CARDIAC CATHETERIZATION     2007   CHOLECYSTECTOMY N/A 03/01/2019   Procedure: LAPAROSCOPIC CHOLECYSTECTOMY;  Surgeon:  Axel Filler, MD;  Location: [facility] OR;  Service: General;  Laterality: N/A;   CORONARY STENT PLACEMENT      Successful stenting of the right renal ostium   complicated by distal wire perforation.  The wire perforation was  treated with embolization of the branch vessels involved.  Will check CT   scan to assess the right renal parenchymal hematoma.  She will be   maintained on both aspirin and Plavix.  Will place a Foley catheter to   avoid  clotting since hematuria is likely.     EXTERNAL EAR SURGERY     as a child   PERIPHERAL VASCULAR INTERVENTION  05/11/2019   Procedure: PERIPHERAL VASCULAR INTERVENTION;  Surgeon: Iran Ouch, MD;  Location: [facility];  Service: Cardiovascular;;  Bilater Iliac    MEDICATIONS:  sodium chloride flush (NS) 0.9 % injection 3 mL    Acetaminophen 500 MG capsule   albuterol (PROVENTIL HFA;VENTOLIN HFA) 108 (90 Base) MCG/ACT inhaler   amLODipine (NORVASC) 10 MG tablet   BRILINTA 60 MG TABS tablet   candesartan (ATACAND) 16 MG tablet   carvedilol (COREG) 6.25 MG tablet   fluticasone (FLONASE) 50 MCG/ACT nasal spray   guaiFENesin-dextromethorphan (ROBITUSSIN DM) 100-10 MG/5ML syrup   omeprazole (PRILOSEC) 40 MG capsule   rosuvastatin (CRESTOR) 20 MG tablet    [facility], PA-C [facility] Pre-Surgical Testing [phone]

## 2023-03-18 NOTE — Progress Notes (Signed)
   03/18/23 1442  OBSTRUCTIVE SLEEP APNEA  Have you ever been diagnosed with sleep apnea through a sleep study? No  Do you snore loudly (loud enough to be heard through closed doors)?  1  Do you often feel tired, fatigued, or sleepy during the daytime (such as falling asleep during driving or talking to someone)? 0  Has anyone observed you stop breathing during your sleep? 1  Do you have, or are you being treated for high blood pressure? 1  BMI more than 35 kg/m2? 0  Age > 50 (1-yes) 1  Neck circumference greater than:Female 16 inches or larger, Female 17inches or larger? 1  Female Gender (Yes=1) 0  Obstructive Sleep Apnea Score 5  Score 5 or greater  Results sent to PCP

## 2023-03-18 NOTE — Patient Instructions (Signed)
SURGICAL WAITING ROOM VISITATION Patients having surgery or a procedure may have no more than 2 support people in the waiting area - these visitors may rotate.    Children under the age of 56 must have an adult with them who is not the patient.  If the patient needs to stay at the hospital during part of their recovery, the visitor guidelines for inpatient rooms apply. Pre-op nurse will coordinate an appropriate time for 1 support person to accompany patient in pre-op.  This support person may not rotate.    Please refer to the [facility] website for the visitor guidelines for Inpatients (after your surgery is over and you are in a regular room).       Your procedure is scheduled on: 03-19-23   Report to [facility] Main Entrance    Report to admitting at 10:15 AM   Call this number if you have problems the morning of surgery [phone]   Do not eat food :After Midnight.   After Midnight you may have the following liquids until 9:30 AM DAY OF SURGERY  Water Non-Citrus Juices (without pulp, NO RED-Apple, White grape, White cranberry) Black Coffee (NO MILK/CREAM OR CREAMERS, sugar ok)  Clear Tea (NO MILK/CREAM OR CREAMERS, sugar ok) regular and decaf                             Plain Jell-O (NO RED)                                           Fruit ices (not with fruit pulp, NO RED)                                     Popsicles (NO RED)                                                               Sports drinks like Gatorade (NO RED)                       If you have questions, please contact your surgeon's office.   FOLLOW ANY ADDITIONAL PRE OP INSTRUCTIONS YOU RECEIVED FROM YOUR SURGEON'S OFFICE!!!     Oral Hygiene is also important to reduce your risk of infection.                                    Remember - BRUSH YOUR TEETH THE MORNING OF SURGERY WITH YOUR REGULAR TOOTHPASTE   Do NOT smoke after Midnight   Take these medicines the morning of surgery with A  SIP OF WATER:   Amlodipine  Carvedilol  Zyrtec  Omeprazole  Tylenol if needed  Okay to use eyedrops, inhalers and nasal spray  Stop all vitamins and herbal supplements 7 days before surgery  You may not have any metal on your body including hair pins, jewelry, and body piercing             Do not wear make-up, lotions, powders, perfumes or deodorant  Do not wear nail polish including gel and S&S, artificial/acrylic nails, or any other type of covering on natural nails including finger and toenails. If you have artificial nails, gel coating, etc. that needs to be removed by a nail salon please have this removed prior to surgery or surgery may need to be canceled/ delayed if the surgeon/ anesthesia feels like they are unable to be safely monitored.   Do not shave  48 hours prior to surgery.    Do not bring valuables to the hospital. [facility] IS NOT RESPONSIBLE   FOR VALUABLES.   Contacts, dentures or bridgework may not be worn into surgery.  DO NOT BRING YOUR HOME MEDICATIONS TO THE HOSPITAL. PHARMACY WILL DISPENSE MEDICATIONS LISTED ON YOUR MEDICATION LIST TO YOU DURING YOUR ADMISSION IN THE HOSPITAL!    Patients discharged on the day of surgery will not be allowed to drive home.  Someone NEEDS to stay with you for the first 24 hours after anesthesia.   Please read over the following fact sheets you were given: IF YOU HAVE QUESTIONS ABOUT YOUR PRE-OP INSTRUCTIONS PLEASE CALL [phone]  If you received a COVID test during your pre-op visit  it is requested that you wear a mask when out in public, stay away from anyone that may not be feeling well and notify your surgeon if you develop symptoms. If you test positive for Covid or have been in contact with anyone that has tested positive in the last 10 days please notify you surgeon.  [facility] - Preparing for Surgery (use antibacterial soap if possible) Before surgery, you can play an important role.   Because skin is not sterile, your skin needs to be as free of germs as possible.  You can reduce the number of germs on your skin by washing with CHG (chlorahexidine gluconate) soap before surgery.  CHG is an antiseptic cleaner which kills germs and bonds with the skin to continue killing germs even after washing. Please DO NOT use if you have an allergy to CHG or antibacterial soaps.  If your skin becomes reddened/irritated stop using the CHG and inform your nurse when you arrive at Short Stay. Do not shave (including legs and underarms) for at least 48 hours prior to the first CHG shower.  You may shave your face/neck.  Please follow these instructions carefully:  1.  Shower with CHG Soap the night before surgery and the  morning of surgery.  2.  If you choose to wash your hair, wash your hair first as usual with your normal  shampoo.  3.  After you shampoo, rinse your hair and body thoroughly to remove the shampoo.                             4.  Use CHG as you would any other liquid soap.  You can apply chg directly to the skin and wash.  Gently with a scrungie or clean washcloth.  5.  Apply the CHG Soap to your body ONLY FROM THE NECK DOWN.   Do   not use on face/ open  Wound or open sores. Avoid contact with eyes, ears mouth and   genitals (private parts).                       Wash face,  Genitals (private parts) with your normal soap.             6.  Wash thoroughly, paying special attention to the area where your    surgery  will be performed.  7.  Thoroughly rinse your body with warm water from the neck down.  8.  DO NOT shower/wash with your normal soap after using and rinsing off the CHG Soap.                9.  Pat yourself dry with a clean towel.            10.  Wear clean pajamas.            11.  Place clean sheets on your bed the night of your first shower and do not  sleep with pets. Day of Surgery : Do not apply any lotions/deodorants the morning of  surgery.  Please wear clean clothes to the hospital/surgery center.  FAILURE TO FOLLOW THESE INSTRUCTIONS MAY RESULT IN THE CANCELLATION OF YOUR SURGERY  PATIENT SIGNATURE_________________________________  NURSE SIGNATURE__________________________________  ________________________________________________________________________

## 2023-03-18 NOTE — Progress Notes (Signed)
Surgery orders requested via Epic inbox. °

## 2023-03-18 NOTE — Progress Notes (Addendum)
COVID Vaccine Completed:  Date of COVID positive in last 90 days:  No  PCP - Abran Duke, MD Cardiologist - Lorine Bears, MD  Chest x-ray - N/A EKG - Day of surgery  Stress Test - N/A ECHO - 10-31-14 Epic Cardiac Cath - Yes Pacemaker/ICD device last checked: Spinal Cord Stimulator:  N/A US Aorta - 02-18-23 Epic  Bowel Prep - N/A  Sleep Study - Yes, neg sleep apnea CPAP - No  Prediabetes Fasting Blood Sugar -  Checks Blood Sugar - Patient states that she got machine last week and has only checked blood sugar once.  She cannot remember what the number was  Last dose of GLP1 agonist-  N/A GLP1 instructions:  N/A   Last dose of SGLT-2 inhibitors-  N/A SGLT-2 instructions: N/A   Blood Thinner Instructions:  Brilinta 60 mg.  Last dose 03-18-23 at 8:30 am.  States that they were not given any instructions for this  Aspirin Instructions: Last Dose:  Activity level:  Can go up a flight of stairs and perform activities of daily living without stopping and without symptoms of chest pain or shortness of breath.  Anesthesia review:  CAD, CHF, cardiomyopathy, HTN, COPD, PVD, LBBB.  Patient states that she gets shortness of breath if she has to walk long distances but can climb a flight of stairs without SOB.  Stop Bang 5  Patient denies shortness of breath, fever, cough and chest pain at PAT appointment (completed over the phone)  Patient verbalized understanding of instructions that were given to them at the PAT appointment. Patient was also instructed that they will need to review over the PAT instructions again at home before surgery.

## 2023-03-18 NOTE — Progress Notes (Signed)
Attempted to obtain medical history via telephone, unable to reach at this time. HIPAA compliant voicemail message left requesting return call to pre surgical testing department. 

## 2023-03-19 ENCOUNTER — Emergency Department (HOSPITAL_COMMUNITY): Payer: Medicare Other

## 2023-03-19 ENCOUNTER — Encounter (HOSPITAL_COMMUNITY): Payer: Self-pay

## 2023-03-19 ENCOUNTER — Encounter (HOSPITAL_COMMUNITY): Payer: Self-pay | Admitting: Orthopedic Surgery

## 2023-03-19 ENCOUNTER — Other Ambulatory Visit: Payer: Self-pay

## 2023-03-19 ENCOUNTER — Inpatient Hospital Stay (HOSPITAL_COMMUNITY)
Admission: EM | Admit: 2023-03-19 | Discharge: 2023-03-21 | DRG: 982 | Disposition: A | Discharge status: Home / Self Care | Payer: Medicare Other | Attending: Internal Medicine | Admitting: Internal Medicine

## 2023-03-19 ENCOUNTER — Ambulatory Visit (HOSPITAL_COMMUNITY)
Admission: RE | Admit: 2023-03-19 | Discharge: 2023-03-19 | Disposition: A | Discharge status: Home / Self Care | Payer: Medicare Other | Source: Ambulatory Visit | Attending: Orthopedic Surgery | Admitting: Orthopedic Surgery

## 2023-03-19 ENCOUNTER — Ambulatory Visit (HOSPITAL_COMMUNITY): Payer: Medicare Other | Admitting: Physician Assistant

## 2023-03-19 ENCOUNTER — Encounter (HOSPITAL_COMMUNITY)
Admission: RE | Disposition: A | Discharge status: Home / Self Care | Payer: Self-pay | Source: Ambulatory Visit | Attending: Orthopedic Surgery

## 2023-03-19 ENCOUNTER — Ambulatory Visit (HOSPITAL_COMMUNITY): Payer: Medicare Other

## 2023-03-19 DIAGNOSIS — W1830XA Fall on same level, unspecified, initial encounter: Secondary | ICD-10-CM | POA: Insufficient documentation

## 2023-03-19 DIAGNOSIS — S42201A Unspecified fracture of upper end of right humerus, initial encounter for closed fracture: Secondary | ICD-10-CM | POA: Insufficient documentation

## 2023-03-19 DIAGNOSIS — Y929 Unspecified place or not applicable: Secondary | ICD-10-CM

## 2023-03-19 DIAGNOSIS — Z87891 Personal history of nicotine dependence: Secondary | ICD-10-CM | POA: Insufficient documentation

## 2023-03-19 DIAGNOSIS — I739 Peripheral vascular disease, unspecified: Secondary | ICD-10-CM | POA: Insufficient documentation

## 2023-03-19 DIAGNOSIS — Z83438 Family history of other disorder of lipoprotein metabolism and other lipidemia: Secondary | ICD-10-CM

## 2023-03-19 DIAGNOSIS — Z955 Presence of coronary angioplasty implant and graft: Secondary | ICD-10-CM

## 2023-03-19 DIAGNOSIS — Z87442 Personal history of urinary calculi: Secondary | ICD-10-CM

## 2023-03-19 DIAGNOSIS — S42291A Other displaced fracture of upper end of right humerus, initial encounter for closed fracture: Secondary | ICD-10-CM | POA: Diagnosis not present

## 2023-03-19 DIAGNOSIS — Z79899 Other long term (current) drug therapy: Secondary | ICD-10-CM

## 2023-03-19 DIAGNOSIS — I509 Heart failure, unspecified: Secondary | ICD-10-CM | POA: Insufficient documentation

## 2023-03-19 DIAGNOSIS — Z8249 Family history of ischemic heart disease and other diseases of the circulatory system: Secondary | ICD-10-CM

## 2023-03-19 DIAGNOSIS — M1990 Unspecified osteoarthritis, unspecified site: Secondary | ICD-10-CM | POA: Diagnosis present

## 2023-03-19 DIAGNOSIS — I2510 Atherosclerotic heart disease of native coronary artery without angina pectoris: Secondary | ICD-10-CM | POA: Insufficient documentation

## 2023-03-19 DIAGNOSIS — I10 Essential (primary) hypertension: Secondary | ICD-10-CM

## 2023-03-19 DIAGNOSIS — Z6832 Body mass index (BMI) 32.0-32.9, adult: Secondary | ICD-10-CM

## 2023-03-19 DIAGNOSIS — J9601 Acute respiratory failure with hypoxia: Secondary | ICD-10-CM | POA: Diagnosis not present

## 2023-03-19 DIAGNOSIS — I959 Hypotension, unspecified: Secondary | ICD-10-CM | POA: Diagnosis present

## 2023-03-19 DIAGNOSIS — E669 Obesity, unspecified: Secondary | ICD-10-CM | POA: Diagnosis present

## 2023-03-19 DIAGNOSIS — R4182 Altered mental status, unspecified: Secondary | ICD-10-CM | POA: Diagnosis not present

## 2023-03-19 DIAGNOSIS — R7303 Prediabetes: Secondary | ICD-10-CM | POA: Diagnosis present

## 2023-03-19 DIAGNOSIS — N390 Urinary tract infection, site not specified: Secondary | ICD-10-CM | POA: Diagnosis present

## 2023-03-19 DIAGNOSIS — I708 Atherosclerosis of other arteries: Secondary | ICD-10-CM | POA: Diagnosis present

## 2023-03-19 DIAGNOSIS — I5032 Chronic diastolic (congestive) heart failure: Secondary | ICD-10-CM | POA: Diagnosis present

## 2023-03-19 DIAGNOSIS — J449 Chronic obstructive pulmonary disease, unspecified: Secondary | ICD-10-CM | POA: Insufficient documentation

## 2023-03-19 DIAGNOSIS — Z888 Allergy status to other drugs, medicaments and biological substances status: Secondary | ICD-10-CM

## 2023-03-19 DIAGNOSIS — I110 Hypertensive heart disease with heart failure: Secondary | ICD-10-CM | POA: Diagnosis present

## 2023-03-19 DIAGNOSIS — E7800 Pure hypercholesterolemia, unspecified: Secondary | ICD-10-CM | POA: Diagnosis present

## 2023-03-19 DIAGNOSIS — Z1152 Encounter for screening for COVID-19: Secondary | ICD-10-CM

## 2023-03-19 DIAGNOSIS — Z7902 Long term (current) use of antithrombotics/antiplatelets: Secondary | ICD-10-CM

## 2023-03-19 DIAGNOSIS — Z885 Allergy status to narcotic agent status: Secondary | ICD-10-CM

## 2023-03-19 DIAGNOSIS — D649 Anemia, unspecified: Secondary | ICD-10-CM

## 2023-03-19 HISTORY — DX: Personal history of urinary calculi: Z87.442

## 2023-03-19 HISTORY — DX: Prediabetes: R73.03

## 2023-03-19 HISTORY — PX: ORIF HUMERUS FRACTURE: SHX2126

## 2023-03-19 LAB — BLOOD GAS, VENOUS
Acid-base deficit: 1.9 mmol/L (ref 0.0–2.0)
Bicarbonate: 25.5 mmol/L (ref 20.0–28.0)
O2 Saturation: 35.5 %
Patient temperature: 37
pCO2, Ven: 53 mm[Hg] (ref 44–60)
pH, Ven: 7.29 (ref 7.25–7.43)
pO2, Ven: <31 mm[Hg] — CL (ref 32–45)

## 2023-03-19 LAB — COMPREHENSIVE METABOLIC PANEL
ALT: 13 U/L (ref 0–44)
AST: 29 U/L (ref 15–41)
Albumin: 3.6 g/dL (ref 3.5–5.0)
Alkaline Phosphatase: 77 U/L (ref 38–126)
Anion gap: 11 (ref 5–15)
BUN: 13 mg/dL (ref 8–23)
CO2: 20 mmol/L — ABNORMAL LOW (ref 22–32)
Calcium: 8.6 mg/dL — ABNORMAL LOW (ref 8.9–10.3)
Chloride: 105 mmol/L (ref 98–111)
Creatinine, Ser: 0.99 mg/dL (ref 0.44–1.00)
GFR, Estimated: >60 mL/min (ref >60)
Glucose, Bld: 138 mg/dL — ABNORMAL HIGH (ref 70–99)
Potassium: 5.1 mmol/L (ref 3.5–5.1)
Sodium: 136 mmol/L (ref 135–145)
Total Bilirubin: 0.8 mg/dL (ref 0.3–1.2)
Total Protein: 7.2 g/dL (ref 6.5–8.1)

## 2023-03-19 LAB — BASIC METABOLIC PANEL
Anion gap: 9 (ref 5–15)
BUN: 14 mg/dL (ref 8–23)
CO2: 25 mmol/L (ref 22–32)
Calcium: 9.1 mg/dL (ref 8.9–10.3)
Chloride: 105 mmol/L (ref 98–111)
Creatinine, Ser: 0.99 mg/dL (ref 0.44–1.00)
GFR, Estimated: >60 mL/min (ref >60)
Glucose, Bld: 109 mg/dL — ABNORMAL HIGH (ref 70–99)
Potassium: 3.7 mmol/L (ref 3.5–5.1)
Sodium: 139 mmol/L (ref 135–145)

## 2023-03-19 LAB — CBC WITH DIFFERENTIAL/PLATELET
Abs Immature Granulocytes: 0.13 10*3/uL — ABNORMAL HIGH (ref 0.00–0.07)
Basophils Absolute: 0.1 10*3/uL (ref 0.0–0.1)
Basophils Relative: 0 %
Eosinophils Absolute: 0 10*3/uL (ref 0.0–0.5)
Eosinophils Relative: 0 %
HCT: 40 % (ref 36.0–46.0)
Hemoglobin: 12 g/dL (ref 12.0–15.0)
Immature Granulocytes: 1 %
Lymphocytes Relative: 6 %
Lymphs Abs: 1 10*3/uL (ref 0.7–4.0)
MCH: 27.1 pg (ref 26.0–34.0)
MCHC: 30 g/dL (ref 30.0–36.0)
MCV: 90.5 fL (ref 80.0–100.0)
Monocytes Absolute: 0.1 10*3/uL (ref 0.1–1.0)
Monocytes Relative: 1 %
Neutro Abs: 15.2 10*3/uL — ABNORMAL HIGH (ref 1.7–7.7)
Neutrophils Relative %: 92 %
Platelets: 341 10*3/uL (ref 150–400)
RBC: 4.42 MIL/uL (ref 3.87–5.11)
RDW: 14.7 % (ref 11.5–15.5)
WBC: 16.5 10*3/uL — ABNORMAL HIGH (ref 4.0–10.5)
nRBC: 0 % (ref 0.0–0.2)

## 2023-03-19 LAB — CBC
HCT: 43.3 % (ref 36.0–46.0)
Hemoglobin: 13 g/dL (ref 12.0–15.0)
MCH: 27 pg (ref 26.0–34.0)
MCHC: 30 g/dL (ref 30.0–36.0)
MCV: 89.8 fL (ref 80.0–100.0)
Platelets: 340 10*3/uL (ref 150–400)
RBC: 4.82 MIL/uL (ref 3.87–5.11)
RDW: 14.7 % (ref 11.5–15.5)
WBC: 15.7 10*3/uL — ABNORMAL HIGH (ref 4.0–10.5)
nRBC: 0 % (ref 0.0–0.2)

## 2023-03-19 LAB — D-DIMER, QUANTITATIVE: D-Dimer, Quant: 0.79 ug{FEU}/mL — ABNORMAL HIGH (ref 0.00–0.50)

## 2023-03-19 LAB — I-STAT CG4 LACTIC ACID, ED: Lactic Acid, Venous: 3.9 mmol/L (ref 0.5–1.9)

## 2023-03-19 LAB — BRAIN NATRIURETIC PEPTIDE: B Natriuretic Peptide: 65.6 pg/mL (ref 0.0–100.0)

## 2023-03-19 LAB — RESP PANEL BY RT-PCR (RSV, FLU A&B, COVID)  RVPGX2
Influenza A by PCR: NEGATIVE
Influenza B by PCR: NEGATIVE
Resp Syncytial Virus by PCR: NEGATIVE
SARS Coronavirus 2 by RT PCR: NEGATIVE

## 2023-03-19 LAB — CBG MONITORING, ED: Glucose-Capillary: 140 mg/dL — ABNORMAL HIGH (ref 70–99)

## 2023-03-19 SURGERY — OPEN REDUCTION INTERNAL FIXATION (ORIF) PROXIMAL HUMERUS FRACTURE
Anesthesia: General | Site: Shoulder | Laterality: Right

## 2023-03-19 MED ORDER — LIDOCAINE HCL (CARDIAC) PF 100 MG/5ML IV SOSY
PREFILLED_SYRINGE | INTRAVENOUS | Status: DC | PRN
  Administered 2023-03-19: 60 mg via INTRAVENOUS

## 2023-03-19 MED ORDER — PHENYLEPHRINE HCL (PRESSORS) 10 MG/ML IV SOLN
INTRAVENOUS | Status: DC | PRN
Start: 2023-03-19 — End: 2023-03-19
  Administered 2023-03-19: 160 ug via INTRAVENOUS
  Administered 2023-03-19: 80 ug via INTRAVENOUS

## 2023-03-19 MED ORDER — CHLORHEXIDINE GLUCONATE 0.12 % MT SOLN: 15.0000 mL | Freq: Once | OROMUCOSAL | Status: DC

## 2023-03-19 MED ORDER — LIDOCAINE HCL (PF) 2 % IJ SOLN
INTRAMUSCULAR | Status: AC
  Filled 2023-03-19: qty 10

## 2023-03-19 MED ORDER — HYDROCODONE-ACETAMINOPHEN 5-325 MG PO TABS: 1.0000 | ORAL_TABLET | ORAL | 0 refills | Status: DC | PRN

## 2023-03-19 MED ORDER — FENTANYL CITRATE (PF) 100 MCG/2ML IJ SOLN
INTRAMUSCULAR | Status: AC
  Filled 2023-03-19: qty 2

## 2023-03-19 MED ORDER — EPHEDRINE SULFATE (PRESSORS) 50 MG/ML IJ SOLN
INTRAMUSCULAR | Status: DC | PRN
Start: 2023-03-19 — End: 2023-03-19
  Administered 2023-03-19 (×2): 10 mg via INTRAVENOUS
  Administered 2023-03-19: 5 mg via INTRAVENOUS

## 2023-03-19 MED ORDER — ROCURONIUM BROMIDE 10 MG/ML (PF) SYRINGE
PREFILLED_SYRINGE | INTRAVENOUS | Status: AC
  Filled 2023-03-19: qty 20

## 2023-03-19 MED ORDER — FENTANYL CITRATE PF 50 MCG/ML IJ SOSY: 50.0000 ug | PREFILLED_SYRINGE | Freq: Once | INTRAMUSCULAR | Status: AC

## 2023-03-19 MED ORDER — ROCURONIUM BROMIDE 100 MG/10ML IV SOLN
INTRAVENOUS | Status: DC | PRN
  Administered 2023-03-19: 60 mg via INTRAVENOUS

## 2023-03-19 MED ORDER — CYCLOBENZAPRINE HCL 10 MG PO TABS
10.0000 mg | ORAL_TABLET | Freq: Three times a day (TID) | ORAL | 1 refills | Status: AC | PRN
Start: 2023-03-19 — End: ?

## 2023-03-19 MED ORDER — ONDANSETRON HCL 4 MG/2ML IJ SOLN
INTRAMUSCULAR | Status: DC | PRN
  Administered 2023-03-19: 4 mg via INTRAVENOUS

## 2023-03-19 MED ORDER — EPHEDRINE 5 MG/ML INJ
INTRAVENOUS | Status: AC
  Filled 2023-03-19: qty 5

## 2023-03-19 MED ORDER — SODIUM CHLORIDE 0.9 % IV BOLUS
1000.0000 mL | Freq: Once | INTRAVENOUS | Status: AC
  Administered 2023-03-19: 1000 mL via INTRAVENOUS

## 2023-03-19 MED ORDER — MIDAZOLAM HCL 2 MG/2ML IJ SOLN: 1.0000 mg | Freq: Once | INTRAMUSCULAR | Status: AC

## 2023-03-19 MED ORDER — DEXAMETHASONE SODIUM PHOSPHATE 10 MG/ML IJ SOLN
INTRAMUSCULAR | Status: DC | PRN
Start: 2023-03-19 — End: 2023-03-19
  Administered 2023-03-19: 10 mg via INTRAVENOUS

## 2023-03-19 MED ORDER — CEFAZOLIN SODIUM-DEXTROSE 2-4 GM/100ML-% IV SOLN
2.0000 g | INTRAVENOUS | Status: AC
  Administered 2023-03-19: 2 g via INTRAVENOUS
  Filled 2023-03-19: qty 100

## 2023-03-19 MED ORDER — LACTATED RINGERS IV SOLN: INTRAVENOUS | Status: DC

## 2023-03-19 MED ORDER — TRANEXAMIC ACID-NACL 1000-0.7 MG/100ML-% IV SOLN
1000.0000 mg | INTRAVENOUS | Status: AC
  Administered 2023-03-19: 1000 mg via INTRAVENOUS
  Filled 2023-03-19: qty 100

## 2023-03-19 MED ORDER — PROPOFOL 10 MG/ML IV BOLUS
INTRAVENOUS | Status: DC | PRN
  Administered 2023-03-19: 120 mg via INTRAVENOUS

## 2023-03-19 MED ORDER — FENTANYL CITRATE PF 50 MCG/ML IJ SOSY: 25.0000 ug | PREFILLED_SYRINGE | INTRAMUSCULAR | Status: DC | PRN

## 2023-03-19 MED ORDER — PHENYLEPHRINE HCL (PRESSORS) 10 MG/ML IV SOLN
INTRAVENOUS | Status: AC
  Filled 2023-03-19: qty 1

## 2023-03-19 MED ORDER — STERILE WATER FOR IRRIGATION IR SOLN
Status: DC | PRN
Start: 2023-03-19 — End: 2023-03-19
  Administered 2023-03-19: 1000 mL

## 2023-03-19 MED ORDER — ONDANSETRON HCL 4 MG/2ML IJ SOLN: 4.0000 mg | Freq: Once | INTRAMUSCULAR | Status: DC | PRN

## 2023-03-19 MED ORDER — ORAL CARE MOUTH RINSE: 15.0000 mL | Freq: Once | OROMUCOSAL | Status: DC

## 2023-03-19 MED ORDER — MIDAZOLAM HCL 2 MG/2ML IJ SOLN
INTRAMUSCULAR | Status: AC
  Administered 2023-03-19: 1 mg via INTRAVENOUS
  Filled 2023-03-19: qty 2

## 2023-03-19 MED ORDER — BUPIVACAINE LIPOSOME 1.3 % IJ SUSP
INTRAMUSCULAR | Status: DC | PRN
Start: 2023-03-19 — End: 2023-03-19
  Administered 2023-03-19: 10 mL via PERINEURAL

## 2023-03-19 MED ORDER — BUPIVACAINE HCL (PF) 0.5 % IJ SOLN
INTRAMUSCULAR | Status: DC | PRN
Start: 2023-03-19 — End: 2023-03-19
  Administered 2023-03-19: 20 mL via PERINEURAL

## 2023-03-19 MED ORDER — IPRATROPIUM-ALBUTEROL 0.5-2.5 (3) MG/3ML IN SOLN
3.0000 mL | Freq: Once | RESPIRATORY_TRACT | Status: AC
  Administered 2023-03-20: 3 mL via RESPIRATORY_TRACT
  Filled 2023-03-19: qty 3

## 2023-03-19 MED ORDER — ONDANSETRON HCL 4 MG/2ML IJ SOLN
INTRAMUSCULAR | Status: AC
  Filled 2023-03-19: qty 2

## 2023-03-19 MED ORDER — ONDANSETRON HCL 4 MG PO TABS: 4.0000 mg | ORAL_TABLET | Freq: Three times a day (TID) | ORAL | 0 refills | Status: AC | PRN

## 2023-03-19 MED ORDER — PHENYLEPHRINE 80 MCG/ML (10ML) SYRINGE FOR IV PUSH (FOR BLOOD PRESSURE SUPPORT)
PREFILLED_SYRINGE | INTRAVENOUS | Status: AC
  Filled 2023-03-19: qty 10

## 2023-03-19 MED ORDER — FENTANYL CITRATE PF 50 MCG/ML IJ SOSY
PREFILLED_SYRINGE | INTRAMUSCULAR | Status: AC
  Administered 2023-03-19: 50 ug via INTRAVENOUS
  Filled 2023-03-19: qty 2

## 2023-03-19 MED ORDER — DEXAMETHASONE SODIUM PHOSPHATE 10 MG/ML IJ SOLN
INTRAMUSCULAR | Status: AC
  Filled 2023-03-19: qty 2

## 2023-03-19 MED ORDER — PROPOFOL 10 MG/ML IV BOLUS
INTRAVENOUS | Status: AC
  Filled 2023-03-19: qty 20

## 2023-03-19 MED ORDER — 0.9 % SODIUM CHLORIDE (POUR BTL) OPTIME
TOPICAL | Status: DC | PRN
Start: 2023-03-19 — End: 2023-03-19
  Administered 2023-03-19: 1000 mL

## 2023-03-19 MED ORDER — PHENYLEPHRINE HCL-NACL 20-0.9 MG/250ML-% IV SOLN
INTRAVENOUS | Status: DC | PRN
Start: 2023-03-19 — End: 2023-03-19
  Administered 2023-03-19: 80 ug/min via INTRAVENOUS

## 2023-03-19 MED ORDER — SUGAMMADEX SODIUM 200 MG/2ML IV SOLN
INTRAVENOUS | Status: DC | PRN
Start: 2023-03-19 — End: 2023-03-19
  Administered 2023-03-19: 200 mg via INTRAVENOUS

## 2023-03-19 MED ORDER — IOHEXOL 350 MG/ML SOLN
80.0000 mL | Freq: Once | INTRAVENOUS | Status: AC | PRN
  Administered 2023-03-19: 80 mL via INTRAVENOUS

## 2023-03-19 SURGICAL SUPPLY — 63 items
ADH SKN CLS APL DERMABOND .7 (GAUZE/BANDAGES/DRESSINGS) ×1
AID PSTN UNV HD RSTRNT DISP (MISCELLANEOUS) ×1
BAG COUNTER SPONGE SURGICOUNT (BAG) IMPLANT
BAG SPEC THK2 15X12 ZIP CLS (MISCELLANEOUS) ×1
BAG SPNG CNTER NS LX DISP (BAG)
BAG ZIPLOCK 12X15 (MISCELLANEOUS) ×1 IMPLANT
BIT DRILL 3.2 (BIT) ×1
BIT DRILL 3.2XCALB NS DISP (BIT) IMPLANT
BIT DRILL CALIBRATED 2.7 (BIT) IMPLANT
BIT DRL 3.2XCALB NS DISP (BIT) ×1
COOLER ICEMAN CLASSIC (MISCELLANEOUS) IMPLANT
COVER SURGICAL LIGHT HANDLE (MISCELLANEOUS) ×1 IMPLANT
DERMABOND ADVANCED .7 DNX12 (GAUZE/BANDAGES/DRESSINGS) ×1 IMPLANT
DRAPE C-ARM 42X120 X-RAY (DRAPES) ×1 IMPLANT
DRAPE C-ARMOR (DRAPES) IMPLANT
DRAPE ORTHO SPLIT 77X108 STRL (DRAPES) ×2
DRAPE SURG 17X11 SM STRL (DRAPES) ×1 IMPLANT
DRAPE SURG ORHT 6 SPLT 77X108 (DRAPES) ×2 IMPLANT
DRAPE U-SHAPE 47X51 STRL (DRAPES) ×1 IMPLANT
DRSG AQUACEL AG ADV 3.5X 6 (GAUZE/BANDAGES/DRESSINGS) IMPLANT
DRSG AQUACEL AG ADV 3.5X10 (GAUZE/BANDAGES/DRESSINGS) IMPLANT
DURAPREP 26ML APPLICATOR (WOUND CARE) ×1 IMPLANT
ELECT BLADE TIP CTD 4 INCH (ELECTRODE) ×1 IMPLANT
ELECT REM PT RETURN 15FT ADLT (MISCELLANEOUS) ×1 IMPLANT
GLOVE BIO SURGEON STRL SZ7.5 (GLOVE) ×1 IMPLANT
GLOVE BIO SURGEON STRL SZ8 (GLOVE) ×1 IMPLANT
GLOVE SS BIOGEL STRL SZ 7 (GLOVE) ×1 IMPLANT
GLOVE SS BIOGEL STRL SZ 7.5 (GLOVE) ×1 IMPLANT
GOWN STRL REUS W/ TWL LRG LVL3 (GOWN DISPOSABLE) ×2 IMPLANT
GOWN STRL REUS W/TWL LRG LVL3 (GOWN DISPOSABLE) ×2
K-WIRE 2X5 SS THRDED S3 (WIRE) ×1
KIT BASIN OR (CUSTOM PROCEDURE TRAY) ×1 IMPLANT
KIT TURNOVER KIT A (KITS) IMPLANT
KWIRE 2X5 SS THRDED S3 (WIRE) IMPLANT
MANIFOLD NEPTUNE II (INSTRUMENTS) ×1 IMPLANT
NDL TAPERED W/ NITINOL LOOP (MISCELLANEOUS) IMPLANT
NEEDLE TAPERED W/ NITINOL LOOP (MISCELLANEOUS) IMPLANT
NS IRRIG 1000ML POUR BTL (IV SOLUTION) ×1 IMPLANT
PACK SHOULDER (CUSTOM PROCEDURE TRAY) ×1 IMPLANT
PAD ARMBOARD 7.5X6 YLW CONV (MISCELLANEOUS) ×2 IMPLANT
PAD COLD SHLDR WRAP-ON (PAD) IMPLANT
PEG LOCKING 3.2MMX44 (Peg) IMPLANT
PEG LOCKING 3.2X 28MM (Peg) IMPLANT
PEG LOCKING 3.2X32 (Peg) IMPLANT
PEG LOCKING 3.2X40 (Peg) IMPLANT
PEG LOCKING 3.2X48 (Peg) IMPLANT
PLATE PROX HUM HI R 3H 80 (Plate) IMPLANT
PROTECTOR NERVE ULNAR (MISCELLANEOUS) ×1 IMPLANT
RESTRAINT HEAD UNIVERSAL NS (MISCELLANEOUS) ×1 IMPLANT
SCREW LP NL T15 3.5X26 (Screw) IMPLANT
SCREW PEG LOCK 3.2X30MM (Screw) IMPLANT
SLING ARM FOAM STRAP LRG (SOFTGOODS) IMPLANT
SLING ARM FOAM STRAP MED (SOFTGOODS) IMPLANT
SLING ARM FOAM STRAP SML (SOFTGOODS) IMPLANT
SUCTION TUBE FRAZIER 12FR DISP (SUCTIONS) ×1 IMPLANT
SUT FIBERWIRE #2 38 T-5 BLUE (SUTURE)
SUT MNCRL AB 3-0 PS2 18 (SUTURE) ×1 IMPLANT
SUT MON AB 2-0 CT1 36 (SUTURE) ×1 IMPLANT
SUT VIC AB 1 CT1 36 (SUTURE) ×1 IMPLANT
SUTURE FIBERWR #2 38 T-5 BLUE (SUTURE) IMPLANT
TOWEL OR 17X26 10 PK STRL BLUE (TOWEL DISPOSABLE) ×1 IMPLANT
TOWEL OR NON WOVEN STRL DISP B (DISPOSABLE) ×1 IMPLANT
YANKAUER SUCT BULB TIP 10FT TU (MISCELLANEOUS) ×1 IMPLANT

## 2023-03-19 NOTE — ED Notes (Signed)
Daughter at bedside.

## 2023-03-19 NOTE — Anesthesia Procedure Notes (Signed)
Anesthesia Regional Block: Interscalene brachial plexus block   Pre-Anesthetic Checklist: , timeout performed,  Correct Patient, Correct Site, Correct Laterality,  Correct Procedure, Correct Position, site marked,  Risks and benefits discussed,  Surgical consent,  Pre-op evaluation,  At surgeon's request and post-op pain management  Laterality: Right  Prep: chloraprep       Needles:  Injection technique: Single-shot  Needle Type: Echogenic Stimulator Needle     Needle Length: 10cm  Needle Gauge: 21   Needle insertion depth: 6 cm   Additional Needles:   Procedures:,,,, ultrasound used (permanent image in chart),,   Motor weakness within 5 minutes.  Narrative:  Start time: 03/19/2023 11:09 AM End time: 03/19/2023 11:13 AM Injection made incrementally with aspirations every 5 mL.  Performed by: Personally  Anesthesiologist: Mal Amabile, MD  Additional Notes: Timeout performed. Patient sedated. Relevant anatomy ID'd using [place]. Incremental 2-77ml injection of LA with frequent aspiration. Patient tolerated procedure well.     Right Interscalene Block

## 2023-03-19 NOTE — Anesthesia Procedure Notes (Signed)
Procedure Name: Intubation Date/Time: 03/19/2023 12:08 PM  Performed by: Nathen May, CRNAPre-anesthesia Checklist: Patient identified, Emergency Drugs available, Suction available and Patient being monitored Patient Re-evaluated:Patient Re-evaluated prior to induction Oxygen Delivery Method: Circle System Utilized Preoxygenation: Pre-oxygenation with 100% oxygen Induction Type: IV induction Ventilation: Mask ventilation without difficulty Laryngoscope Size: Mac and 3 Grade View: Grade II Tube type: Oral Tube size: 7.0 mm Number of attempts: 1 Airway Equipment and Method: Stylet and Oral airway Placement Confirmation: ETT inserted through vocal cords under direct vision, positive ETCO2 and breath sounds checked- equal and bilateral Secured at: 21 cm Tube secured with: Tape Dental Injury: Teeth and Oropharynx as per pre-operative assessment

## 2023-03-19 NOTE — Op Note (Signed)
03/19/2023  1:27 PM  PATIENT:   Brenda [name]  62 y.o. female  PRE-OPERATIVE DIAGNOSIS:  Right displaced 2 part proximal humerus fracture  POST-OPERATIVE DIAGNOSIS: Same  PROCEDURE: Open reduction and internal fixation of the right 2 part proximal humerus fracture utilizing a Biomet proximal humeral plate  SURGEON:  Cherilyn Sautter, Vania Rea M.D.  ASSISTANTS: Ralene Bathe, PA-C  Ralene Bathe, PA-C was utilized as an Geophysicist/field seismologist throughout this case, essential for help with positioning the patient, positioning extremity, tissue manipulation, implantation of the prosthesis, suture management, wound closure, and intraoperative decision-making.  ANESTHESIA:   General Endotracheal and interscalene block with Exparel  EBL: 150 cc  SPECIMEN: None  Drains: None   PATIENT DISPOSITION:  PACU - hemodynamically stable.    PLAN OF CARE: Discharge to home after PACU  Brief history:  Patient is a 62 year old female status post ground-level fall sustaining a displaced right 2 part proximal humerus fracture.  Due to the degree of displacement she is brought to the operating this time for planned open reduction and internal fixation.  Preoperatively she was counseled regarding treatment options as well as potential risks versus benefits there.  Possible surgical complications were reviewed including the potential bleeding, infection, neurovascular injury, persistent pain, loss of motion, failure of fixation, malunion, nonunion, loss of fixation, anesthetic complication, and possible need for additional surgery.  She understands, and accepts, and agrees with the planned procedure.  Procedure detail:  After undergoing routine preop evaluation the patient received prophylactic antibiotics and interscalene block with Exparel was established in the holding area by the anesthesia department.  Subsequently placed spine on the operative table and underwent the smooth induction of a general endotracheal  anesthesia.  Placed into a beachchair position and appropriately padded and protected.  Fluoroscopic imaging was then brought in and used to confirm the proper visualization could be achieved radiographically.  The right shoulder girdle region was then sterilely prepped and draped in standard fashion.  Timeout was called.  A deltopectoral approach the right shoulder was made through an approximate 10 cm incision.  Skin flaps were elevated dissection carried deeply and the deltopectoral interval was then developed from proximal to distal with the vein taken laterally.  The deltoid was mobilized and the interval of the subacromial/subdeltoid space was then developed bluntly.  Conjoined tendon mobilized and retracted medially.  The fracture site was readily identified and dissection carried about the medial margin of the humeral shaft to allow manipulation of the fracture site.  A Crable elevator was introduced into the fracture site to elevate the head and assist with the reduction with overall good alignment was achieved.  We then provisionally placed a Biomet 3-hole proximal humeral plate over the lateral margin of the proximal humerus and held this provisionally with a bone-holding clamp.  Fluoroscopic imaging was then brought in to confirm overall position and alignment and the positioning was then adjusted and once we were satisfied a guidepin was then directed into the center of the humeral head confirmed on 90 degree orthogonal views.  With proper alignment achieved we then provisionally transfixed the plate to the shaft with a single 3 5 cortical screw.  A series of locking pegs were then passed up into the humeral head with proper positioning confirmed fluoroscopically.  The balance of the humeral shaft screws were then placed with good purchase.  Guidepin was removed.  Final imaging showed good alignment of the fracture site good position the hardware all much to our satisfaction.  The wound  was copiously  irrigated.  Final hemostasis was obtained.  The deltopectoral interval was reapproximated with a series of figure-of-eight number Vicryl sutures.  2-0 Monocryl used to close the subcu layer and intracuticular 3-0 Monocryl used to close the skin followed by Dermabond and Aquacel dressing.  The right arm placed into a sling and the patient was awakened, extubated, and taken to the recovery room in stable condition.  Senaida Lange MD   Contact # [phone]

## 2023-03-19 NOTE — Anesthesia Postprocedure Evaluation (Signed)
Anesthesia Post Note  Patient: Brenda [name]  Procedure(s) Performed: OPEN REDUCTION INTERNAL FIXATION (ORIF) PROXIMAL HUMERUS FRACTURE (Right: Shoulder)     Patient location during evaluation: PACU Anesthesia Type: General Level of consciousness: awake and alert Pain management: pain level controlled Vital Signs Assessment: post-procedure vital signs reviewed and stable Respiratory status: spontaneous breathing, nonlabored ventilation, respiratory function stable and patient connected to nasal cannula oxygen Cardiovascular status: blood pressure returned to baseline and stable Postop Assessment: no apparent nausea or vomiting Anesthetic complications: no   No notable events documented.  Last Vitals:  Vitals:   03/19/23 1558 03/19/23 1651  BP:  (!) 91/54  Pulse:  76  Resp:    Temp:    SpO2: 90% (!) 89%    Last Pain:  Vitals:   03/19/23 1545  TempSrc:   PainSc: 0-No pain                 Jaxie Racanelli

## 2023-03-19 NOTE — H&P (Signed)
Brenda [name]    Chief Complaint: Right displaced 2 part proximal humerus fracture HPI: The patient is a 62 y.o. female status post ground-level fall sustaining a displaced right 2 part proximal humerus fracture.  Due to the degree of displacement patient is brought to the operating today for planned open reduction and internal fixation  Past Medical History:  Diagnosis Date   Anemia    Arthritis    CAD (coronary artery disease)    Cardiomyopathy    Carotid artery occlusion    CHF (congestive heart failure) (HCC) 2007   COPD (chronic obstructive pulmonary disease) (HCC)    not on home o2   History of kidney stones    HTN (hypertension)    Hypercholesteremia    Pre-diabetes    Pulmonary nodule    PVD (peripheral vascular disease) (HCC)    Renal artery stenosis (HCC)       Past Surgical History:  Procedure Laterality Date   ABDOMINAL AORTOGRAM W/LOWER EXTREMITY N/A 05/11/2019   Procedure: ABDOMINAL AORTOGRAM W/LOWER EXTREMITY;  Surgeon: Iran Ouch, MD;  Location: [facility];  Service: Cardiovascular;  Laterality: N/A;   CARDIAC CATHETERIZATION     2007   CHOLECYSTECTOMY N/A 03/01/2019   Procedure: LAPAROSCOPIC CHOLECYSTECTOMY;  Surgeon: Axel Filler, MD;  Location: [facility];  Service: General;  Laterality: N/A;   CORONARY STENT PLACEMENT      Successful stenting of the right renal ostium   complicated by distal wire perforation.  The wire perforation was  treated with embolization of the branch vessels involved.  Will check CT   scan to assess the right renal parenchymal hematoma.  She will be   maintained on both aspirin and Plavix.  Will place a Foley catheter to   avoid clotting since hematuria is likely.     EXTERNAL EAR SURGERY     as a child   PERIPHERAL VASCULAR INTERVENTION  05/11/2019   Procedure: PERIPHERAL VASCULAR INTERVENTION;  Surgeon: Iran Ouch, MD;  Location: [facility];  Service: Cardiovascular;;  Bilater Iliac    Family History   Problem Relation Age of Onset   Heart disease Mother 55       Heart Disease before age 73   Hypertension Mother    Hyperlipidemia Mother    Peripheral vascular disease Mother    Cancer Father    Hyperlipidemia Father    Hypertension Father    Coronary artery disease Other     Social History:  reports that she quit smoking about 17 years ago. Her smoking use included cigarettes. She started smoking about 48 years ago. She has a 31 pack-year smoking history. She has never used smokeless tobacco. She reports that she does not drink alcohol and does not use drugs.  BMI: Estimated body mass index is 32.95 kg/m as calculated from the following:   Height as of this encounter: 5\' 5"  (1.651 m).   Weight as of this encounter: 89.8 kg.  Lab Results  Component Value Date   ALBUMIN 4.7 03/15/2020   Diabetes: Patient does not have a diagnosis of diabetes.     Smoking Status:       No medications prior to admission.     Physical Exam: Office examination demonstrates patient to be in severe pain secondary to her right shoulder injury.  She is neurovascular intact in the right upper extremity.  She is extremely guarded.  Skin is intact.  Neurovascular intact distally in the right upper extremity.  Radiographs  Plain films of the right shoulder confirm a severely displaced right 2 part proximal humerus fracture.  Vitals  Weight:  [89.8 kg] 89.8 kg (10/09 1448)  Assessment/Plan  Impression: Right displaced proximal humerus fracture  Plan of Action: Procedure(s): OPEN REDUCTION INTERNAL FIXATION (ORIF) PROXIMAL HUMERUS FRACTURE  Brenda [name] 03/19/2023, 5:55 AM Contact # [phone]

## 2023-03-19 NOTE — Discharge Instructions (Signed)
Vania Rea. Supple, M.D., F.A.A.O.S. [facility] in Arthroscopic and Reconstructive Surgery of the Shoulder [phone] [address] [place], [place] [address] - Fax [phone]   POST-OP OPEN REDUCTION INTERNAL FIXATION PROXIMAL HUMERUS INSTRUCTIONS  1. Follow up in the office for your first post-op appointment 10-14 days from the date of your surgery. If you do not already have a scheduled appointment, our office will contact you to schedule.  2. The bandage over your incision is waterproof. You may begin showering with this dressing on. You may leave this dressing on until first follow up appointment within 2 weeks. We prefer you leave this dressing in place until follow up however after 5-7 days if you are having itching or skin irritation and would like to remove it you may do so. Go slow and tug at the borders gently to break the bond the dressing has with the skin. At this point if there is no drainage it is okay to go without a bandage or you may cover it with a light guaze and tape. You can also expect significant bruising around your shoulder that will drift down your arm and into your chest wall. This is very normal and should resolve over several days.   3. Wear your sling/immobilizer at all times except to perform the exercises below or to occasionally let your arm dangle by your side to stretch your elbow. You also need to sleep in your sling immobilizer until instructed otherwise. It is ok to remove your sling if you are sitting in a controlled environment and allow your arm to rest in a position of comfort by your side or on your lap with pillows to give your neck and skin a break from the sling. You may remove it to allow arm to dangle by side to shower. If you are up walking around and when you go to sleep at night you need to wear it.  4. Range of motion to your elbow, wrist, and hand are encouraged 3-5 times daily. Exercise to your hand and  fingers helps to reduce swelling you may experience.   5. Prescriptions for a pain medication and a muscle relaxant are provided for you. It is recommended that if you are experiencing pain that you pain medication alone is not controlling, add the muscle relaxant along with the pain medication which can give additional pain relief. The first 1-2 days is generally the most severe of your pain and then should gradually decrease. As your pain lessens it is recommended that you decrease your use of the pain medications to an "as needed basis'" only and to always comply with the recommended dosages of the pain medications.  6. Pain medications can produce constipation along with their use. If you experience this, the use of an over the counter stool softener or laxative daily is recommended.   7. For additional questions or concerns, please do not hesitate to call the office. If after hours there is an answering service to forward your concerns to the physician on call.  8.Pain control following an exparel block  To help control your post-operative pain you received a nerve block  performed with Exparel which is a long acting anesthetic (numbing agent) which can provide pain relief and sensations of numbness (and relief of pain) in the operative shoulder and arm for up to 3 days. Sometimes it provides mixed relief, meaning you may still have numbness in certain areas of the arm but can still  be able to move  parts of that arm, hand, and fingers. We recommend that your prescribed pain medications  be used as needed. We do not feel it is necessary to "pre medicate" and "stay ahead" of pain.  Taking narcotic pain medications when you are not having any pain can lead to unnecessary and potentially dangerous side effects.    9. Use the ice machine as much as possible in the first 5-7 days from surgery, then you can wean its use to as needed. The ice typically needs to be replaced every 6 hours, instead of ice  you can actually freeze water bottles to put in the cooler and then fill water around them to avoid having to purchase ice. You can have spare water bottles freezing to allow you to rotate them once they have melted. Try to have a thin shirt or light cloth or towel under the ice wrap to protect your skin.   FOR ADDITIONAL INFO ON ICE MACHINE AND INSTRUCTIONS GO TO THE WEBSITE AT  [email or web address]   POST-OP EXERCISES  OK to allow arm to dangle and move elbow wrist and hand for hygiene

## 2023-03-19 NOTE — ED Triage Notes (Signed)
BIB EMS/ AMS reported by family/ shoulder surgery today, D/C'd @ 1500 today/ hx of complication with anesthesia/ hypotension upon EMS arrival/ hypoxic, on non-rebreather 94%/ pt has no complaints at this time

## 2023-03-19 NOTE — Transfer of Care (Signed)
Immediate Anesthesia Transfer of Care Note  Patient: DWAN FENNEL  Procedure(s) Performed: OPEN REDUCTION INTERNAL FIXATION (ORIF) PROXIMAL HUMERUS FRACTURE (Right: Shoulder)  Patient Location: PACU  Anesthesia Type:General and Regional  Level of Consciousness: oriented and drowsy  Airway & Oxygen Therapy: Patient Spontanous Breathing and Patient connected to face mask oxygen  Post-op Assessment: Report given to RN and Post -op Vital signs reviewed and stable  Post vital signs: Reviewed and stable  Last Vitals:  Vitals Value Taken Time  BP 81/57 03/19/23 1356  Temp    Pulse 75 03/19/23 1358  Resp 20 03/19/23 1358  SpO2 90 % 03/19/23 1358  Vitals shown include unfiled device data.  Last Pain:  Vitals:   03/19/23 1153  TempSrc:   PainSc: 0-No pain      Patients Stated Pain Goal: 4 (03/19/23 1120)  Complications: No notable events documented.

## 2023-03-19 NOTE — ED Provider Notes (Signed)
Brenda [name] Provider Note   CSN: [id number] Arrival date & time: 03/19/23  2124     History  Chief Complaint  Patient presents with   Altered Mental Status    [name] is a 62 y.o. female with a past medical history of CAD, carotid artery occlusion, CHF, COPD, hypertension, prediabetes, PVD, renal artery stenosis presents today for evaluation of hypoxia.  Patient had a proximal humeral fixation this morning.  She will discharge home in the afternoon today.  Per family at bedside patient's daughter was trying to wake the patient up but she was nonresponsive for 2 or 3 minutes and just stared in the blank.  She then returned to her normal self.  Upon EMS arrival patient was found to be hypoxic.  Patient family at bedside state at this morning before the surgery her oxygen dropped to the mid 80s so she was placed on 6 L satting 90%.  Patient does not wear oxygen at home.  She has history of COPD, last use of albuterol inhaler was 2 weeks ago.  She stated that she quit smoking in 2017.  He denies any chest pain, cough, runny nose, fever.   Altered Mental Status   Past Medical History:  Diagnosis Date   Anemia    Arthritis    CAD (coronary artery disease)    Cardiomyopathy    Carotid artery occlusion    CHF (congestive heart failure) (HCC) 2007   COPD (chronic obstructive pulmonary disease) (HCC)    not on home o2   History of kidney stones    HTN (hypertension)    Hypercholesteremia    Pre-diabetes    Pulmonary nodule    PVD (peripheral vascular disease) (HCC)    Renal artery stenosis (HCC)    Past Surgical History:  Procedure Laterality Date   ABDOMINAL AORTOGRAM W/LOWER EXTREMITY N/A 05/11/2019   Procedure: ABDOMINAL AORTOGRAM W/LOWER EXTREMITY;  Surgeon: Iran Ouch, MD;  Location: [facility];  Service: Cardiovascular;  Laterality: N/A;   CARDIAC CATHETERIZATION     2007   CHOLECYSTECTOMY N/A 03/01/2019    Procedure: LAPAROSCOPIC CHOLECYSTECTOMY;  Surgeon: Axel Filler, MD;  Location: [facility];  Service: General;  Laterality: N/A;   CORONARY STENT PLACEMENT      Successful stenting of the right renal ostium   complicated by distal wire perforation.  The wire perforation was  treated with embolization of the branch vessels involved.  Will check CT   scan to assess the right renal parenchymal hematoma.  She will be   maintained on both aspirin and Plavix.  Will place a Foley catheter to   avoid clotting since hematuria is likely.     EXTERNAL EAR SURGERY     as a child   PERIPHERAL VASCULAR INTERVENTION  05/11/2019   Procedure: PERIPHERAL VASCULAR INTERVENTION;  Surgeon: Iran Ouch, MD;  Location: [facility];  Service: Cardiovascular;;  Bilater Iliac     Home Medications Prior to Admission medications   Medication Sig Start Date End Date Taking? Authorizing Provider  Acetaminophen 500 MG capsule Take 1 capsule by mouth as needed for fever.    [provider]  albuterol (PROVENTIL HFA;VENTOLIN HFA) 108 (90 Base) MCG/ACT inhaler Inhale 1-2 puffs into the lungs every 6 (six) hours as needed for wheezing or shortness of breath. 09/07/15   Donnetta Hutching, MD  amLODipine (NORVASC) 10 MG tablet Take 1 tablet (10 mg total) by mouth daily.  09/09/19   Iran Ouch, MD  BRILINTA 60 MG TABS tablet Take 1 tablet (60 mg total) by mouth 2 (two) times daily. Please keep scheduled appointment for future refills. Thank you. 03/11/23   Iran Ouch, MD  candesartan (ATACAND) 16 MG tablet Take 1 tablet (16 mg total) by mouth daily. Please make overdue appt with Dr. Excell Seltzer before anymore refills. 1st attempt 03/13/20   Tonny Bollman, MD  carvedilol (COREG) 6.25 MG tablet TAKE 1 TABLET BY MOUTH TWICE DAILY WITH MEALS . APPOINTMENT REQUIRED FOR FUTURE REFILLS 11/17/22   Tonny Bollman, MD  cyclobenzaprine (FLEXERIL) 10 MG tablet Take 1 tablet (10 mg total) by mouth 3 (three) times daily as needed  for muscle spasms. 03/19/23   Shuford, French Ana, PA-C  fluticasone (FLONASE) 50 MCG/ACT nasal spray Place 1 spray into both nostrils at bedtime. 07/05/19   [provider]  guaiFENesin-dextromethorphan (ROBITUSSIN DM) 100-10 MG/5ML syrup Take 10 mLs by mouth every 4 (four) hours as needed for cough. 07/13/19   Leatha Gilding, MD  HYDROcodone-acetaminophen (NORCO/VICODIN) 5-325 MG tablet Take 1 tablet by mouth every 4 (four) hours as needed for moderate pain. 03/19/23 03/18/24  Shuford, French Ana, PA-C  omeprazole (PRILOSEC) 40 MG capsule Take 1 capsule (40 mg total) by mouth daily. Take daily before breakfast or lunch 09/01/18   Rachael Fee, MD  ondansetron (ZOFRAN) 4 MG tablet Take 1 tablet (4 mg total) by mouth every 8 (eight) hours as needed for nausea or vomiting. 03/19/23   Shuford, French Ana, PA-C  rosuvastatin (CRESTOR) 20 MG tablet Take 1 tablet (20 mg total) by mouth daily. 03/05/23   Iran Ouch, MD      Allergies    Lisinopril and Oxycodone    Review of Systems   Review of Systems Negative except as per HPI.  Physical Exam Updated Vital Signs BP (!) 160/124   Pulse 97   Temp 98.7 F (37.1 C) (Oral)   Resp 20   Wt 89.8 kg   SpO2 94%   BMI 32.95 kg/m  Physical Exam Vitals and nursing note reviewed.  Constitutional:      Appearance: Normal appearance.  HENT:     Head: Normocephalic and atraumatic.     Mouth/Throat:     Mouth: Mucous membranes are moist.  Eyes:     General: No scleral icterus. Cardiovascular:     Rate and Rhythm: Normal rate and regular rhythm.     Pulses: Normal pulses.     Heart sounds: Normal heart sounds.  Pulmonary:     Effort: Pulmonary effort is normal.     Breath sounds: Normal breath sounds.     Comments: Mild wheezing noted.  O2 sat 94% on 6 L. Abdominal:     General: Abdomen is flat.     Palpations: Abdomen is soft.     Tenderness: There is no abdominal tenderness.  Musculoskeletal:        General: No deformity.      Comments: Shoulder sling in place.  Skin:    General: Skin is warm.     Findings: No rash.  Neurological:     General: No focal deficit present.     Mental Status: She is alert.  Psychiatric:        Mood and Affect: Mood normal.     ED Results / Procedures / Treatments   Labs (all labs ordered are listed, but only abnormal results are displayed) Labs Reviewed  CBG MONITORING, ED - Abnormal; Notable  for the following components:      Result Value   Glucose-Capillary 140 (*)    All other components within normal limits  I-STAT CG4 LACTIC ACID, ED - Abnormal; Notable for the following components:   Lactic Acid, Venous 3.9 (*)    All other components within normal limits  RESP PANEL BY RT-PCR (RSV, FLU A&B, COVID)  RVPGX2  CBC WITH DIFFERENTIAL/PLATELET  COMPREHENSIVE METABOLIC PANEL  BRAIN NATRIURETIC PEPTIDE  D-DIMER, QUANTITATIVE  BLOOD GAS, VENOUS    EKG None  Radiology DG Shoulder Right  Result Date: 03/19/2023 CLINICAL DATA:  Elective surgery. EXAM: RIGHT SHOULDER - 2+ VIEW COMPARISON:  None Available. FINDINGS: Two fluoroscopic spot views of the right shoulder obtained in the operating room. Plate and screw fixation of the proximal humerus. Fluoroscopy time 44 seconds. Dose 4.856 mGy high IMPRESSION: Intraoperative fluoroscopy during right shoulder surgery. Electronically Signed   By: Narda Rutherford M.D.   On: 03/19/2023 17:05   DG C-Arm 1-60 Min-No Report  Result Date: 03/19/2023 Fluoroscopy was utilized by the requesting physician.  No radiographic interpretation.   DG C-Arm 1-60 Min-No Report  Result Date: 03/19/2023 Fluoroscopy was utilized by the requesting physician.  No radiographic interpretation.    Procedures Procedures    Medications Ordered in ED Medications - No data to display  ED Course/ Medical Decision Making/ A&P                                 Medical Decision Making Amount and/or Complexity of Data Reviewed Labs:  ordered. Radiology: ordered.  Risk Prescription drug management. Decision regarding hospitalization.   This patient presents to the ED for shortness of breath, confusion, this involves an extensive number of treatment options, and is a complaint that carries with a high risk of complications and morbidity.  The differential diagnosis includes pneumonia, pneumothorax, pleural effusion, PE, ACS/MI, NSTEMI, anesthesia induced hypoxia.  This is not an exhaustive list.  Lab tests: I ordered and personally interpreted labs.  The pertinent results include: WBC unremarkable. Hbg unremarkable. Platelets unremarkable. Electrolytes unremarkable. BUN, creatinine unremarkable. pO2 less than 31. D-dimer 0.79.  UA is unremarkable.  Viral panels are negative.  Imaging studies: I ordered imaging studies, personally reviewed, interpreted imaging and agree with the radiologist's interpretations. The results include: CT angio chest is negative for PE or pneumonia.  Problem list/ ED course/ Critical interventions/ Medical management: HPI: See above Vital signs significant for O2 sat 94% on 6.  Patient is not on oxygen at baseline.  Otherwise within normal range and stable throughout visit. Laboratory/imaging studies significant for: See above. On physical examination, patient is afebrile and appears in no acute distress.  Labs are nonrevealing.  CT angio chest negative for pneumonia and PE.  There was no focal neurological deficit on my examination.  Patient is ANO x 4. Do not think CT head is necessary at this point.  Confusion is likely secondary to anesthesia.  Patient will require admission due to increased O2 requirement. I have reviewed the patient home medicines and have made adjustments as needed.  Cardiac monitoring/EKG: The patient was maintained on a cardiac monitor.  I personally reviewed and interpreted the cardiac monitor which showed an underlying rhythm of: sinus rhythm.  Additional history  obtained: External records from outside source obtained and reviewed including: Chart review including previous notes, labs, imaging.  Consultations obtained: I spoke to Dr. Julian Reil [facility] hospitalist.  He agreed  to see the patient at bedside.  Disposition Admit.  This chart was dictated using voice recognition software.  Despite best efforts to proofread,  errors can occur which can change the documentation meaning.          Final Clinical Impression(s) / ED Diagnoses Final diagnoses:  Acute respiratory failure with hypoxia [facility])    Rx / DC Orders ED Discharge Orders     None         Jeanelle Malling, [place] 03/20/23 2141    Glynn Octave, MD 03/21/23 [phone]

## 2023-03-19 NOTE — Evaluation (Signed)
Occupational Therapy Evaluation Patient Details Name: Brenda [name] MRN: [id number] DOB: [birth year] Today's Date: 03/19/2023   History of Present Illness Brenda [name] is a 62 yr old female who is s/p a RUE ORIF on 03-19-23, due to a R displaced proximal humerus fracture.   Clinical Impression   The pt is s/p a an ORIF of her dominant RUE on 03-19-23. Therapist provided education and instruction to patient and her daughter with regards to ROM/exercises, post-op precautions, UE and sling positioning, donning upper extremity clothing, recommendations for bathing while maintaining UE precautions, use of ice for pain and edema management, use of ice machine, and correctly donning/doffing sling. Patient and her daughter verbalized and demonstrated as needed. Patient needed assistance to donn shirt, underwear, pants, and shoes with instruction on compensatory strategies to perform ADLs. Patient to follow up with MD for further therapy needs.        If plan is discharge home, recommend the following: A little help with bathing/dressing/bathroom;Assist for transportation;Assistance with cooking/housework    Functional Status Assessment  Patient has had a recent decline in their functional status and demonstrates the ability to make significant improvements in function in a reasonable and predictable amount of time.  Equipment Recommendations  Tub/shower seat    Recommendations for Other Services       Precautions / Restrictions Precautions Precaution Comments: sling at all times except ADLs/exercise Required Braces or Orthoses: Sling Restrictions Weight Bearing Restrictions: Yes RUE Weight Bearing: Non weight bearing Other Position/Activity Restrictions: PROM 10 ER, 45 ABD,60 FE , PASSIVE ROM FOR ADL's ONLY, NOT for EXERCISES. OK to exercise elbow wrist and hand rom and for edema control   No pendulums, may allow arm to dangle   Pt may shower      Mobility Bed Mobility        General  bed mobility comments: pt was received seated in chair    Transfers Overall transfer level: Needs assistance Equipment used: None Transfers: Sit to/from Stand Sit to Stand: Supervision                  Balance Overall balance assessment: No apparent balance deficits (not formally assessed)           ADL either performed or assessed with clinical judgement   ADL Overall ADL's : Needs assistance/impaired Eating/Feeding: Set up;Sitting   Grooming: Set up;Sitting           Upper Body Dressing : Moderate assistance;Sitting   Lower Body Dressing: Minimal assistance;Sit to/from stand                       Pertinent Vitals/Pain Pain Assessment Pain Assessment: 0-10 Pain Score: 4  Pain Location: chronic back pain Pain Intervention(s): Limited activity within patient's tolerance, Monitored during session        Communication Communication Communication: No apparent difficulties   Cognition Arousal: Alert Behavior During Therapy: WFL for tasks assessed/performed Overall Cognitive Status: Within Functional Limits for tasks assessed              Shoulder Instructions Shoulder Instructions Donning/doffing shirt without moving shoulder: Moderate assistance;Min-guard (with pt's daughter/caregiver performing teach back) Method for sponge bathing under operated UE: Caregiver independent with task Donning/doffing sling/immobilizer: Minimal assistance (with pt's daughter/caregiver performing teach back) Correct positioning of sling/immobilizer: Minimal assistance (with pt's daughter/caregiver performing teach back) Pendulum exercises (written home exercise program):  (N/A) ROM for elbow, wrist and digits of operated UE: Caregiver independent with task;Patient able to  independently direct caregiver Sling wearing schedule (on at all times/off for ADL's): Caregiver independent with task;Patient able to independently direct caregiver Proper positioning of operated  UE when showering: Caregiver independent with task Dressing change:  (N/A) Positioning of UE while sleeping: Caregiver independent with task    Home Living Family/patient expects to be discharged to:: Private residence Living Arrangements: Children (daughter) Available Help at Discharge: Family Type of Home: House Home Access: Stairs to enter Secretary/administrator of Steps: 1   Home Layout: Two level;1/2 bath on main level;Able to live on main level with bedroom/bathroom Alternate Level Stairs-Number of Steps: full bathroom upstairs; pt's bedroom is down stairs             Home Equipment: Rollator (4 wheels)          Prior Functioning/Environment Prior Level of Function : Independent/Modified Independent;Driving             Mobility Comments: She was independent with ambulation. ADLs Comments: She was independent with ADLs, cooking, cleaning, and driving.        OT Problem List: Decreased range of motion;Impaired UE functional use      OT Treatment/Interventions:   No further treatment needs identified       OT Frequency:  N/A       AM-PAC OT "6 Clicks" Daily Activity     Outcome Measure Help from another person eating meals?: None Help from another person taking care of personal grooming?: None Help from another person toileting, which includes using toliet, bedpan, or urinal?: A Little Help from another person bathing (including washing, rinsing, drying)?: A Little Help from another person to put on and taking off regular upper body clothing?: A Little Help from another person to put on and taking off regular lower body clothing?: A Little 6 Click Score: 20   End of Session Equipment Utilized During Treatment: Other (comment) (N/A) Nurse Communication: Other (comment) (post-op education completed)  Activity Tolerance: Patient tolerated treatment well Patient left: in chair;with call bell/phone within reach;with family/visitor present  OT Visit  Diagnosis: Muscle weakness (generalized) (M62.81)                Time: 2841-3244 OT Time Calculation (min): 27 min Charges:  OT General Charges $OT Visit: 1 Visit OT Evaluation $OT Eval Moderate Complexity: 1 Mod OT Treatments $Self Care/Home Management : 8-22 mins    Reuben Likes, OTR/L 03/19/2023, 5:35 PM

## 2023-03-20 ENCOUNTER — Inpatient Hospital Stay (HOSPITAL_COMMUNITY): Payer: Medicare Other

## 2023-03-20 ENCOUNTER — Encounter (HOSPITAL_COMMUNITY): Payer: Self-pay | Admitting: Orthopedic Surgery

## 2023-03-20 ENCOUNTER — Emergency Department (HOSPITAL_COMMUNITY): Payer: Medicare Other

## 2023-03-20 DIAGNOSIS — J449 Chronic obstructive pulmonary disease, unspecified: Secondary | ICD-10-CM | POA: Diagnosis present

## 2023-03-20 DIAGNOSIS — R0602 Shortness of breath: Secondary | ICD-10-CM

## 2023-03-20 DIAGNOSIS — Z87442 Personal history of urinary calculi: Secondary | ICD-10-CM | POA: Diagnosis not present

## 2023-03-20 DIAGNOSIS — J9601 Acute respiratory failure with hypoxia: Secondary | ICD-10-CM

## 2023-03-20 DIAGNOSIS — I10 Essential (primary) hypertension: Secondary | ICD-10-CM | POA: Diagnosis not present

## 2023-03-20 DIAGNOSIS — Z7902 Long term (current) use of antithrombotics/antiplatelets: Secondary | ICD-10-CM | POA: Diagnosis not present

## 2023-03-20 DIAGNOSIS — I708 Atherosclerosis of other arteries: Secondary | ICD-10-CM | POA: Diagnosis present

## 2023-03-20 DIAGNOSIS — Z1152 Encounter for screening for COVID-19: Secondary | ICD-10-CM | POA: Diagnosis not present

## 2023-03-20 DIAGNOSIS — S42291A Other displaced fracture of upper end of right humerus, initial encounter for closed fracture: Secondary | ICD-10-CM | POA: Diagnosis present

## 2023-03-20 DIAGNOSIS — R7303 Prediabetes: Secondary | ICD-10-CM | POA: Diagnosis present

## 2023-03-20 DIAGNOSIS — E669 Obesity, unspecified: Secondary | ICD-10-CM | POA: Diagnosis present

## 2023-03-20 DIAGNOSIS — E7800 Pure hypercholesterolemia, unspecified: Secondary | ICD-10-CM | POA: Diagnosis present

## 2023-03-20 DIAGNOSIS — M1990 Unspecified osteoarthritis, unspecified site: Secondary | ICD-10-CM | POA: Diagnosis present

## 2023-03-20 DIAGNOSIS — Z888 Allergy status to other drugs, medicaments and biological substances status: Secondary | ICD-10-CM | POA: Diagnosis not present

## 2023-03-20 DIAGNOSIS — Z87891 Personal history of nicotine dependence: Secondary | ICD-10-CM | POA: Diagnosis not present

## 2023-03-20 DIAGNOSIS — I110 Hypertensive heart disease with heart failure: Secondary | ICD-10-CM | POA: Diagnosis present

## 2023-03-20 DIAGNOSIS — Z83438 Family history of other disorder of lipoprotein metabolism and other lipidemia: Secondary | ICD-10-CM | POA: Diagnosis not present

## 2023-03-20 DIAGNOSIS — Z6832 Body mass index (BMI) 32.0-32.9, adult: Secondary | ICD-10-CM | POA: Diagnosis not present

## 2023-03-20 DIAGNOSIS — I5032 Chronic diastolic (congestive) heart failure: Secondary | ICD-10-CM

## 2023-03-20 DIAGNOSIS — I739 Peripheral vascular disease, unspecified: Secondary | ICD-10-CM | POA: Diagnosis present

## 2023-03-20 DIAGNOSIS — N390 Urinary tract infection, site not specified: Secondary | ICD-10-CM | POA: Diagnosis present

## 2023-03-20 DIAGNOSIS — Z955 Presence of coronary angioplasty implant and graft: Secondary | ICD-10-CM | POA: Diagnosis not present

## 2023-03-20 DIAGNOSIS — Z885 Allergy status to narcotic agent status: Secondary | ICD-10-CM | POA: Diagnosis not present

## 2023-03-20 DIAGNOSIS — Z8249 Family history of ischemic heart disease and other diseases of the circulatory system: Secondary | ICD-10-CM | POA: Diagnosis not present

## 2023-03-20 DIAGNOSIS — R4182 Altered mental status, unspecified: Secondary | ICD-10-CM | POA: Diagnosis present

## 2023-03-20 DIAGNOSIS — I2510 Atherosclerotic heart disease of native coronary artery without angina pectoris: Secondary | ICD-10-CM | POA: Diagnosis present

## 2023-03-20 DIAGNOSIS — Y929 Unspecified place or not applicable: Secondary | ICD-10-CM | POA: Diagnosis not present

## 2023-03-20 DIAGNOSIS — I959 Hypotension, unspecified: Secondary | ICD-10-CM | POA: Diagnosis present

## 2023-03-20 DIAGNOSIS — W1830XA Fall on same level, unspecified, initial encounter: Secondary | ICD-10-CM | POA: Diagnosis present

## 2023-03-20 LAB — ECHOCARDIOGRAM COMPLETE
AR max vel: 2.28 cm2
AV Area VTI: 2.33 cm2
AV Area mean vel: 2.2 cm2
AV Mean grad: 8 mm[Hg]
AV Peak grad: 13.2 mm[Hg]
Ao pk vel: 1.82 m/s
Area-P 1/2: 3.99 cm2
Calc EF: 72.7 %
Height: 65 in
MV VTI: 2.79 cm2
S' Lateral: 2.8 cm
Single Plane A2C EF: 75.4 %
Single Plane A4C EF: 71.8 %
Weight: 3168 [oz_av]

## 2023-03-20 LAB — CBC WITH DIFFERENTIAL/PLATELET
Abs Immature Granulocytes: 0.12 10*3/uL — ABNORMAL HIGH (ref 0.00–0.07)
Basophils Absolute: 0 10*3/uL (ref 0.0–0.1)
Basophils Relative: 0 %
Eosinophils Absolute: 0 10*3/uL (ref 0.0–0.5)
Eosinophils Relative: 0 %
HCT: 37.1 % (ref 36.0–46.0)
Hemoglobin: 11.3 g/dL — ABNORMAL LOW (ref 12.0–15.0)
Immature Granulocytes: 1 %
Lymphocytes Relative: 6 %
Lymphs Abs: 0.9 10*3/uL (ref 0.7–4.0)
MCH: 26.6 pg (ref 26.0–34.0)
MCHC: 30.5 g/dL (ref 30.0–36.0)
MCV: 87.3 fL (ref 80.0–100.0)
Monocytes Absolute: 0.2 10*3/uL (ref 0.1–1.0)
Monocytes Relative: 1 %
Neutro Abs: 13.2 10*3/uL — ABNORMAL HIGH (ref 1.7–7.7)
Neutrophils Relative %: 92 %
Platelets: 300 10*3/uL (ref 150–400)
RBC: 4.25 MIL/uL (ref 3.87–5.11)
RDW: 14.7 % (ref 11.5–15.5)
WBC: 14.5 10*3/uL — ABNORMAL HIGH (ref 4.0–10.5)
nRBC: 0 % (ref 0.0–0.2)

## 2023-03-20 LAB — BASIC METABOLIC PANEL
Anion gap: 13 (ref 5–15)
BUN: 12 mg/dL (ref 8–23)
CO2: 23 mmol/L (ref 22–32)
Calcium: 8.7 mg/dL — ABNORMAL LOW (ref 8.9–10.3)
Chloride: 101 mmol/L (ref 98–111)
Creatinine, Ser: 0.89 mg/dL (ref 0.44–1.00)
GFR, Estimated: >60 mL/min (ref >60)
Glucose, Bld: 132 mg/dL — ABNORMAL HIGH (ref 70–99)
Potassium: 3.8 mmol/L (ref 3.5–5.1)
Sodium: 137 mmol/L (ref 135–145)

## 2023-03-20 LAB — URINALYSIS, ROUTINE W REFLEX MICROSCOPIC
Bacteria, UA: NONE SEEN
Bilirubin Urine: NEGATIVE
Glucose, UA: NEGATIVE mg/dL
Hgb urine dipstick: NEGATIVE
Ketones, ur: NEGATIVE mg/dL
Nitrite: NEGATIVE
Protein, ur: NEGATIVE mg/dL
Specific Gravity, Urine: 1.025 (ref 1.005–1.030)
pH: 5 (ref 5.0–8.0)

## 2023-03-20 LAB — I-STAT CG4 LACTIC ACID, ED: Lactic Acid, Venous: 1.7 mmol/L (ref 0.5–1.9)

## 2023-03-20 LAB — HIV ANTIBODY (ROUTINE TESTING W REFLEX): HIV Screen 4th Generation wRfx: NONREACTIVE

## 2023-03-20 MED ORDER — TICAGRELOR 60 MG PO TABS
60.0000 mg | ORAL_TABLET | Freq: Two times a day (BID) | ORAL | Status: DC
  Administered 2023-03-20 – 2023-03-21 (×3): 60 mg via ORAL
  Filled 2023-03-20 (×4): qty 1

## 2023-03-20 MED ORDER — SODIUM CHLORIDE 0.9 % IV SOLN
1.0000 g | INTRAVENOUS | Status: DC
  Administered 2023-03-20 – 2023-03-21 (×2): 1 g via INTRAVENOUS
  Filled 2023-03-20 (×2): qty 10

## 2023-03-20 MED ORDER — ONDANSETRON HCL 4 MG PO TABS: 4.0000 mg | ORAL_TABLET | Freq: Four times a day (QID) | ORAL | Status: DC | PRN

## 2023-03-20 MED ORDER — ALBUTEROL SULFATE HFA 108 (90 BASE) MCG/ACT IN AERS: 1.0000 | INHALATION_SPRAY | Freq: Four times a day (QID) | RESPIRATORY_TRACT | Status: DC | PRN

## 2023-03-20 MED ORDER — ALBUTEROL SULFATE (2.5 MG/3ML) 0.083% IN NEBU: 2.5000 mg | INHALATION_SOLUTION | Freq: Four times a day (QID) | RESPIRATORY_TRACT | Status: DC | PRN

## 2023-03-20 MED ORDER — ENOXAPARIN SODIUM 40 MG/0.4ML IJ SOSY
40.0000 mg | PREFILLED_SYRINGE | INTRAMUSCULAR | Status: DC
  Administered 2023-03-20: 40 mg via SUBCUTANEOUS
  Filled 2023-03-20: qty 0.4

## 2023-03-20 MED ORDER — ONDANSETRON HCL 4 MG/2ML IJ SOLN: 4.0000 mg | Freq: Four times a day (QID) | INTRAMUSCULAR | Status: DC | PRN

## 2023-03-20 MED ORDER — ACETAMINOPHEN 325 MG PO TABS
650.0000 mg | ORAL_TABLET | Freq: Four times a day (QID) | ORAL | Status: DC | PRN
  Administered 2023-03-20 – 2023-03-21 (×2): 650 mg via ORAL
  Filled 2023-03-20 (×2): qty 2

## 2023-03-20 MED ORDER — LORATADINE 10 MG PO TABS
10.0000 mg | ORAL_TABLET | Freq: Every day | ORAL | Status: DC
  Administered 2023-03-20 – 2023-03-21 (×2): 10 mg via ORAL
  Filled 2023-03-20 (×2): qty 1

## 2023-03-20 MED ORDER — CYCLOBENZAPRINE HCL 10 MG PO TABS: 10.0000 mg | ORAL_TABLET | Freq: Three times a day (TID) | ORAL | Status: DC | PRN

## 2023-03-20 MED ORDER — IPRATROPIUM-ALBUTEROL 0.5-2.5 (3) MG/3ML IN SOLN
3.0000 mL | Freq: Once | RESPIRATORY_TRACT | Status: AC
  Administered 2023-03-20: 3 mL via RESPIRATORY_TRACT
  Filled 2023-03-20: qty 3

## 2023-03-20 MED ORDER — ROSUVASTATIN CALCIUM 10 MG PO TABS
20.0000 mg | ORAL_TABLET | Freq: Every day | ORAL | Status: DC
  Administered 2023-03-20 – 2023-03-21 (×2): 20 mg via ORAL
  Filled 2023-03-20 (×2): qty 2

## 2023-03-20 MED ORDER — HYDROCODONE-ACETAMINOPHEN 5-325 MG PO TABS
1.0000 | ORAL_TABLET | ORAL | Status: DC | PRN
  Administered 2023-03-21: 1 via ORAL
  Filled 2023-03-20: qty 1

## 2023-03-20 MED ORDER — ACETAMINOPHEN 650 MG RE SUPP: 650.0000 mg | Freq: Four times a day (QID) | RECTAL | Status: DC | PRN

## 2023-03-20 MED ORDER — PANTOPRAZOLE SODIUM 40 MG PO TBEC
40.0000 mg | DELAYED_RELEASE_TABLET | Freq: Every day | ORAL | Status: DC
  Administered 2023-03-20 – 2023-03-21 (×2): 40 mg via ORAL
  Filled 2023-03-20 (×2): qty 1

## 2023-03-20 MED ORDER — METHYLPREDNISOLONE SODIUM SUCC 125 MG IJ SOLR
125.0000 mg | Freq: Once | INTRAMUSCULAR | Status: AC
  Administered 2023-03-20: 125 mg via INTRAVENOUS
  Filled 2023-03-20: qty 2

## 2023-03-20 MED ORDER — SODIUM CHLORIDE 0.9 % IV BOLUS
1000.0000 mL | Freq: Once | INTRAVENOUS | Status: AC
  Administered 2023-03-20: 1000 mL via INTRAVENOUS

## 2023-03-20 NOTE — ED Notes (Signed)
ED TO INPATIENT HANDOFF REPORT  Name/Age/Gender Brenda [name] 62 y.o. female  Code Status    Code Status Orders  (From admission, onward)           Start     Ordered   03/20/23 0459  Full code  Continuous       Question:  By:  Answer:  Default: patient does not have capacity for decision making, no surrogate or prior directive available   03/20/23 0505           Code Status History     Date Active Date Inactive Code Status Order ID Comments User Context   07/09/2019 1138 07/13/2019 1719 Full Code [id number]  Jonah Blue, MD ED   10/31/2014 0634 11/02/2014 1601 Full Code [id number]  Pearson Grippe, MD Inpatient       Home/SNF/Other Home  Chief Complaint Acute respiratory failure with hypoxia (HCC) [J96.01]  Level of Care/Admitting Diagnosis ED Disposition     ED Disposition  Admit   Condition  --   Comment  Hospital Area: [facility] [[id number]]  Level of Care: Med-Surg [16]  May place patient in observation at [facility] or [facility] if equivalent level of care is available:: No  Covid Evaluation: Confirmed COVID Negative  Diagnosis: Acute respiratory failure with hypoxia [facility]) [[id number]]  Admitting Physician: Hillary Bow [id number]  Attending Physician: Hillary Bow [phone]          Medical History Past Medical History:  Diagnosis Date   Anemia    Arthritis    CAD (coronary artery disease)    Cardiomyopathy    Carotid artery occlusion    CHF (congestive heart failure) (HCC) 2007   COPD (chronic obstructive pulmonary disease) (HCC)    not on home o2   History of kidney stones    HTN (hypertension)    Hypercholesteremia    Pre-diabetes    Pulmonary nodule    PVD (peripheral vascular disease) (HCC)    Renal artery stenosis (HCC)     Allergies Allergies  Allergen Reactions   Lisinopril Cough   Oxycodone Nausea And Vomiting    IV Location/Drains/Wounds Patient Lines/Drains/Airways Status     Active  Line/Drains/Airways     Name Placement date Placement time Site Days   Peripheral IV 03/19/23 20 G Left;Posterior Hand 03/19/23  2150  Hand  1   Peripheral IV 03/19/23 20 G Anterior;Left;Proximal Forearm 03/19/23  2130  Forearm  1   Incision - 3 Ports Abdomen Umbilicus Lateral;Right Medial 03/01/19  0824  -- 1480            Labs/Imaging Results for orders placed or performed during the hospital encounter of 03/19/23 (from the past 48 hour(s))  CBG monitoring, ED     Status: Abnormal   Collection Time: 03/19/23  9:39 PM  Result Value Ref Range   Glucose-Capillary 140 (H) 70 - 99 mg/dL    Comment: Glucose reference range applies only to samples taken after fasting for at least 8 hours.  CBC with Differential     Status: Abnormal   Collection Time: 03/19/23  9:40 PM  Result Value Ref Range   WBC 16.5 (H) 4.0 - 10.5 K/uL   RBC 4.42 3.87 - 5.11 MIL/uL   Hemoglobin 12.0 12.0 - 15.0 g/dL   HCT 84.1 32.4 - 40.1 %   MCV 90.5 80.0 - 100.0 fL   MCH 27.1 26.0 - 34.0 pg   MCHC 30.0 30.0 - 36.0 g/dL  RDW 14.7 11.5 - 15.5 %   Platelets 341 150 - 400 K/uL   nRBC 0.0 0.0 - 0.2 %   Neutrophils Relative % 92 %   Neutro Abs 15.2 (H) 1.7 - 7.7 K/uL   Lymphocytes Relative 6 %   Lymphs Abs 1.0 0.7 - 4.0 K/uL   Monocytes Relative 1 %   Monocytes Absolute 0.1 0.1 - 1.0 K/uL   Eosinophils Relative 0 %   Eosinophils Absolute 0.0 0.0 - 0.5 K/uL   Basophils Relative 0 %   Basophils Absolute 0.1 0.0 - 0.1 K/uL   Immature Granulocytes 1 %   Abs Immature Granulocytes 0.13 (H) 0.00 - 0.07 K/uL    Comment: Performed at [facility], [address] [place], [place] [address]  Comprehensive metabolic panel     Status: Abnormal   Collection Time: 03/19/23  9:40 PM  Result Value Ref Range   Sodium 136 135 - 145 mmol/L   Potassium 5.1 3.5 - 5.1 mmol/L    Comment: HEMOLYSIS AT THIS LEVEL MAY AFFECT RESULT   Chloride 105 98 - 111 mmol/L   CO2 20 (L) 22 - 32 mmol/L   Glucose, Bld 138  (H) 70 - 99 mg/dL    Comment: Glucose reference range applies only to samples taken after fasting for at least 8 hours.   BUN 13 8 - 23 mg/dL   Creatinine, Ser 0.25 0.44 - 1.00 mg/dL   Calcium 8.6 (L) 8.9 - 10.3 mg/dL   Total Protein 7.2 6.5 - 8.1 g/dL   Albumin 3.6 3.5 - 5.0 g/dL   AST 29 15 - 41 U/L    Comment: HEMOLYSIS AT THIS LEVEL MAY AFFECT RESULT   ALT 13 0 - 44 U/L    Comment: HEMOLYSIS AT THIS LEVEL MAY AFFECT RESULT   Alkaline Phosphatase 77 38 - 126 U/L   Total Bilirubin 0.8 0.3 - 1.2 mg/dL    Comment: HEMOLYSIS AT THIS LEVEL MAY AFFECT RESULT   GFR, Estimated >60 >60 mL/min    Comment: (NOTE) Calculated using the CKD-EPI Creatinine Equation (2021)    Anion gap 11 5 - 15    Comment: Performed at [facility], [address], [place], [place] [address]  Brain natriuretic peptide     Status: None   Collection Time: 03/19/23  9:40 PM  Result Value Ref Range   B Natriuretic Peptide 65.6 0.0 - 100.0 pg/mL    Comment: Performed at [facility], [address] [place], [place] [address]  D-dimer, quantitative     Status: Abnormal   Collection Time: 03/19/23  9:40 PM  Result Value Ref Range   D-Dimer, Quant 0.79 (H) 0.00 - 0.50 ug/mL-FEU    Comment: (NOTE) At the manufacturer cut-off value of 0.5 g/mL FEU, this assay has a negative predictive value of 95-100%.This assay is intended for use in conjunction with a clinical pretest probability (PTP) assessment model to exclude pulmonary embolism (PE) and deep venous thrombosis (DVT) in outpatients suspected of PE or DVT. Results should be correlated with clinical presentation. Performed at [facility], [address] [place], [place] [address]   I-Stat CG4 Lactic Acid     Status: Abnormal   Collection Time: 03/19/23  9:57 PM  Result Value Ref Range   Lactic Acid, Venous 3.9 (HH) 0.5 - 1.9 mmol/L   Comment NOTIFIED PHYSICIAN   Resp panel by RT-PCR (RSV, Flu A&B,  Covid) Anterior Nasal Swab  Status: None   Collection Time: 03/19/23 10:28 PM   Specimen: Anterior Nasal Swab  Result Value Ref Range   SARS Coronavirus 2 by RT PCR NEGATIVE NEGATIVE    Comment: (NOTE) SARS-CoV-2 target nucleic acids are NOT DETECTED.  The SARS-CoV-2 RNA is generally detectable in upper respiratory specimens during the acute phase of infection. The lowest concentration of SARS-CoV-2 viral copies this assay can detect is 138 copies/mL. A negative result does not preclude SARS-Cov-2 infection and should not be used as the sole basis for treatment or other patient management decisions. A negative result may occur with  improper specimen collection/handling, submission of specimen other than nasopharyngeal swab, presence of viral mutation(s) within the areas targeted by this assay, and inadequate number of viral copies(<138 copies/mL). A negative result must be combined with clinical observations, patient history, and epidemiological information. The expected result is Negative.  Fact Sheet for Patients:  BloggerCourse.com  Fact Sheet for Healthcare Providers:  SeriousBroker.it  This test is no t yet approved or cleared by the [place] FDA and  has been authorized for detection and/or diagnosis of SARS-CoV-2 by FDA under an Emergency Use Authorization (EUA). This EUA will remain  in effect (meaning this test can be used) for the duration of the COVID-19 declaration under Section 564(b)(1) of the Act, 21 U.S.C.section 360bbb-3(b)(1), unless the authorization is terminated  or revoked sooner.       Influenza A by PCR NEGATIVE NEGATIVE   Influenza B by PCR NEGATIVE NEGATIVE    Comment: (NOTE) The Xpert Xpress SARS-CoV-2/FLU/RSV plus assay is intended as an aid in the diagnosis of influenza from Nasopharyngeal swab specimens and should not be used as a sole basis for treatment. Nasal washings and aspirates  are unacceptable for Xpert Xpress SARS-CoV-2/FLU/RSV testing.  Fact Sheet for Patients: BloggerCourse.com  Fact Sheet for Healthcare Providers: SeriousBroker.it  This test is not yet approved or cleared by the [place] FDA and has been authorized for detection and/or diagnosis of SARS-CoV-2 by FDA under an Emergency Use Authorization (EUA). This EUA will remain in effect (meaning this test can be used) for the duration of the COVID-19 declaration under Section 564(b)(1) of the Act, 21 U.S.C. section 360bbb-3(b)(1), unless the authorization is terminated or revoked.     Resp Syncytial Virus by PCR NEGATIVE NEGATIVE    Comment: (NOTE) Fact Sheet for Patients: BloggerCourse.com  Fact Sheet for Healthcare Providers: SeriousBroker.it  This test is not yet approved or cleared by the [place] FDA and has been authorized for detection and/or diagnosis of SARS-CoV-2 by FDA under an Emergency Use Authorization (EUA). This EUA will remain in effect (meaning this test can be used) for the duration of the COVID-19 declaration under Section 564(b)(1) of the Act, 21 U.S.C. section 360bbb-3(b)(1), unless the authorization is terminated or revoked.  Performed at [facility], [address], [place], [place] [address]   Blood gas, venous (at [facility])     Status: Abnormal   Collection Time: 03/19/23 10:29 PM  Result Value Ref Range   pH, Ven 7.29 7.25 - 7.43   pCO2, Ven 53 44 - 60 mmHg   pO2, Ven <31 (LL) 32 - 45 mmHg    Comment: CRITICAL RESULT CALLED TO, READ BACK BY AND VERIFIED WITH: RIVERS,TJ RN @ 2305 03/19/23 BY CHILDRESS,E    Bicarbonate 25.5 20.0 - 28.0 mmol/L   Acid-base deficit 1.9 0.0 - 2.0 mmol/L   O2 Saturation 35.5 %   Patient temperature  37.0     Comment: Performed at [facility], [address], [place], [place] [address]   Urinalysis, Routine w reflex microscopic -Urine, Clean Catch     Status: Abnormal   Collection Time: 03/19/23 10:37 PM  Result Value Ref Range   Color, Urine STRAW (A) YELLOW   APPearance CLEAR CLEAR   Specific Gravity, Urine 1.025 1.005 - 1.030   pH 5.0 5.0 - 8.0   Glucose, UA NEGATIVE NEGATIVE mg/dL   Hgb urine dipstick NEGATIVE NEGATIVE   Bilirubin Urine NEGATIVE NEGATIVE   Ketones, ur NEGATIVE NEGATIVE mg/dL   Protein, ur NEGATIVE NEGATIVE mg/dL   Nitrite NEGATIVE NEGATIVE   Leukocytes,Ua SMALL (A) NEGATIVE   RBC / HPF 0-5 0 - 5 RBC/hpf   WBC, UA 11-20 0 - 5 WBC/hpf   Bacteria, UA NONE SEEN NONE SEEN   Squamous Epithelial / HPF 0-5 0 - 5 /HPF   Mucus PRESENT     Comment: Performed at [facility], [address], [place], [place] [address]  I-Stat CG4 Lactic Acid     Status: None   Collection Time: 03/20/23 12:14 AM  Result Value Ref Range   Lactic Acid, Venous 1.7 0.5 - 1.9 mmol/L  Blood gas, arterial (at [facility])     Status: Abnormal (Preliminary result)   Collection Time: 03/20/23  3:30 AM  Result Value Ref Range   Delivery systems PENDING    pH, Arterial 7.41 7.35 - 7.45   pCO2 arterial 37 32 - 48 mmHg   pO2, Arterial 72 (L) 83 - 108 mmHg   Bicarbonate 23.5 20.0 - 28.0 mmol/L   Acid-base deficit 0.8 0.0 - 2.0 mmol/L   O2 Saturation 97.9 %   Patient temperature 36.9    Collection site LEFT RADIAL    Allens test (pass/fail) PASS PASS    Comment: Performed at [facility], [address], [place], [place] [address]   CT Angio Chest PE W and/or Wo Contrast  Result Date: 03/20/2023 CLINICAL DATA:  Hypoxia EXAM: CT ANGIOGRAPHY CHEST WITH CONTRAST TECHNIQUE: Multidetector CT imaging of the chest was performed using the standard protocol during bolus administration of intravenous contrast. Multiplanar CT image reconstructions and MIPs were obtained to evaluate the vascular anatomy. RADIATION DOSE REDUCTION: This exam was performed  according to the departmental dose-optimization program which includes automated exposure control, adjustment of the mA and/or kV according to patient size and/or use of iterative reconstruction technique. CONTRAST:  80mL OMNIPAQUE IOHEXOL 350 MG/ML SOLN COMPARISON:  None Available. FINDINGS: Cardiovascular: There is adequate opacification of the pulmonary arterial tree. No intraluminal filling defect identified to suggest acute pulmonary embolism. Central pulmonary arteries are enlarged in keeping with changes of pulmonary arterial hypertension. Moderate multi-vessel coronary artery calcification. Cardiac size is within normal limits. No pericardial effusion. Moderate atherosclerotic calcification within the thoracic aorta. No aortic aneurysm. Mediastinum/Nodes: No enlarged mediastinal, hilar, or axillary lymph nodes. Thyroid gland, trachea, and esophagus demonstrate no significant findings. Lungs/Pleura: Moderate emphysema. No confluent pulmonary infiltrate. No pneumothorax or pleural effusion. No central obstructing lesion. Upper Abdomen: No acute abnormality. Musculoskeletal: Right proximal humeral ORIF has been performed. Subcutaneous gas within the right shoulder and anterior chest wall are postsurgical in nature. No acute bone abnormality. Review of the MIP images confirms the above findings. IMPRESSION: 1. No pulmonary embolism. No acute intrathoracic pathology identified. 2. Moderate multi-vessel coronary artery calcification. 3. Morphologic changes in keeping with pulmonary arterial hypertension. Aortic Atherosclerosis (ICD10-I70.0) and Emphysema (  ICD10-J43.9). Electronically Signed   By: Helyn Numbers M.D.   On: 03/20/2023 00:26   DG Shoulder Right  Result Date: 03/19/2023 CLINICAL DATA:  Elective surgery. EXAM: RIGHT SHOULDER - 2+ VIEW COMPARISON:  None Available. FINDINGS: Two fluoroscopic spot views of the right shoulder obtained in the operating room. Plate and screw fixation of the proximal  humerus. Fluoroscopy time 44 seconds. Dose 4.856 mGy high IMPRESSION: Intraoperative fluoroscopy during right shoulder surgery. Electronically Signed   By: Narda Rutherford M.D.   On: 03/19/2023 17:05   DG C-Arm 1-60 Min-No Report  Result Date: 03/19/2023 Fluoroscopy was utilized by the requesting physician.  No radiographic interpretation.   DG C-Arm 1-60 Min-No Report  Result Date: 03/19/2023 Fluoroscopy was utilized by the requesting physician.  No radiographic interpretation.    Pending Labs Unresulted Labs (From admission, onward)     Start     Ordered   03/20/23 0506  CBC with Differential/Platelet  Once,   R        03/20/23 0505   03/20/23 0506  Basic metabolic panel  Once,   R        03/20/23 0505   03/20/23 0458  HIV Antibody (routine testing w rflx)  (HIV Antibody (Routine testing w reflex) panel)  Once,   R        03/20/23 0505            Vitals/Pain Today's Vitals   03/20/23 0045 03/20/23 0115 03/20/23 0133 03/20/23 0230  BP: (!) 121/57 113/74 106/65 105/68  Pulse: 94 93 88 87  Resp: 19 20 (!) 23 16  Temp:   98.5 F (36.9 C)   TempSrc:   Oral   SpO2:  91% 91% 91%  Weight:      PainSc:   0-No pain     Isolation Precautions No active isolations  Medications Medications  enoxaparin (LOVENOX) injection 40 mg (has no administration in time range)  acetaminophen (TYLENOL) tablet 650 mg (has no administration in time range)    Or  acetaminophen (TYLENOL) suppository 650 mg (has no administration in time range)  ondansetron (ZOFRAN) tablet 4 mg (has no administration in time range)    Or  ondansetron (ZOFRAN) injection 4 mg (has no administration in time range)  albuterol (PROVENTIL) (2.5 MG/3ML) 0.083% nebulizer solution 2.5 mg (has no administration in time range)  sodium chloride 0.9 % bolus 1,000 mL (0 mLs Intravenous Stopped 03/20/23 0020)  ipratropium-albuterol (DUONEB) 0.5-2.5 (3) MG/3ML nebulizer solution 3 mL (3 mLs Nebulization Given 03/20/23  0004)  iohexol (OMNIPAQUE) 350 MG/ML injection 80 mL (80 mLs Intravenous Contrast Given 03/19/23 2314)  ipratropium-albuterol (DUONEB) 0.5-2.5 (3) MG/3ML nebulizer solution 3 mL (3 mLs Nebulization Given 03/20/23 0339)  methylPREDNISolone sodium succinate (SOLU-MEDROL) 125 mg/2 mL injection 125 mg (125 mg Intravenous Given 03/20/23 0347)    Mobility walks with person assist

## 2023-03-20 NOTE — Progress Notes (Signed)
  Echocardiogram 2D Echocardiogram has been performed.  Brenda [name] 03/20/2023, 3:03 PM

## 2023-03-20 NOTE — Plan of Care (Signed)
  Problem: Education: Goal: Knowledge of the prescribed therapeutic regimen will improve Outcome: Progressing Goal: Understanding of activity limitations/precautions following surgery will improve Outcome: Progressing   Problem: Activity: Goal: Ability to tolerate increased activity will improve Outcome: Progressing   Problem: Pain Management: Goal: Pain level will decrease with appropriate interventions Outcome: Progressing   Problem: Education: Goal: Knowledge of General Education information will improve Description: Including pain rating scale, medication(s)/side effects and non-pharmacologic comfort measures Outcome: Progressing   Problem: Clinical Measurements: Goal: Will remain free from infection Outcome: Progressing Goal: Respiratory complications will improve Outcome: Progressing Goal: Cardiovascular complication will be avoided Outcome: Progressing   Problem: Activity: Goal: Risk for activity intolerance will decrease Outcome: Progressing   Problem: Nutrition: Goal: Adequate nutrition will be maintained Outcome: Progressing   Problem: Coping: Goal: Level of anxiety will decrease Outcome: Progressing   Problem: Elimination: Goal: Will not experience complications related to urinary retention Outcome: Progressing

## 2023-03-20 NOTE — Progress Notes (Signed)
Patient admitted earlier this morning.  H&P reviewed.  Patient seen and examined.  Patient feels well.  Denies any chest pain shortness of breath.  No nausea or vomiting.  Her sister is at the bedside.  Vital signs reviewed.  Saturations are in the early 90s on 10 L by nasal cannula.  Lungs are clear to auscultation bilaterally with somewhat diminished air entry at the bases. S1-S2 is normal regular Abdomen is soft. Right arm is in a sling.  Labs reviewed.  Elevated WBC noted.  CT angiogram did not show any PE.  COVID testing and influenza testing was negative.  Patient presented after being noted to be hypoxic at home.  She had undergone ORIF of the right humerus on the morning of admission.  Patient mentioned that she had similar trouble after her previous surgery that required anesthesia.  She does have underlying COPD and along with anesthesia may have had very poor effort resulting in her hypoxia.  She is still requiring a lot of oxygen though.   CT angiogram negative for PE or any opacities.  No obvious infection noted.    Will proceed with echocardiogram.    Incentive spirometry.  Mobilization.     She like he has some element of chronic hypoxia and her sats need not be greater than 89%.  Wean down oxygen.    She does have chronic diastolic CHF but there is no evidence to suggest volume overload.  Continue to monitor for now.  She does mention frequent urination and is concerned about urinary tract infection.  UA is noted to be abnormal.  Will place her on ceftriaxone.  Follow-up on urine culture.  Other issues as per H&P.  Osvaldo Shipper 03/20/2023

## 2023-03-20 NOTE — Assessment & Plan Note (Addendum)
No pulm edema, peripheral edema, or other symptoms to suggest that this is in decompensation.

## 2023-03-20 NOTE — Assessment & Plan Note (Addendum)
Persistent new oxygen requirement following anesthesia and surgery yesterday of unclear cause. Worth noting that pt has h/o same thing happening previously after anesthesia, had to be sent home on oxygen at that time. CTA chest neg for PNA, CHF findings, and PE. Pt completely asymptomatic on oxygen, no wheezing to suggest COPD exacerbation. From a neuro standpoint, pt is AAOx4, wide awake, which is actually quite impressive for 0500. At this point admitting pt for O2 and cont pulse ox Watch for development of other symptoms which could suggest another cause. Day team may want to curbside anesthesia this AM to see if they have any ideas what might be causing her persistent new O2 requirement.

## 2023-03-20 NOTE — H&P (Signed)
History and Physical    Patient: Brenda [name]:[id number] DOB: 09/03/1960 DOA: 03/19/2023 DOS: the patient was seen and examined on 03/20/2023 PCP: [facility]  Patient coming from: Home  Chief Complaint:  Chief Complaint  Patient presents with   Altered Mental Status   HPI: Brenda [name] is a 62 y.o. female with medical history significant of CAD, CHF, COPD, HTN.  Pt not on home O2 at baseline.  Pt had proximal humeral fixation this AM.  Discharged home in afternoon.  Per family at bedside patient's daughter was trying to wake the patient up at home but she was nonresponsive for 2 or 3 minutes and just had a blank stare.  Pt then returned to normal self.  EMS called, pt found to be hypoxic by EMS dropping to mid 80s on RA.  Started on 6L.  In ED pt is completely asymptomatic, denies SOB, wheezing, cough, she is AAOx4 and wide awake but has persistent 5-6L O2 requirement which is new.  ED work up, though reasonably extensive, is unrevealing as to the cause of her new O2 requirement.    Review of Systems: As mentioned in the history of present illness. All other systems reviewed and are negative. Past Medical History:  Diagnosis Date   Anemia    Arthritis    CAD (coronary artery disease)    Cardiomyopathy    Carotid artery occlusion    CHF (congestive heart failure) (HCC) 2007   COPD (chronic obstructive pulmonary disease) (HCC)    not on home o2   History of kidney stones    HTN (hypertension)    Hypercholesteremia    Pre-diabetes    Pulmonary nodule    PVD (peripheral vascular disease) (HCC)    Renal artery stenosis (HCC)    Past Surgical History:  Procedure Laterality Date   ABDOMINAL AORTOGRAM W/LOWER EXTREMITY N/A 05/11/2019   Procedure: ABDOMINAL AORTOGRAM W/LOWER EXTREMITY;  Surgeon: Iran Ouch, MD;  Location: [facility];  Service: Cardiovascular;  Laterality: N/A;   CARDIAC CATHETERIZATION     2007    CHOLECYSTECTOMY N/A 03/01/2019   Procedure: LAPAROSCOPIC CHOLECYSTECTOMY;  Surgeon: Axel Filler, MD;  Location: [facility] OR;  Service: General;  Laterality: N/A;   CORONARY STENT PLACEMENT      Successful stenting of the right renal ostium   complicated by distal wire perforation.  The wire perforation was  treated with embolization of the branch vessels involved.  Will check CT   scan to assess the right renal parenchymal hematoma.  She will be   maintained on both aspirin and Plavix.  Will place a Foley catheter to   avoid clotting since hematuria is likely.     EXTERNAL EAR SURGERY     as a child   PERIPHERAL VASCULAR INTERVENTION  05/11/2019   Procedure: PERIPHERAL VASCULAR INTERVENTION;  Surgeon: Iran Ouch, MD;  Location: [facility];  Service: Cardiovascular;;  Bilater Iliac   Social History:  reports that she quit smoking about 17 years ago. Her smoking use included cigarettes. She started smoking about 48 years ago. She has a 31 pack-year smoking history. She has never used smokeless tobacco. She reports that she does not drink alcohol and does not use drugs.  Allergies  Allergen Reactions   Lisinopril Cough   Oxycodone Nausea And Vomiting    Family History  Problem Relation Age of Onset   Heart disease Mother 24  Heart Disease before age 74   Hypertension Mother    Hyperlipidemia Mother    Peripheral vascular disease Mother    Cancer Father    Hyperlipidemia Father    Hypertension Father    Coronary artery disease Other     Prior to Admission medications   Medication Sig Start Date End Date Taking? Authorizing Provider  Acetaminophen 500 MG capsule Take 1 capsule by mouth as needed for fever.    [provider]  albuterol (PROVENTIL HFA;VENTOLIN HFA) 108 (90 Base) MCG/ACT inhaler Inhale 1-2 puffs into the lungs every 6 (six) hours as needed for wheezing or shortness of breath. 09/07/15   Donnetta Hutching, MD  amLODipine (NORVASC) 10 MG tablet Take 1  tablet (10 mg total) by mouth daily. 09/09/19   Iran Ouch, MD  BRILINTA 60 MG TABS tablet Take 1 tablet (60 mg total) by mouth 2 (two) times daily. Please keep scheduled appointment for future refills. Thank you. 03/11/23   Iran Ouch, MD  candesartan (ATACAND) 16 MG tablet Take 1 tablet (16 mg total) by mouth daily. Please make overdue appt with Dr. Excell Seltzer before anymore refills. 1st attempt 03/13/20   Tonny Bollman, MD  carvedilol (COREG) 6.25 MG tablet TAKE 1 TABLET BY MOUTH TWICE DAILY WITH MEALS . APPOINTMENT REQUIRED FOR FUTURE REFILLS 11/17/22   Tonny Bollman, MD  cyclobenzaprine (FLEXERIL) 10 MG tablet Take 1 tablet (10 mg total) by mouth 3 (three) times daily as needed for muscle spasms. 03/19/23   Shuford, French Ana, PA-C  fluticasone (FLONASE) 50 MCG/ACT nasal spray Place 1 spray into both nostrils at bedtime. 07/05/19   [provider]  guaiFENesin-dextromethorphan (ROBITUSSIN DM) 100-10 MG/5ML syrup Take 10 mLs by mouth every 4 (four) hours as needed for cough. 07/13/19   Leatha Gilding, MD  HYDROcodone-acetaminophen (NORCO/VICODIN) 5-325 MG tablet Take 1 tablet by mouth every 4 (four) hours as needed for moderate pain. 03/19/23 03/18/24  Shuford, French Ana, PA-C  omeprazole (PRILOSEC) 40 MG capsule Take 1 capsule (40 mg total) by mouth daily. Take daily before breakfast or lunch 09/01/18   Rachael Fee, MD  ondansetron (ZOFRAN) 4 MG tablet Take 1 tablet (4 mg total) by mouth every 8 (eight) hours as needed for nausea or vomiting. 03/19/23   Shuford, French Ana, PA-C  rosuvastatin (CRESTOR) 20 MG tablet Take 1 tablet (20 mg total) by mouth daily. 03/05/23   Iran Ouch, MD    Physical Exam: Vitals:   03/20/23 0045 03/20/23 0115 03/20/23 0133 03/20/23 0230  BP: (!) 121/57 113/74 106/65 105/68  Pulse: 94 93 88 87  Resp: 19 20 (!) 23 16  Temp:   98.5 F (36.9 C)   TempSrc:   Oral   SpO2:  91% 91% 91%  Weight:       Constitutional: NAD, calm,  comfortable Respiratory: clear to auscultation bilaterally, no wheezing, no crackles. Normal respiratory effort. No accessory muscle use.  Cardiovascular: Regular rate and rhythm, no murmurs / rubs / gallops. No extremity edema. 2+ pedal pulses. No carotid bruits.  Abdomen: no tenderness, no masses palpated. No hepatosplenomegaly. Bowel sounds positive.  Musculoskeletal: RUE in sling Neurologic: CN 2-12 grossly intact. Sensation intact, DTR normal. Strength 5/5 in all 4.  Psychiatric: Normal judgment and insight. Alert and oriented x 3. Normal mood.   Data Reviewed:    Labs on Admission: I have personally reviewed following labs and imaging studies  CBC: Recent Labs  Lab 03/19/23 1015 03/19/23 2140  WBC 15.7* 16.5*  NEUTROABS  --  15.2*  HGB 13.0 12.0  HCT 43.3 40.0  MCV 89.8 90.5  PLT 340 341   Basic Metabolic Panel: Recent Labs  Lab 03/19/23 1015 03/19/23 2140  NA 139 136  K 3.7 5.1  CL 105 105  CO2 25 20*  GLUCOSE 109* 138*  BUN 14 13  CREATININE 0.99 0.99  CALCIUM 9.1 8.6*   GFR: Estimated Creatinine Clearance: 66 mL/min (by C-G formula based on SCr of 0.99 mg/dL). Liver Function Tests: Recent Labs  Lab 03/19/23 2140  AST 29  ALT 13  ALKPHOS 77  BILITOT 0.8  PROT 7.2  ALBUMIN 3.6   No results for input(s): "LIPASE", "AMYLASE" in the last 168 hours. No results for input(s): "AMMONIA" in the last 168 hours. Coagulation Profile: No results for input(s): "INR", "PROTIME" in the last 168 hours. Cardiac Enzymes: No results for input(s): "CKTOTAL", "CKMB", "CKMBINDEX", "TROPONINI" in the last 168 hours. BNP (last 3 results) No results for input(s): "PROBNP" in the last 8760 hours. HbA1C: No results for input(s): "HGBA1C" in the last 72 hours. CBG: Recent Labs  Lab 03/19/23 2139  GLUCAP 140*   Lipid Profile: No results for input(s): "CHOL", "HDL", "LDLCALC", "TRIG", "CHOLHDL", "LDLDIRECT" in the last 72 hours. Thyroid Function Tests: No results for  input(s): "TSH", "T4TOTAL", "FREET4", "T3FREE", "THYROIDAB" in the last 72 hours. Anemia Panel: No results for input(s): "VITAMINB12", "FOLATE", "FERRITIN", "TIBC", "IRON", "RETICCTPCT" in the last 72 hours. Urine analysis:    Component Value Date/Time   COLORURINE STRAW (A) 03/19/2023 2237   APPEARANCEUR CLEAR 03/19/2023 2237   LABSPEC 1.025 03/19/2023 2237   PHURINE 5.0 03/19/2023 2237   GLUCOSEU NEGATIVE 03/19/2023 2237   GLUCOSEU NEGATIVE 01/05/2007 1248   HGBUR NEGATIVE 03/19/2023 2237   BILIRUBINUR NEGATIVE 03/19/2023 2237   KETONESUR NEGATIVE 03/19/2023 2237   PROTEINUR NEGATIVE 03/19/2023 2237   UROBILINOGEN 1.0 10/31/2014 0844   NITRITE NEGATIVE 03/19/2023 2237   LEUKOCYTESUR SMALL (A) 03/19/2023 2237    Radiological Exams on Admission: CT Angio Chest PE W and/or Wo Contrast  Result Date: 03/20/2023 CLINICAL DATA:  Hypoxia EXAM: CT ANGIOGRAPHY CHEST WITH CONTRAST TECHNIQUE: Multidetector CT imaging of the chest was performed using the standard protocol during bolus administration of intravenous contrast. Multiplanar CT image reconstructions and MIPs were obtained to evaluate the vascular anatomy. RADIATION DOSE REDUCTION: This exam was performed according to the departmental dose-optimization program which includes automated exposure control, adjustment of the mA and/or kV according to patient size and/or use of iterative reconstruction technique. CONTRAST:  80mL OMNIPAQUE IOHEXOL 350 MG/ML SOLN COMPARISON:  None Available. FINDINGS: Cardiovascular: There is adequate opacification of the pulmonary arterial tree. No intraluminal filling defect identified to suggest acute pulmonary embolism. Central pulmonary arteries are enlarged in keeping with changes of pulmonary arterial hypertension. Moderate multi-vessel coronary artery calcification. Cardiac size is within normal limits. No pericardial effusion. Moderate atherosclerotic calcification within the thoracic aorta. No aortic  aneurysm. Mediastinum/Nodes: No enlarged mediastinal, hilar, or axillary lymph nodes. Thyroid gland, trachea, and esophagus demonstrate no significant findings. Lungs/Pleura: Moderate emphysema. No confluent pulmonary infiltrate. No pneumothorax or pleural effusion. No central obstructing lesion. Upper Abdomen: No acute abnormality. Musculoskeletal: Right proximal humeral ORIF has been performed. Subcutaneous gas within the right shoulder and anterior chest wall are postsurgical in nature. No acute bone abnormality. Review of the MIP images confirms the above findings. IMPRESSION: 1. No pulmonary embolism. No acute intrathoracic pathology identified. 2. Moderate multi-vessel coronary artery calcification. 3. Morphologic changes in keeping with  pulmonary arterial hypertension. Aortic Atherosclerosis (ICD10-I70.0) and Emphysema (ICD10-J43.9). Electronically Signed   By: Helyn Numbers M.D.   On: 03/20/2023 00:26   DG Shoulder Right  Result Date: 03/19/2023 CLINICAL DATA:  Elective surgery. EXAM: RIGHT SHOULDER - 2+ VIEW COMPARISON:  None Available. FINDINGS: Two fluoroscopic spot views of the right shoulder obtained in the operating room. Plate and screw fixation of the proximal humerus. Fluoroscopy time 44 seconds. Dose 4.856 mGy high IMPRESSION: Intraoperative fluoroscopy during right shoulder surgery. Electronically Signed   By: Narda Rutherford M.D.   On: 03/19/2023 17:05   DG C-Arm 1-60 Min-No Report  Result Date: 03/19/2023 Fluoroscopy was utilized by the requesting physician.  No radiographic interpretation.   DG C-Arm 1-60 Min-No Report  Result Date: 03/19/2023 Fluoroscopy was utilized by the requesting physician.  No radiographic interpretation.    EKG: Independently reviewed.   Assessment and Plan: * Acute respiratory failure with hypoxia (HCC) Persistent new oxygen requirement following anesthesia and surgery yesterday of unclear cause. Worth noting that pt has h/o same thing  happening previously after anesthesia, had to be sent home on oxygen at that time. CTA chest neg for PNA, CHF findings, and PE. Pt completely asymptomatic on oxygen, no wheezing to suggest COPD exacerbation. From a neuro standpoint, pt is AAOx4, wide awake, which is actually quite impressive for 0500. At this point admitting pt for O2 and cont pulse ox Watch for development of other symptoms which could suggest another cause. Day team may want to curbside anesthesia this AM to see if they have any ideas what might be causing her persistent new O2 requirement.  Chronic diastolic CHF (congestive heart failure) (HCC) No pulm edema, peripheral edema, or other symptoms to suggest that this is in decompensation.  COPD (chronic obstructive pulmonary disease) (HCC) Cont home nebs No wheezing or symptoms to suggest exacerbation.  Essential hypertension Cont home meds once med rec completed: norvasc, candesartan, coreg.      Advance Care Planning:   Code Status: Full Code  Consults: None  Family Communication: Daughter at bedside  Severity of Illness: The appropriate patient status for this patient is OBSERVATION. Observation status is judged to be reasonable and necessary in order to provide the required intensity of service to ensure the patient's safety. The patient's presenting symptoms, physical exam findings, and initial radiographic and laboratory data in the context of their medical condition is felt to place them at decreased risk for further clinical deterioration. Furthermore, it is anticipated that the patient will be medically stable for discharge from the hospital within 2 midnights of admission.   Author: Hillary Bow., DO 03/20/2023 5:18 AM  For on call review [email or web address].

## 2023-03-20 NOTE — Progress Notes (Signed)
OT Cancellation Note  Patient Details Name: WYONA NEILS MRN: [id number] DOB: [birth year]   Cancelled Treatment:    Reason Eval/Treat Not Completed: Patient at procedure or test/ unavailable. Echo currently in progress, OT will re-attempt as able.  Paeton Latouche L. Jayjay Littles, OTR/L  03/20/23, 3:42 PM

## 2023-03-20 NOTE — Assessment & Plan Note (Signed)
Cont home meds once med rec completed: norvasc, candesartan, coreg.

## 2023-03-20 NOTE — TOC Initial Note (Addendum)
Transition of Care [facility]) - Initial/Assessment Note    Patient Details  Name: Brenda [name] MRN: [id number] Date of Birth: 1960-09-15  [facility] CM/SW Contact:    Harriett Sine, RN Phone Number: 03/20/2023, 11:00 AM  Clinical Narrative:                 Pt from home, pt has pcp. Spoke with pt and family at bedside about DME needs and d/c plans. Pt states she doesn't have equipment  at home and does not use O2 at home. TOC following.        Patient Goals and CMS Choice            Expected Discharge Plan and Services                                              Prior Living Arrangements/Services                       Activities of Daily Living   ADL Screening (condition at time of admission) Independently performs ADLs?: Yes (appropriate for developmental age) Is the patient deaf or have difficulty hearing?: Yes Does the patient have difficulty seeing, even when wearing glasses/contacts?: No Does the patient have difficulty concentrating, remembering, or making decisions?: No  Permission Sought/Granted                  Emotional Assessment              Admission diagnosis:  Acute respiratory failure with hypoxia (HCC) [J96.01] Patient Active Problem List   Diagnosis Date Noted   Pneumonia 07/09/2019   Acute respiratory disease due to COVID-19 virus 07/09/2019   Obesity (BMI 30.0-34.9) 07/09/2019   Chronic diastolic CHF (congestive heart failure) (HCC) 11/02/2014   Acute respiratory failure with hypoxia (HCC) 11/01/2014   COPD exacerbation (HCC) 10/31/2014   Hyperglycemia 10/31/2014   Polycythemia 10/31/2014   Strep throat    Research subject 10/10/2011   ANEMIA, CHRONIC 11/03/2008   Essential hypertension 11/03/2008   Coronary atherosclerosis 11/03/2008   CAROTID ARTERY OCCLUSION 11/03/2008   PULMONARY NODULE, SOLITARY 11/03/2008   HYPERCHOLESTEROLEMIA 10/20/2008   CARDIOMYOPATHY 10/20/2008   COPD (chronic  obstructive pulmonary disease) (HCC) 10/20/2008   RENAL ARTERY STENOSIS 10/09/2008   Atherosclerosis of native arteries of the extremities with intermittent claudication 10/09/2008   PCP:  [facility]:   [facility] [address], [place] - [address] [place] [address] Phone: [phone] Fax: [phone]  [id number] - [place], [place] - [address] AT [facility] [address] [place] [address] Phone: [phone] Fax: [phone]     Social Determinants of Health (SDOH) Social History: SDOH Screenings   Food Insecurity: No Food Insecurity (03/20/2023)  Housing: Low Risk  (03/20/2023)  Transportation Needs: No Transportation Needs (03/20/2023)  Utilities: Not At Risk (03/20/2023)  Financial Resource Strain: Low Risk  (01/29/2023)   Received from [facility]  Physical Activity: Insufficiently Active (01/29/2023)   Received from [facility]  Social Connections: Socially Integrated (01/29/2023)   [facility]  Stress: No Stress Concern Present (01/29/2023)   Received from [facility]  Tobacco Use: Medium Risk (03/19/2023)   SDOH Interventions:     Readmission Risk Interventions  No data to display

## 2023-03-20 NOTE — Assessment & Plan Note (Signed)
Cont home nebs No wheezing or symptoms to suggest exacerbation.

## 2023-03-21 DIAGNOSIS — J9601 Acute respiratory failure with hypoxia: Secondary | ICD-10-CM | POA: Diagnosis not present

## 2023-03-21 LAB — URINE CULTURE: Culture: NO GROWTH

## 2023-03-21 LAB — BLOOD GAS, ARTERIAL
Acid-base deficit: 0.8 mmol/L (ref 0.0–2.0)
Bicarbonate: 23.5 mmol/L (ref 20.0–28.0)
Drawn by: 56037
O2 Content: 8 L/min
O2 Saturation: 97.9 %
Patient temperature: 36.9
pCO2 arterial: 37 mm[Hg] (ref 32–48)
pH, Arterial: 7.41 (ref 7.35–7.45)
pO2, Arterial: 72 mm[Hg] — ABNORMAL LOW (ref 83–108)

## 2023-03-21 MED ORDER — CARVEDILOL 6.25 MG PO TABS: 6.2500 mg | ORAL_TABLET | Freq: Two times a day (BID) | ORAL | Status: AC

## 2023-03-21 MED ORDER — CEPHALEXIN 500 MG PO CAPS: 500.0000 mg | ORAL_CAPSULE | Freq: Three times a day (TID) | ORAL | 0 refills | Status: AC

## 2023-03-21 MED ORDER — CANDESARTAN CILEXETIL 16 MG PO TABS: 16.0000 mg | ORAL_TABLET | Freq: Every day | ORAL | Status: AC

## 2023-03-21 NOTE — Discharge Summary (Signed)
Report Status 03/21/2023 FINAL  Final     Labs:   Basic Metabolic Panel: Recent Labs  Lab 03/19/23 1015 03/19/23 2140 03/20/23 0616  NA 139 136 137  K 3.7 5.1 3.8  CL 105 105 101  CO2 25 20* 23  GLUCOSE 109* 138* 132*  BUN 14 13 12   CREATININE 0.99 0.99 0.89  CALCIUM 9.1 8.6* 8.7*   Liver Function Tests: Recent Labs  Lab 03/19/23 2140  AST 29  ALT 13  ALKPHOS 77  BILITOT 0.8  PROT 7.2  ALBUMIN 3.6   CBC: Recent Labs   Lab 03/19/23 1015 03/19/23 2140 03/20/23 0616  WBC 15.7* 16.5* 14.5*  NEUTROABS  --  15.2* 13.2*  HGB 13.0 12.0 11.3*  HCT 43.3 40.0 37.1  MCV 89.8 90.5 87.3  PLT 340 341 300     CBG: Recent Labs  Lab 03/19/23 2139  GLUCAP 140*     IMAGING STUDIES ECHOCARDIOGRAM COMPLETE  Result Date: 03/20/2023    ECHOCARDIOGRAM REPORT   Patient Name:   Brenda [name] Date of Exam: 03/20/2023 Medical Rec #:  [id number]       Height:       65.0 in Accession #:    [id number]      Weight:       198.0 lb Date of Birth:  1960/08/20        BSA:          1.970 m Patient Age:    62 years        BP:           79/53 mmHg Patient Gender: F               HR:           79 bpm. Exam Location:  Inpatient Procedure: 2D Echo, Cardiac Doppler and Color Doppler Indications:    R06.02 SOB  History:        Patient has prior history of Echocardiogram examinations, most                 recent 10/31/2014. Cardiomyopathy and CHF, CAD, COPD,                 Signs/Symptoms:Shortness of Breath and Dyspnea; Risk                 Factors:Hypertension.  Sonographer:    Sheralyn Boatman RDCS Referring Phys: [id number] [facility]  Sonographer Comments: Image acquisition challenging due to patient body habitus. IMPRESSIONS  1. Left ventricular ejection fraction, by estimation, is 60 to 65%. The left ventricle has normal function. The left ventricle has no regional wall motion abnormalities. There is mild left ventricular hypertrophy. Left ventricular diastolic parameters were normal.  2. Right ventricular systolic function is normal. The right ventricular size is normal. There is mildly elevated pulmonary artery systolic pressure. The estimated right ventricular systolic pressure is 44.2 mmHg.  3. The mitral valve is degenerative. Mild mitral valve regurgitation. No evidence of mitral stenosis.  4. The aortic valve is grossly normal. Aortic valve regurgitation is not visualized. Aortic valve sclerosis is present, with no evidence of aortic valve  stenosis.  5. Aortic Proximal ascending aorta is within normal limits.  6. The inferior vena cava is dilated in size with <50% respiratory variability, suggesting right atrial pressure of 15 mmHg. FINDINGS  Left Ventricle: Left ventricular ejection fraction, by estimation, is 60 to 65%. The left ventricle has normal function. The left ventricle has no regional wall motion  Report Status 03/21/2023 FINAL  Final     Labs:   Basic Metabolic Panel: Recent Labs  Lab 03/19/23 1015 03/19/23 2140 03/20/23 0616  NA 139 136 137  K 3.7 5.1 3.8  CL 105 105 101  CO2 25 20* 23  GLUCOSE 109* 138* 132*  BUN 14 13 12   CREATININE 0.99 0.99 0.89  CALCIUM 9.1 8.6* 8.7*   Liver Function Tests: Recent Labs  Lab 03/19/23 2140  AST 29  ALT 13  ALKPHOS 77  BILITOT 0.8  PROT 7.2  ALBUMIN 3.6   CBC: Recent Labs   Lab 03/19/23 1015 03/19/23 2140 03/20/23 0616  WBC 15.7* 16.5* 14.5*  NEUTROABS  --  15.2* 13.2*  HGB 13.0 12.0 11.3*  HCT 43.3 40.0 37.1  MCV 89.8 90.5 87.3  PLT 340 341 300     CBG: Recent Labs  Lab 03/19/23 2139  GLUCAP 140*     IMAGING STUDIES ECHOCARDIOGRAM COMPLETE  Result Date: 03/20/2023    ECHOCARDIOGRAM REPORT   Patient Name:   Brenda [name] Date of Exam: 03/20/2023 Medical Rec #:  [id number]       Height:       65.0 in Accession #:    [id number]      Weight:       198.0 lb Date of Birth:  1960/08/20        BSA:          1.970 m Patient Age:    62 years        BP:           79/53 mmHg Patient Gender: F               HR:           79 bpm. Exam Location:  Inpatient Procedure: 2D Echo, Cardiac Doppler and Color Doppler Indications:    R06.02 SOB  History:        Patient has prior history of Echocardiogram examinations, most                 recent 10/31/2014. Cardiomyopathy and CHF, CAD, COPD,                 Signs/Symptoms:Shortness of Breath and Dyspnea; Risk                 Factors:Hypertension.  Sonographer:    Sheralyn Boatman RDCS Referring Phys: [id number] [facility]  Sonographer Comments: Image acquisition challenging due to patient body habitus. IMPRESSIONS  1. Left ventricular ejection fraction, by estimation, is 60 to 65%. The left ventricle has normal function. The left ventricle has no regional wall motion abnormalities. There is mild left ventricular hypertrophy. Left ventricular diastolic parameters were normal.  2. Right ventricular systolic function is normal. The right ventricular size is normal. There is mildly elevated pulmonary artery systolic pressure. The estimated right ventricular systolic pressure is 44.2 mmHg.  3. The mitral valve is degenerative. Mild mitral valve regurgitation. No evidence of mitral stenosis.  4. The aortic valve is grossly normal. Aortic valve regurgitation is not visualized. Aortic valve sclerosis is present, with no evidence of aortic valve  stenosis.  5. Aortic Proximal ascending aorta is within normal limits.  6. The inferior vena cava is dilated in size with <50% respiratory variability, suggesting right atrial pressure of 15 mmHg. FINDINGS  Left Ventricle: Left ventricular ejection fraction, by estimation, is 60 to 65%. The left ventricle has normal function. The left ventricle has no regional wall motion  Report Status 03/21/2023 FINAL  Final     Labs:   Basic Metabolic Panel: Recent Labs  Lab 03/19/23 1015 03/19/23 2140 03/20/23 0616  NA 139 136 137  K 3.7 5.1 3.8  CL 105 105 101  CO2 25 20* 23  GLUCOSE 109* 138* 132*  BUN 14 13 12   CREATININE 0.99 0.99 0.89  CALCIUM 9.1 8.6* 8.7*   Liver Function Tests: Recent Labs  Lab 03/19/23 2140  AST 29  ALT 13  ALKPHOS 77  BILITOT 0.8  PROT 7.2  ALBUMIN 3.6   CBC: Recent Labs   Lab 03/19/23 1015 03/19/23 2140 03/20/23 0616  WBC 15.7* 16.5* 14.5*  NEUTROABS  --  15.2* 13.2*  HGB 13.0 12.0 11.3*  HCT 43.3 40.0 37.1  MCV 89.8 90.5 87.3  PLT 340 341 300     CBG: Recent Labs  Lab 03/19/23 2139  GLUCAP 140*     IMAGING STUDIES ECHOCARDIOGRAM COMPLETE  Result Date: 03/20/2023    ECHOCARDIOGRAM REPORT   Patient Name:   Brenda [name] Date of Exam: 03/20/2023 Medical Rec #:  [id number]       Height:       65.0 in Accession #:    [id number]      Weight:       198.0 lb Date of Birth:  1960/08/20        BSA:          1.970 m Patient Age:    62 years        BP:           79/53 mmHg Patient Gender: F               HR:           79 bpm. Exam Location:  Inpatient Procedure: 2D Echo, Cardiac Doppler and Color Doppler Indications:    R06.02 SOB  History:        Patient has prior history of Echocardiogram examinations, most                 recent 10/31/2014. Cardiomyopathy and CHF, CAD, COPD,                 Signs/Symptoms:Shortness of Breath and Dyspnea; Risk                 Factors:Hypertension.  Sonographer:    Sheralyn Boatman RDCS Referring Phys: [id number] [facility]  Sonographer Comments: Image acquisition challenging due to patient body habitus. IMPRESSIONS  1. Left ventricular ejection fraction, by estimation, is 60 to 65%. The left ventricle has normal function. The left ventricle has no regional wall motion abnormalities. There is mild left ventricular hypertrophy. Left ventricular diastolic parameters were normal.  2. Right ventricular systolic function is normal. The right ventricular size is normal. There is mildly elevated pulmonary artery systolic pressure. The estimated right ventricular systolic pressure is 44.2 mmHg.  3. The mitral valve is degenerative. Mild mitral valve regurgitation. No evidence of mitral stenosis.  4. The aortic valve is grossly normal. Aortic valve regurgitation is not visualized. Aortic valve sclerosis is present, with no evidence of aortic valve  stenosis.  5. Aortic Proximal ascending aorta is within normal limits.  6. The inferior vena cava is dilated in size with <50% respiratory variability, suggesting right atrial pressure of 15 mmHg. FINDINGS  Left Ventricle: Left ventricular ejection fraction, by estimation, is 60 to 65%. The left ventricle has normal function. The left ventricle has no regional wall motion  [facility]  Physician Discharge Summary   Patient ID: Brenda [name] MRN: [id number] DOB/AGE: November 22, 1960 62 y.o.  Admit date: 03/19/2023 Discharge date: 03/21/2023    PCP: [facility]  DISCHARGE DIAGNOSES:    Acute respiratory failure with hypoxia (HCC)   Essential hypertension   COPD (chronic obstructive pulmonary disease) (HCC)   Chronic diastolic CHF (congestive heart failure) (HCC)   RECOMMENDATIONS FOR OUTPATIENT FOLLOW UP: Home oxygen has been ordered.   Home Health: None Equipment/Devices: Home O2  CODE STATUS: Full code  DISCHARGE CONDITION: fair  Diet recommendation: As before  INITIAL HISTORY: 62 y.o. female with medical history significant of CAD, CHF, COPD, HTN.   Pt not on home O2 at baseline.   Pt had proximal humeral fixation this AM.  Discharged home in afternoon.  Per family at bedside patient's daughter was trying to wake the patient up at home but she was nonresponsive for 2 or 3 minutes and just had a blank stare.  Pt then returned to normal self.   EMS called, pt found to be hypoxic by EMS dropping to mid 80s on RA.  Started on 6L.   In ED pt is completely asymptomatic, denies SOB, wheezing, cough, she is AAOx4 and wide awake but has persistent 5-6L O2 requirement which is new.   ED work up, though reasonably extensive, is unrevealing as to the cause of her new O2 requirement.   HOSPITAL COURSE:   Acute respiratory failure with hypoxia Persistent new oxygen requirement following anesthesia and surgery of unclear cause. Worth noting that pt has h/o same thing happening previously after anesthesia, had to be sent home on oxygen at that time. CTA chest neg for PNA, CHF findings, and PE. Patient initially required 10 L of oxygen by nasal cannula.  This was slowly weaned down to 3 to 4 L.  She ambulated without difficulty.  Did have drop in saturation levels with ambulation.  Will be discharged on home  oxygen.   Chronic diastolic CHF (congestive heart failure) (HCC) No pulm edema, peripheral edema, or other symptoms to suggest that this is in decompensation.    COPD (chronic obstructive pulmonary disease) (HCC) Cont home nebs No wheezing or symptoms to suggest exacerbation.   Essential hypertension Noted to be hypotensive.  Was given fluid boluses.  Antihypertensives were held.  She was asked to monitor her blood pressures closely at home.  Amlodipine discontinued.  Carvedilol can be resumed with systolic blood pressures are greater than 100 on Monday.  Then candesartan can be resumed 2 days after that if the systolic blood pressure remains greater than 100.  Recent shoulder surgery As per orthopedics in the outpatient setting.  Urinary tract infection Treated with ceftriaxone.  Discharged on Keflex.  Urine cultures without any growth.   Obesity Estimated body mass index is 32.95 kg/m as calculated from the following:   Height as of an earlier encounter on 03/19/23: 5\' 5"  (1.651 m).   Weight as of this encounter: 89.8 kg.   Patient is stable.  Okay for discharge home today.  PERTINENT LABS:  The results of significant diagnostics from this hospitalization (including imaging, microbiology, ancillary and laboratory) are listed below for reference.    Microbiology: Recent Results (from the past 240 hour(s))  Resp panel by RT-PCR (RSV, Flu A&B, Covid) Anterior Nasal Swab     Status: None   Collection Time: 03/19/23 10:28 PM   Specimen: Anterior Nasal Swab  Result Value Ref Range Status  Report Status 03/21/2023 FINAL  Final     Labs:   Basic Metabolic Panel: Recent Labs  Lab 03/19/23 1015 03/19/23 2140 03/20/23 0616  NA 139 136 137  K 3.7 5.1 3.8  CL 105 105 101  CO2 25 20* 23  GLUCOSE 109* 138* 132*  BUN 14 13 12   CREATININE 0.99 0.99 0.89  CALCIUM 9.1 8.6* 8.7*   Liver Function Tests: Recent Labs  Lab 03/19/23 2140  AST 29  ALT 13  ALKPHOS 77  BILITOT 0.8  PROT 7.2  ALBUMIN 3.6   CBC: Recent Labs   Lab 03/19/23 1015 03/19/23 2140 03/20/23 0616  WBC 15.7* 16.5* 14.5*  NEUTROABS  --  15.2* 13.2*  HGB 13.0 12.0 11.3*  HCT 43.3 40.0 37.1  MCV 89.8 90.5 87.3  PLT 340 341 300     CBG: Recent Labs  Lab 03/19/23 2139  GLUCAP 140*     IMAGING STUDIES ECHOCARDIOGRAM COMPLETE  Result Date: 03/20/2023    ECHOCARDIOGRAM REPORT   Patient Name:   Brenda [name] Date of Exam: 03/20/2023 Medical Rec #:  [id number]       Height:       65.0 in Accession #:    [id number]      Weight:       198.0 lb Date of Birth:  1960/08/20        BSA:          1.970 m Patient Age:    62 years        BP:           79/53 mmHg Patient Gender: F               HR:           79 bpm. Exam Location:  Inpatient Procedure: 2D Echo, Cardiac Doppler and Color Doppler Indications:    R06.02 SOB  History:        Patient has prior history of Echocardiogram examinations, most                 recent 10/31/2014. Cardiomyopathy and CHF, CAD, COPD,                 Signs/Symptoms:Shortness of Breath and Dyspnea; Risk                 Factors:Hypertension.  Sonographer:    Sheralyn Boatman RDCS Referring Phys: [id number] [facility]  Sonographer Comments: Image acquisition challenging due to patient body habitus. IMPRESSIONS  1. Left ventricular ejection fraction, by estimation, is 60 to 65%. The left ventricle has normal function. The left ventricle has no regional wall motion abnormalities. There is mild left ventricular hypertrophy. Left ventricular diastolic parameters were normal.  2. Right ventricular systolic function is normal. The right ventricular size is normal. There is mildly elevated pulmonary artery systolic pressure. The estimated right ventricular systolic pressure is 44.2 mmHg.  3. The mitral valve is degenerative. Mild mitral valve regurgitation. No evidence of mitral stenosis.  4. The aortic valve is grossly normal. Aortic valve regurgitation is not visualized. Aortic valve sclerosis is present, with no evidence of aortic valve  stenosis.  5. Aortic Proximal ascending aorta is within normal limits.  6. The inferior vena cava is dilated in size with <50% respiratory variability, suggesting right atrial pressure of 15 mmHg. FINDINGS  Left Ventricle: Left ventricular ejection fraction, by estimation, is 60 to 65%. The left ventricle has normal function. The left ventricle has no regional wall motion  Report Status 03/21/2023 FINAL  Final     Labs:   Basic Metabolic Panel: Recent Labs  Lab 03/19/23 1015 03/19/23 2140 03/20/23 0616  NA 139 136 137  K 3.7 5.1 3.8  CL 105 105 101  CO2 25 20* 23  GLUCOSE 109* 138* 132*  BUN 14 13 12   CREATININE 0.99 0.99 0.89  CALCIUM 9.1 8.6* 8.7*   Liver Function Tests: Recent Labs  Lab 03/19/23 2140  AST 29  ALT 13  ALKPHOS 77  BILITOT 0.8  PROT 7.2  ALBUMIN 3.6   CBC: Recent Labs   Lab 03/19/23 1015 03/19/23 2140 03/20/23 0616  WBC 15.7* 16.5* 14.5*  NEUTROABS  --  15.2* 13.2*  HGB 13.0 12.0 11.3*  HCT 43.3 40.0 37.1  MCV 89.8 90.5 87.3  PLT 340 341 300     CBG: Recent Labs  Lab 03/19/23 2139  GLUCAP 140*     IMAGING STUDIES ECHOCARDIOGRAM COMPLETE  Result Date: 03/20/2023    ECHOCARDIOGRAM REPORT   Patient Name:   Brenda [name] Date of Exam: 03/20/2023 Medical Rec #:  [id number]       Height:       65.0 in Accession #:    [id number]      Weight:       198.0 lb Date of Birth:  1960/08/20        BSA:          1.970 m Patient Age:    62 years        BP:           79/53 mmHg Patient Gender: F               HR:           79 bpm. Exam Location:  Inpatient Procedure: 2D Echo, Cardiac Doppler and Color Doppler Indications:    R06.02 SOB  History:        Patient has prior history of Echocardiogram examinations, most                 recent 10/31/2014. Cardiomyopathy and CHF, CAD, COPD,                 Signs/Symptoms:Shortness of Breath and Dyspnea; Risk                 Factors:Hypertension.  Sonographer:    Sheralyn Boatman RDCS Referring Phys: [id number] [facility]  Sonographer Comments: Image acquisition challenging due to patient body habitus. IMPRESSIONS  1. Left ventricular ejection fraction, by estimation, is 60 to 65%. The left ventricle has normal function. The left ventricle has no regional wall motion abnormalities. There is mild left ventricular hypertrophy. Left ventricular diastolic parameters were normal.  2. Right ventricular systolic function is normal. The right ventricular size is normal. There is mildly elevated pulmonary artery systolic pressure. The estimated right ventricular systolic pressure is 44.2 mmHg.  3. The mitral valve is degenerative. Mild mitral valve regurgitation. No evidence of mitral stenosis.  4. The aortic valve is grossly normal. Aortic valve regurgitation is not visualized. Aortic valve sclerosis is present, with no evidence of aortic valve  stenosis.  5. Aortic Proximal ascending aorta is within normal limits.  6. The inferior vena cava is dilated in size with <50% respiratory variability, suggesting right atrial pressure of 15 mmHg. FINDINGS  Left Ventricle: Left ventricular ejection fraction, by estimation, is 60 to 65%. The left ventricle has normal function. The left ventricle has no regional wall motion  Report Status 03/21/2023 FINAL  Final     Labs:   Basic Metabolic Panel: Recent Labs  Lab 03/19/23 1015 03/19/23 2140 03/20/23 0616  NA 139 136 137  K 3.7 5.1 3.8  CL 105 105 101  CO2 25 20* 23  GLUCOSE 109* 138* 132*  BUN 14 13 12   CREATININE 0.99 0.99 0.89  CALCIUM 9.1 8.6* 8.7*   Liver Function Tests: Recent Labs  Lab 03/19/23 2140  AST 29  ALT 13  ALKPHOS 77  BILITOT 0.8  PROT 7.2  ALBUMIN 3.6   CBC: Recent Labs   Lab 03/19/23 1015 03/19/23 2140 03/20/23 0616  WBC 15.7* 16.5* 14.5*  NEUTROABS  --  15.2* 13.2*  HGB 13.0 12.0 11.3*  HCT 43.3 40.0 37.1  MCV 89.8 90.5 87.3  PLT 340 341 300     CBG: Recent Labs  Lab 03/19/23 2139  GLUCAP 140*     IMAGING STUDIES ECHOCARDIOGRAM COMPLETE  Result Date: 03/20/2023    ECHOCARDIOGRAM REPORT   Patient Name:   Brenda [name] Date of Exam: 03/20/2023 Medical Rec #:  [id number]       Height:       65.0 in Accession #:    [id number]      Weight:       198.0 lb Date of Birth:  1960/08/20        BSA:          1.970 m Patient Age:    62 years        BP:           79/53 mmHg Patient Gender: F               HR:           79 bpm. Exam Location:  Inpatient Procedure: 2D Echo, Cardiac Doppler and Color Doppler Indications:    R06.02 SOB  History:        Patient has prior history of Echocardiogram examinations, most                 recent 10/31/2014. Cardiomyopathy and CHF, CAD, COPD,                 Signs/Symptoms:Shortness of Breath and Dyspnea; Risk                 Factors:Hypertension.  Sonographer:    Sheralyn Boatman RDCS Referring Phys: [id number] [facility]  Sonographer Comments: Image acquisition challenging due to patient body habitus. IMPRESSIONS  1. Left ventricular ejection fraction, by estimation, is 60 to 65%. The left ventricle has normal function. The left ventricle has no regional wall motion abnormalities. There is mild left ventricular hypertrophy. Left ventricular diastolic parameters were normal.  2. Right ventricular systolic function is normal. The right ventricular size is normal. There is mildly elevated pulmonary artery systolic pressure. The estimated right ventricular systolic pressure is 44.2 mmHg.  3. The mitral valve is degenerative. Mild mitral valve regurgitation. No evidence of mitral stenosis.  4. The aortic valve is grossly normal. Aortic valve regurgitation is not visualized. Aortic valve sclerosis is present, with no evidence of aortic valve  stenosis.  5. Aortic Proximal ascending aorta is within normal limits.  6. The inferior vena cava is dilated in size with <50% respiratory variability, suggesting right atrial pressure of 15 mmHg. FINDINGS  Left Ventricle: Left ventricular ejection fraction, by estimation, is 60 to 65%. The left ventricle has normal function. The left ventricle has no regional wall motion  Report Status 03/21/2023 FINAL  Final     Labs:   Basic Metabolic Panel: Recent Labs  Lab 03/19/23 1015 03/19/23 2140 03/20/23 0616  NA 139 136 137  K 3.7 5.1 3.8  CL 105 105 101  CO2 25 20* 23  GLUCOSE 109* 138* 132*  BUN 14 13 12   CREATININE 0.99 0.99 0.89  CALCIUM 9.1 8.6* 8.7*   Liver Function Tests: Recent Labs  Lab 03/19/23 2140  AST 29  ALT 13  ALKPHOS 77  BILITOT 0.8  PROT 7.2  ALBUMIN 3.6   CBC: Recent Labs   Lab 03/19/23 1015 03/19/23 2140 03/20/23 0616  WBC 15.7* 16.5* 14.5*  NEUTROABS  --  15.2* 13.2*  HGB 13.0 12.0 11.3*  HCT 43.3 40.0 37.1  MCV 89.8 90.5 87.3  PLT 340 341 300     CBG: Recent Labs  Lab 03/19/23 2139  GLUCAP 140*     IMAGING STUDIES ECHOCARDIOGRAM COMPLETE  Result Date: 03/20/2023    ECHOCARDIOGRAM REPORT   Patient Name:   Brenda [name] Date of Exam: 03/20/2023 Medical Rec #:  [id number]       Height:       65.0 in Accession #:    [id number]      Weight:       198.0 lb Date of Birth:  1960/08/20        BSA:          1.970 m Patient Age:    62 years        BP:           79/53 mmHg Patient Gender: F               HR:           79 bpm. Exam Location:  Inpatient Procedure: 2D Echo, Cardiac Doppler and Color Doppler Indications:    R06.02 SOB  History:        Patient has prior history of Echocardiogram examinations, most                 recent 10/31/2014. Cardiomyopathy and CHF, CAD, COPD,                 Signs/Symptoms:Shortness of Breath and Dyspnea; Risk                 Factors:Hypertension.  Sonographer:    Sheralyn Boatman RDCS Referring Phys: [id number] [facility]  Sonographer Comments: Image acquisition challenging due to patient body habitus. IMPRESSIONS  1. Left ventricular ejection fraction, by estimation, is 60 to 65%. The left ventricle has normal function. The left ventricle has no regional wall motion abnormalities. There is mild left ventricular hypertrophy. Left ventricular diastolic parameters were normal.  2. Right ventricular systolic function is normal. The right ventricular size is normal. There is mildly elevated pulmonary artery systolic pressure. The estimated right ventricular systolic pressure is 44.2 mmHg.  3. The mitral valve is degenerative. Mild mitral valve regurgitation. No evidence of mitral stenosis.  4. The aortic valve is grossly normal. Aortic valve regurgitation is not visualized. Aortic valve sclerosis is present, with no evidence of aortic valve  stenosis.  5. Aortic Proximal ascending aorta is within normal limits.  6. The inferior vena cava is dilated in size with <50% respiratory variability, suggesting right atrial pressure of 15 mmHg. FINDINGS  Left Ventricle: Left ventricular ejection fraction, by estimation, is 60 to 65%. The left ventricle has normal function. The left ventricle has no regional wall motion

## 2023-03-21 NOTE — Treatment Plan (Addendum)
SATURATION QUALIFICATIONS: (This note is used to comply with regulatory documentation for home oxygen)  Patient Saturations on Room Air at Rest =87%  Patient Saturations on Room Air while Ambulating = 79%  Patient Saturations on 3 Liters of oxygen while Ambulating = 90%  Please briefly explain why patient needs home oxygen:

## 2023-03-21 NOTE — Plan of Care (Addendum)
  Problem: Education: Goal: Understanding of activity limitations/precautions following surgery will improve Outcome: Progressing Goal: Individualized Educational Video(s) Outcome: Progressing   Problem: Activity: Goal: Ability to tolerate increased activity will improve Outcome: Progressing   Problem: Pain Management: Goal: Pain level will decrease with appropriate interventions Outcome: Progressing   Problem: Education: Goal: Knowledge of General Education information will improve Description: Including pain rating scale, medication(s)/side effects and non-pharmacologic comfort measures Outcome: Progressing   Problem: Clinical Measurements: Goal: Will remain free from infection Outcome: Progressing   Problem: Activity: Goal: Risk for activity intolerance will decrease Outcome: Progressing   Problem: Nutrition: Goal: Adequate nutrition will be maintained Outcome: Progressing   Problem: Coping: Goal: Level of anxiety will decrease Outcome: Progressing   Problem: Pain Managment: Goal: General experience of comfort will improve Outcome: Progressing  AVS was printed, reviewed with, and given to the patient. The pickup address of the pharmacy was indicated. The patient verbalized understanding its content. The patient was in a stable condition when leaving the unit. The nurse on duty pushed on a wheel chair up to the entrance of the hospital. The patient's friend gave the patient ride back to her home.

## 2023-03-21 NOTE — Plan of Care (Signed)

## 2023-03-21 NOTE — Progress Notes (Signed)
PHYSICAL THERAPY EVALUATION CLINICAL IMPRESSION Pt seen POD2 received supine in bed, reporting 5/10 R shoulder pain. Demonstrated modified independence for bed mobility, SUP for transfers, and mod IND for ambulation in room 46ft. All education completed and pt has no further questions; confirmed pt has all DME needs and plan for supervision/assistance with daughter and her significant other. SpO2 monitored throughout and pt remained >/=92% via personal pulse ox on [address] [place]. Pt has met mobility goals for safe discharge home, PT is signing off, should needs change please reconsult. Thank you for this referral.   03/21/23 1112  PT Visit Information  Last PT Received On 03/21/23  Assistance Needed +1  History of Present Illness Patient is a 62 year old female who presented on 10/11 with shortness of breath. of note patient recently had R ORIF on 10/9. PMH: R displaced proximal humerus fracture,chronic diastolic CHF, COPD  Precautions  Precautions Shoulder  Precaution Comments sling at all times except ADLs/exercise  Required Braces or Orthoses Sling  Restrictions  Weight Bearing Restrictions Yes  RUE Weight Bearing NWB  Other Position/Activity Restrictions PROM 10 ER, 45 ABD,60 FE , PASSIVE ROM FOR ADL's ONLY, NOT for EXERCISES. OK to exercise elbow wrist and hand rom and for edema control   No pendulums, may allow arm to dangle   Pt may shower  Home Living  Family/patient expects to be discharged to: Private residence  Living Arrangements Children  Available Help at Discharge Family  Type of Home House  Home Access Stairs to enter  Entrance Stairs-Number of Steps 1  Home Layout Two level;1/2 bath on main level;Able to live on main level with bedroom/bathroom  Alternate Level Stairs-Number of Steps full bathroom upstairs; pt's bedroom is down stairs  Home Equipment Rollator (4 wheels)  Prior Function  Prior Level of Function  Independent/Modified Independent;Driving;History of Falls (last six  months)  Mobility Comments She was independent with ambulation.  ADLs Comments She was independent with ADLs, cooking, cleaning, and driving.  Pain Assessment  Pain Assessment 0-10  Pain Score 5  Pain Location R shoulder  Pain Descriptors / Indicators Grimacing;Discomfort  Pain Intervention(s) Ice applied;Repositioned  Cognition  Arousal Alert  Behavior During Therapy WFL for tasks assessed/performed  Overall Cognitive Status Within Functional Limits for tasks assessed  Communication  Communication No apparent difficulties  Upper Extremity Assessment  Upper Extremity Assessment Defer to OT evaluation  Lower Extremity Assessment  Lower Extremity Assessment Overall WFL for tasks assessed  Cervical / Trunk Assessment  Cervical / Trunk Assessment Normal  Bed Mobility  Overal bed mobility Needs Assistance  Bed Mobility Supine to Sit  Supine to sit Supervision  General bed mobility comments to L side of bed with increased time, HOB elevated.  Transfers  Overall transfer level Needs assistance  Equipment used None  Transfers Sit to/from Stand  Sit to Stand Supervision  General transfer comment For safety only  Ambulation/Gait  Ambulation/Gait assistance Modified independent (Device/Increase time)  Gait Distance (Feet) 35 Feet  Assistive device None  Gait Pattern/deviations WFL(Within Functional Limits)  General Gait Details Pt ambulated at mod IND level with multiple turns, no overt LOB noted nor physical assist required, pt holding RUE in sling.  Gait velocity decreased  Balance  Overall balance assessment Modified Independent  Shoulder Instructions  Pendulum exercises (written home exercise program)  (N/A)  Dressing change  (N/A)  PT - End of Session  Equipment Utilized During Treatment Gait belt  Activity Tolerance Patient tolerated treatment well;No increased pain  Patient left in bed;with call bell/phone within reach  Nurse Communication Mobility status  PT Assessment   PT Recommendation/Assessment All further PT needs can be met in the next venue of care  PT Visit Diagnosis History of falling (Z91.81)  PT Problem List Decreased strength;Decreased range of motion;Decreased activity tolerance;Decreased balance;Decreased mobility;Decreased knowledge of use of DME;Decreased safety awareness;Cardiopulmonary status limiting activity;Pain  AM-PAC PT "6 Clicks" Mobility Outcome Measure (Version 2)  Help needed turning from your back to your side while in a flat bed without using bedrails? 4  Help needed moving from lying on your back to sitting on the side of a flat bed without using bedrails? 3  Help needed moving to and from a bed to a chair (including a wheelchair)? 3  Help needed standing up from a chair using your arms (e.g., wheelchair or bedside chair)? 3  Help needed to walk in hospital room? 3  Help needed climbing 3-5 steps with a railing?  3  6 Click Score 19  Consider Recommendation of Discharge To: Home with [facility]  Progressive Mobility  What is the highest level of mobility based on the progressive mobility assessment? Level 5 (Walks with assist in room/hall) - Balance while stepping forward/back and can walk in room with assist - Complete  Mobility Referral No  Activity Ambulated independently in room  PT Recommendation  Follow Up Recommendations Follow physician's recommendations for discharge plan and follow up therapies  Patient can return home with the following A little help with walking and/or transfers;A little help with bathing/dressing/bathroom;Assistance with cooking/housework;Assist for transportation;Help with stairs or ramp for entrance  Functional Status Assessment Patient has had a recent decline in their functional status and demonstrates the ability to make significant improvements in function in a reasonable and predictable amount of time.  PT equipment None recommended by PT  Individuals Consulted  Consulted and Agree with Results and  Recommendations Patient  Acute Rehab PT Goals  Patient Stated Goal To go home  PT Goal Formulation With patient  Time For Goal Achievement 04/04/23  Potential to Achieve Goals Good  PT Time Calculation  PT Start Time (ACUTE ONLY) 1110  PT Stop Time (ACUTE ONLY) 1128  PT Time Calculation (min) (ACUTE ONLY) 18 min  PT General Charges  $$ ACUTE PT VISIT 1 Visit  PT Evaluation  $PT Eval Low Complexity 1 Low  Jamesetta Geralds, PT, DPT [facility] Rehabilitation Department Office: [phone]

## 2023-03-21 NOTE — Evaluation (Signed)
Occupational Therapy Evaluation Patient Details Name: Brenda [name] MRN: [id number] DOB: 11-03-1960 Today's Date: 03/21/2023   History of Present Illness Patient is a 62 year old female who presented on 10/11 with shortness of breath. of note patient recently had R ORIF on 10/9. PMH: R displaced proximal humerus fracture,chronic diastolic CHF, COPD   Clinical Impression   Patient is a 63 year old female who was admitted for above. Patient was on RA during session with no SOB. Patient was educated on shoulder restrictions and how to maintain them during ADLs on this date. Patient was educated with multimodal strategies for PROM of RUE but unable to complete herself with noted attempted AAROM of shoulder. Patient needed consistent cues to maintain PROM during session.  Patient reported she plans to d/c home with daughter at time of d/c. Patient's daughter has shoulder paperwork at home. Patient was re educated on calling number at bottom of paper if she had questions. Patient verbalized understanding. Patient would continue to benefit from skilled OT services at this time while admitted and after d/c to address noted deficits in order to improve overall safety and independence in ADLs.        If plan is discharge home, recommend the following: A little help with bathing/dressing/bathroom;Assist for transportation;Assistance with cooking/housework    Functional Status Assessment  Patient has had a recent decline in their functional status and demonstrates the ability to make significant improvements in function in a reasonable and predictable amount of time.  Equipment Recommendations  Tub/shower seat       Precautions / Restrictions Precautions Precaution Comments: sling at all times except ADLs/exercise Required Braces or Orthoses: Sling Restrictions Weight Bearing Restrictions: Yes RUE Weight Bearing: Non weight bearing Other Position/Activity Restrictions: PROM 10 ER, 45 ABD,60 FE ,  PASSIVE ROM FOR ADL's ONLY, NOT for EXERCISES. OK to exercise elbow wrist and hand rom and for edema control   No pendulums, may allow arm to dangle   Pt may shower      Mobility Bed Mobility Overal bed mobility: Needs Assistance Bed Mobility: Supine to Sit     Supine to sit: Supervision     General bed mobility comments: to L side of bed with increased time.        Balance Overall balance assessment: No apparent balance deficits (not formally assessed)         ADL either performed or assessed with clinical judgement   ADL Overall ADL's : Needs assistance/impaired Eating/Feeding: Set up;Sitting   Grooming: Set up;Sitting   Upper Body Bathing: Maximal assistance Upper Body Bathing Details (indicate cue type and reason): with education on sew saw method with patient unable to demonstrate PROM of R shoulder to engage in ADL task even with max education and multimodal cues.             Toilet Transfer: Supervision/safety;Set up;Ambulation Toilet Transfer Details (indicate cue type and reason): with cues for pushing up from bed v.s. attempting to pull up on therapist. Toileting- Clothing Manipulation and Hygiene: Minimal assistance;Sit to/from stand         General ADL Comments: patient was educated on shoulder precautions and how to maitnain them during session. showering was reviewed with education on importance of avoiding water on bandage especailly with patient scratching this area and there being a peeled area from scratching. patient verbalized understanding. patietn asked about sitting in a tub. patient was educated that getting into a tub was not advised at this time no recommended  by MD. patient verbalized understanding. patient was able to stand at sink and brush teeth with supervision. patient inquired about how to complete exercises during the day with daughter at work. patient was educated that recommendation was for 3x a day and that she is able to get her sling  on and off herself with supervision. patient reported she would have someone come help her with the ice machiene during the day.      Pertinent Vitals/Pain Pain Assessment Pain Assessment: 0-10 Pain Score: 5  Pain Location: R shoulder Pain Descriptors / Indicators: Grimacing, Discomfort Pain Intervention(s): Ice applied, Repositioned, Monitored during session, Premedicated before session           Cognition Arousal: Alert Behavior During Therapy: WFL for tasks assessed/performed Overall Cognitive Status: Within Functional Limits for tasks assessed          Exercises Shoulder Exercises Elbow Flexion: AROM, 10 reps Elbow Extension: AROM, 10 reps Wrist Flexion: AROM, 10 reps Wrist Extension: AROM, 10 reps Digit Composite Flexion: AROM, 10 reps Composite Extension: AROM, 10 reps   Shoulder Instructions Shoulder Instructions Donning/doffing shirt without moving shoulder: Moderate assistance Method for sponge bathing under operated UE: Maximal assistance Donning/doffing sling/immobilizer: Minimal assistance Correct positioning of sling/immobilizer: Minimal assistance ROM for elbow, wrist and digits of operated UE: Modified independent Sling wearing schedule (on at all times/off for ADL's): Patient able to independently direct caregiver Proper positioning of operated UE when showering: Patient able to independently direct caregiver Positioning of UE while sleeping: Patient able to independently direct caregiver    Home Living Family/patient expects to be discharged to:: Private residence Living Arrangements: Children Available Help at Discharge: Family Type of Home: House Home Access: Stairs to enter Secretary/administrator of Steps: 1   Home Layout: Two level;1/2 bath on main level;Able to live on main level with bedroom/bathroom Alternate Level Stairs-Number of Steps: full bathroom upstairs; pt's bedroom is down stairs             Home Equipment: Rollator (4  wheels)          Prior Functioning/Environment Prior Level of Function : Independent/Modified Independent;Driving             Mobility Comments: She was independent with ambulation. ADLs Comments: She was independent with ADLs, cooking, cleaning, and driving.        OT Problem List: Decreased range of motion;Impaired UE functional use;Pain      OT Treatment/Interventions:      OT Goals(Current goals can be found in the care plan section) Acute Rehab OT Goals Patient Stated Goal: to go home today OT Goal Formulation: With patient Time For Goal Achievement: 04/04/23 Potential to Achieve Goals: Fair  OT Frequency:         AM-PAC OT "6 Clicks" Daily Activity     Outcome Measure Help from another person eating meals?: None Help from another person taking care of personal grooming?: None Help from another person toileting, which includes using toliet, bedpan, or urinal?: A Little Help from another person bathing (including washing, rinsing, drying)?: A Little Help from another person to put on and taking off regular upper body clothing?: A Little Help from another person to put on and taking off regular lower body clothing?: A Little 6 Click Score: 20   End of Session Equipment Utilized During Treatment: Other (comment) (sling)  Activity Tolerance: Patient tolerated treatment well Patient left: in chair;with call bell/phone within reach  OT Visit Diagnosis: Muscle weakness (generalized) (M62.81)  Time: 9147-8295 OT Time Calculation (min): 43 min Charges:  OT General Charges $OT Visit: 1 Visit OT Evaluation $OT Eval Low Complexity: 1 Low OT Treatments $Self Care/Home Management : 23-37 mins  Kamelia Lampkins OTR/L, MS Acute Rehabilitation Department Office# [phone]   Selinda Flavin 03/21/2023, 10:17 AM

## 2023-03-21 NOTE — TOC Transition Note (Signed)
Transition of Care [facility]) - CM/SW Discharge Note   Patient Details  Name: Brenda [name] MRN: [id number] Date of Birth: 02-01-1961  [facility] CM/SW Contact:  Adrian Prows, RN Phone Number: 03/21/2023, 10:31 AM   Clinical Narrative:    Orders received for home oxygen; spoke w/ pt in room; she agrees to service; she does not have an agency preference; spoke w. Jermaine at [employer]; he says agency can provide service; travel tank will be delivered to pt's room; pt notified; agency contact info placed in follow up provider section of d/c instructions.   Final next level of care: Home/Self Care Barriers to Discharge: No Barriers Identified   Patient Goals and CMS Choice      Discharge Placement                         Discharge Plan and Services Additional resources added to the After Visit Summary for                  DME Arranged: Oxygen DME Agency: [employer] Date DME Agency Contacted: 03/21/23 Time DME Agency Contacted: 1031 Representative spoke with at DME Agency: Vaughan Basta            Social Determinants of Health (SDOH) Interventions SDOH Screenings   Food Insecurity: No Food Insecurity (03/20/2023)  Housing: Low Risk  (03/20/2023)  Transportation Needs: No Transportation Needs (03/20/2023)  Utilities: Not At Risk (03/20/2023)  Financial Resource Strain: Low Risk  (01/29/2023)   Received from [facility]  Physical Activity: Insufficiently Active (01/29/2023)   Received from [facility]  Social Connections: Socially Integrated (01/29/2023)   Received from [facility]  Stress: No Stress Concern Present (01/29/2023)   Received from [facility]  Tobacco Use: Medium Risk (03/19/2023)     Readmission Risk Interventions     No data to display

## 2023-03-25 NOTE — Progress Notes (Deleted)
Cardiology Office Note:    Date:  03/25/2023   ID:  Brenda [name], DOB [birth year], MRN [id number]  PCP:  [facility]   [facility] Providers Cardiologist:  Lorine Bears, MD { Click to update primary MD,subspecialty MD or APP then REFRESH:1}    Referring MD: [facility]   No chief complaint on file. ***  History of Present Illness:    Brenda [name] is a 62 y.o. female with a hx of PAD, CAD, chronic systolic heart failure with improved EF, COPD, prior tobacco use, HTN, HLD, and RAS s/p renal artery stenting 2008.   RAS stenting complicated by wire perforation treated with coils. ISR subsequently treated with covered stent. Carotid disease s/p bilateral CEA and right carotid to subclavian artery bypass. Angiography 05/2019 with patent renal artery stent and iliac artery stent but iliac artery disease progression treated with 2 additional stents.    Last seen 11/2021 by Dr. Kirke Corin. Dopplers are updated and stable. Recent echo reassuring.  She presents for annual cardiology follow up.     PAD  RAS s/p stenting x 2 for ISR Iliac artery disease s/p multiple stents Carotid artery stenosis s/p bilateral CEA and distal common carotid artery bypass graft to right subclavian. - last dopplers were 02/2023 stable, repeat in 1 year   Hyperlipidemia with LDL goal < 55 Need updated lipid panel Continue 20 mg crestor   Hypertension - candesartan and coreg    Left subclavian artery stenosis - left radial pulse intact   CAD - remains on 60 mg brilinta BID - will continue given her disease - continue ASA   LBBB - no syncope   Chronic systolic heart failure with improved EF - last echo 03/2023 with LVEF 60-65%, mildly elevated PADP, mild MR - GDMT: candesartan, coreg        Past Medical History:  Diagnosis Date   Anemia    Arthritis    CAD (coronary artery disease)    Cardiomyopathy    Carotid artery  occlusion    CHF (congestive heart failure) (HCC) 2007   COPD (chronic obstructive pulmonary disease) (HCC)    not on home o2   History of kidney stones    HTN (hypertension)    Hypercholesteremia    Pre-diabetes    Pulmonary nodule    PVD (peripheral vascular disease) (HCC)    Renal artery stenosis (HCC)     Past Surgical History:  Procedure Laterality Date   ABDOMINAL AORTOGRAM W/LOWER EXTREMITY N/A 05/11/2019   Procedure: ABDOMINAL AORTOGRAM W/LOWER EXTREMITY;  Surgeon: Iran Ouch, MD;  [facility]: [facility];  Service: Cardiovascular;  Laterality: N/A;   CARDIAC CATHETERIZATION     2007   CHOLECYSTECTOMY N/A 03/01/2019   Procedure: LAPAROSCOPIC CHOLECYSTECTOMY;  Surgeon: Axel Filler, MD;  Location: [facility] OR;  Service: General;  Laterality: N/A;   CORONARY STENT PLACEMENT      Successful stenting of the right renal ostium   complicated by distal wire perforation.  The wire perforation was  treated with embolization of the branch vessels involved.  Will check CT   scan to assess the right renal parenchymal hematoma.  She will be   maintained on both aspirin and Plavix.  Will place a Foley catheter to   avoid clotting since hematuria is likely.     EXTERNAL EAR SURGERY     as a child   ORIF HUMERUS FRACTURE Right 03/19/2023   Procedure: OPEN REDUCTION INTERNAL FIXATION (  ORIF) PROXIMAL HUMERUS FRACTURE;  Surgeon: Francena Hanly, MD;  Location: [facility];  Service: Orthopedics;  Laterality: Right;    PERIPHERAL VASCULAR INTERVENTION  05/11/2019   Procedure: PERIPHERAL VASCULAR INTERVENTION;  Surgeon: Iran Ouch, MD;  Location: [facility];  Service: Cardiovascular;;  Bilater Iliac    Current Medications: No outpatient medications have been marked as taking for the 03/31/23 encounter (Appointment) with Marcelino Duster, PA.     Allergies:   Lisinopril and Oxycodone   Social History   Socioeconomic History   Marital status: Divorced    Spouse  name: Not on file   Number of children: Not on file   Years of education: Not on file   Highest education level: Not on file  Occupational History   Not on file  Tobacco Use   Smoking status: Former    Current packs/day: 0.00    Average packs/day: 1 pack/day for 31.0 years (31.0 ttl pk-yrs)    Types: Cigarettes    Start date: 06/09/1974    Quit date: 06/09/2005    Years since quitting: 17.8   Smokeless tobacco: Never  Vaping Use   Vaping status: Never Used  Substance and Sexual Activity   Alcohol use: No    Alcohol/week: 0.0 standard drinks of alcohol   Drug use: No   Sexual activity: Not Currently    Birth control/protection: Post-menopausal  Other Topics Concern   Not on file  Social History Narrative   Not on file   Social Determinants of Health   Financial Resource Strain: Low Risk  (01/29/2023)   Received from [facility]   Overall Financial Resource Strain (CARDIA)    Difficulty of Paying Living Expenses: Not hard at all  Food Insecurity: No Food Insecurity (03/20/2023)   Hunger Vital Sign    Worried About Running Out of Food in the Last Year: Never true    Ran Out of Food in the Last Year: Never true  Transportation Needs: No Transportation Needs (03/20/2023)   PRAPARE - Administrator, Civil Service (Medical): No    Lack of Transportation (Non-Medical): No  Physical Activity: Insufficiently Active (01/29/2023)   Received from [facility]   Exercise Vital Sign    Days of Exercise per Week: 2 days    Minutes of Exercise per Session: 30 min  Stress: No Stress Concern Present (01/29/2023)   Received from [facility] of Occupational Health - Occupational Stress Questionnaire    Feeling of Stress : Not at all  Social Connections: Socially Integrated (01/29/2023)   Received from [facility]   Social Network    How would you rate your social network (family, work, friends)?: Good participation with social networks     Family  History: The patient's ***family history includes Cancer in her father; Coronary artery disease in an other family member; Heart disease (age of onset: 74) in her mother; Hyperlipidemia in her father and mother; Hypertension in her father and mother; Peripheral vascular disease in her mother.  ROS:   Please see the history of present illness.    *** All other systems reviewed and are negative.  EKGs/Labs/Other Studies Reviewed:    The following studies were reviewed today: ***      Recent Labs: 03/19/2023: ALT 13; B Natriuretic Peptide 65.6 03/20/2023: BUN 12; Creatinine, Ser 0.89; Hemoglobin 11.3; Platelets 300; Potassium 3.8; Sodium 137  Recent Lipid Panel    Component Value Date/Time   CHOL 135  01/05/2020 0000   TRIG 247 (H) 01/05/2020 0000   HDL 42 01/05/2020 0000   CHOLHDL 3.2 01/05/2020 0000   CHOLHDL 3.8 03/04/2016 0959   VLDL 32 (H) 03/04/2016 0959   LDLCALC 54 01/05/2020 0000     Risk Assessment/Calculations:   {Does this patient have ATRIAL FIBRILLATION?[phone]}  No BP recorded.  {Refresh Note OR Click here to enter BP  :1}***         Physical Exam:    VS:  There were no vitals taken for this visit.    Wt Readings from Last 3 Encounters:  03/19/23 198 lb (89.8 kg)  03/19/23 198 lb (89.8 kg)  10/22/21 191 lb 9.6 oz (86.9 kg)     GEN: *** Well nourished, well developed in no acute distress HEENT: Normal NECK: No JVD; No carotid bruits LYMPHATICS: No lymphadenopathy CARDIAC: ***RRR, no murmurs, rubs, gallops RESPIRATORY:  Clear to auscultation without rales, wheezing or rhonchi  ABDOMEN: Soft, non-tender, non-distended MUSCULOSKELETAL:  No edema; No deformity  SKIN: Warm and dry NEUROLOGIC:  Alert and oriented x 3 PSYCHIATRIC:  Normal affect   ASSESSMENT:    No diagnosis found. PLAN:    In order of problems listed above:  ***      {Are you ordering a CV Procedure (e.g. stress test, cath, DCCV, TEE, etc)?   Press F2        :[id number]}     Medication Adjustments/Labs and Tests Ordered: Current medicines are reviewed at length with the patient today.  Concerns regarding medicines are outlined above.  No orders of the defined types were placed in this encounter.  No orders of the defined types were placed in this encounter.   There are no Patient Instructions on file for this visit.   Signed, Marcelino Duster, PA  03/25/2023 2:52 PM    [facility]

## 2023-03-30 ENCOUNTER — Other Ambulatory Visit: Payer: Self-pay | Admitting: Cardiovascular Disease

## 2023-03-30 DIAGNOSIS — I739 Peripheral vascular disease, unspecified: Secondary | ICD-10-CM

## 2023-03-30 NOTE — Telephone Encounter (Signed)
Please contact pt for future appointment. Pt overdue for f/u. Pt needing refills.

## 2023-03-30 NOTE — Telephone Encounter (Signed)
Refill request

## 2023-03-31 ENCOUNTER — Ambulatory Visit: Payer: Medicare Other | Admitting: Physician Assistant

## 2023-03-31 ENCOUNTER — Other Ambulatory Visit: Payer: Self-pay

## 2023-03-31 MED ORDER — BRILINTA 60 MG PO TABS: 60.0000 mg | ORAL_TABLET | Freq: Two times a day (BID) | ORAL | 0 refills | Status: DC

## 2023-04-27 NOTE — Progress Notes (Deleted)
Cardiology Office Note:    Date:  04/27/2023   ID:  [name], DOB 1961/03/08, MRN [id number]  PCP:  [facility]   [facility] Providers Cardiologist:  Brenda Bears, MD { Click to update primary MD,subspecialty MD or APP then REFRESH:1}    Referring MD: [facility]   No chief complaint on file. ***  History of Present Illness:    Brenda [name] is a 62 y.o. female with a hx of PAD, CAD, chronic systolic heart failure with improved EF, COPD, prior tobacco use, HTN, HLD, and RAS s/p renal artery stenting 2008.   RAS stenting complicated by wire perforation treated with coils. ISR subsequently treated with covered stent. Carotid disease s/p bilateral CEA and right carotid to subclavian artery bypass. Angiography 05/2019 with patent renal artery stent and iliac artery stent but iliac artery disease progression treated with 2 additional stents.     She was hospitalized 03/2023 with acute respiratory failure with hypoxia in the setting of COPD and chronic diastolic heart failure.  She presented for proximal humeral fixation that morning and discharged home in the afternoon.  However, patient had a nonresponsive episode for 2 to 3 minutes and then return to normal self.  She was found to be hypoxic with EMS to the mid 80s started on 6L O2.  Neuroexam was normal when she arrived to the ER but continued to require 5 to 6 L of O2.  She was subsequently weaned off O2 and treated for UTI with ceftriaxone and discharged on Keflex.  Urine culture without any growth.  Last seen 11/2021 by Dr. Kirke [name]. She presents for cardiology follow-up.   PAD  RAS s/p stenting x 2 for ISR Iliac artery disease s/p multiple stents Carotid artery stenosis s/p bilateral CEA and distal common carotid artery bypass graft to right subclavian. - last dopplers were 02/2023 stable, repeat in 1 year   Hyperlipidemia with LDL goal < 55 Need updated lipid  panel Continue 20 mg crestor   Hypertension - candesartan and coreg    Left subclavian artery stenosis - left radial pulse intact   CAD - remains on 60 mg brilinta BID - will continue given her disease - continue ASA   LBBB - no syncope   Chronic systolic and diastolic heart failure with improved EF - last echo 03/2023 with LVEF 60-65%, mildly elevated PADP, mild MR - GDMT: candesartan, coreg         Past Medical History:  Diagnosis Date   Anemia    Arthritis    CAD (coronary artery disease)    Cardiomyopathy    Carotid artery occlusion    CHF (congestive heart failure) (HCC) 2007   COPD (chronic obstructive pulmonary disease) (HCC)    not on home o2   History of kidney stones    HTN (hypertension)    Hypercholesteremia    Pre-diabetes    Pulmonary nodule    PVD (peripheral vascular disease) (HCC)    Renal artery stenosis (HCC)     Past Surgical History:  Procedure Laterality Date   ABDOMINAL AORTOGRAM W/LOWER EXTREMITY N/A 05/11/2019   Procedure: ABDOMINAL AORTOGRAM W/LOWER EXTREMITY;  Surgeon: Iran Ouch, MD;  Location: [facility];  Service: Cardiovascular;  Laterality: N/A;   CARDIAC CATHETERIZATION     2007   CHOLECYSTECTOMY N/A 03/01/2019   Procedure: LAPAROSCOPIC CHOLECYSTECTOMY;  Surgeon: Axel Filler, MD;  Location: [facility] OR;  Service: General;  Laterality: N/A;  CORONARY STENT PLACEMENT      Successful stenting of the right renal ostium   complicated by distal wire perforation.  The wire perforation was  treated with embolization of the branch vessels involved.  Will check CT   scan to assess the right renal parenchymal hematoma.  She will be   maintained on both aspirin and Plavix.  Will place a Foley catheter to   avoid clotting since hematuria is likely.     EXTERNAL EAR SURGERY     as a child   ORIF HUMERUS FRACTURE Right 03/19/2023   Procedure: OPEN REDUCTION INTERNAL FIXATION (ORIF) PROXIMAL HUMERUS FRACTURE;  Surgeon:  Francena Hanly, MD;  Location: [facility];  Service: Orthopedics;  Laterality: Right;    PERIPHERAL VASCULAR INTERVENTION  05/11/2019   Procedure: PERIPHERAL VASCULAR INTERVENTION;  Surgeon: Iran Ouch, MD;  Location: [facility];  Service: Cardiovascular;;  Bilater Iliac    Current Medications: No outpatient medications have been marked as taking for the 04/29/23 encounter (Appointment) with Brenda Duster, PA.     Allergies:   Lisinopril and Oxycodone   Social History   Socioeconomic History   Marital status: Divorced    Spouse name: Not on file   Number of children: Not on file   Years of education: Not on file   Highest education level: Not on file  Occupational History   Not on file  Tobacco Use   Smoking status: Former    Current packs/day: 0.00    Average packs/day: 1 pack/day for 31.0 years (31.0 ttl pk-yrs)    Types: Cigarettes    Start date: 06/09/1974    Quit date: 06/09/2005    Years since quitting: 17.8   Smokeless tobacco: Never  Vaping Use   Vaping status: Never Used  Substance and Sexual Activity   Alcohol use: No    Alcohol/week: 0.0 standard drinks of alcohol   Drug use: No   Sexual activity: Not Currently    Birth control/protection: Post-menopausal  Other Topics Concern   Not on file  Social History Narrative   Not on file   Social Determinants of Health   Financial Resource Strain: Low Risk  (01/29/2023)   Received from [facility]   Overall Financial Resource Strain (CARDIA)    Difficulty of Paying Living Expenses: Not hard at all  Food Insecurity: No Food Insecurity (03/20/2023)   Hunger Vital Sign    Worried About Running Out of Food in the Last Year: Never true    Ran Out of Food in the Last Year: Never true  Transportation Needs: No Transportation Needs (03/20/2023)   PRAPARE - Administrator, Civil Service (Medical): No    Lack of Transportation (Non-Medical): No  Physical Activity: Insufficiently Active  (01/29/2023)   Received from [facility]   Exercise Vital Sign    Days of Exercise per Week: 2 days    Minutes of Exercise per Session: 30 min  Stress: No Stress Concern Present (01/29/2023)   Received from [facility] of Occupational Health - Occupational Stress Questionnaire    Feeling of Stress : Not at all  Social Connections: Socially Integrated (01/29/2023)   Received from [facility]   Social Network    How would you rate your social network (family, work, friends)?: Good participation with social networks     Family History: The patient's ***family history includes Cancer in her father; Coronary artery disease in an other family member;  Heart disease (age of onset: 68) in her mother; Hyperlipidemia in her father and mother; Hypertension in her father and mother; Peripheral vascular disease in her mother.  ROS:   Please see the history of present illness.    *** All other systems reviewed and are negative.  EKGs/Labs/Other Studies Reviewed:    The following studies were reviewed today: ***      Recent Labs: 03/19/2023: ALT 13; B Natriuretic Peptide 65.6 03/20/2023: BUN 12; Creatinine, Ser 0.89; Hemoglobin 11.3; Platelets 300; Potassium 3.8; Sodium 137  Recent Lipid Panel    Component Value Date/Time   CHOL 135 01/05/2020 0000   TRIG 247 (H) 01/05/2020 0000   HDL 42 01/05/2020 0000   CHOLHDL 3.2 01/05/2020 0000   CHOLHDL 3.8 03/04/2016 0959   VLDL 32 (H) 03/04/2016 0959   LDLCALC 54 01/05/2020 0000     Risk Assessment/Calculations:   {Does this patient have ATRIAL FIBRILLATION?:[phone]}  No BP recorded.  {Refresh Note OR Click here to enter BP  :1}***         Physical Exam:    VS:  There were no vitals taken for this visit.    Wt Readings from Last 3 Encounters:  03/19/23 198 lb (89.8 kg)  03/19/23 198 lb (89.8 kg)  10/22/21 191 lb 9.6 oz (86.9 kg)     GEN: *** Well nourished, well developed in no acute distress HEENT:  Normal NECK: No JVD; No carotid bruits LYMPHATICS: No lymphadenopathy CARDIAC: ***RRR, no murmurs, rubs, gallops RESPIRATORY:  Clear to auscultation without rales, wheezing or rhonchi  ABDOMEN: Soft, non-tender, non-distended MUSCULOSKELETAL:  No edema; No deformity  SKIN: Warm and dry NEUROLOGIC:  Alert and oriented x 3 PSYCHIATRIC:  Normal affect   ASSESSMENT:    No diagnosis found. PLAN:    In order of problems listed above:  ***      {Are you ordering a CV Procedure (e.g. stress test, cath, DCCV, TEE, etc)?   Press F2        :[id number]}    Medication Adjustments/Labs and Tests Ordered: Current medicines are reviewed at length with the patient today.  Concerns regarding medicines are outlined above.  No orders of the defined types were placed in this encounter.  No orders of the defined types were placed in this encounter.   There are no Patient Instructions on file for this visit.   Signed, Brenda Duster, PA  04/27/2023 11:09 AM    [facility]

## 2023-04-29 ENCOUNTER — Ambulatory Visit: Payer: Medicare Other | Admitting: Physician Assistant

## 2023-05-26 ENCOUNTER — Ambulatory Visit: Payer: Medicare Other | Admitting: General Practice

## 2023-06-07 ENCOUNTER — Other Ambulatory Visit (HOSPITAL_BASED_OUTPATIENT_CLINIC_OR_DEPARTMENT_OTHER): Payer: Self-pay | Admitting: Cardiovascular Disease

## 2023-06-08 ENCOUNTER — Encounter: Payer: Self-pay | Admitting: Cardiovascular Disease

## 2023-06-08 NOTE — Telephone Encounter (Signed)
Refill Request.  

## 2023-06-17 ENCOUNTER — Ambulatory Visit: Payer: Medicare Other | Admitting: Student

## 2023-06-18 ENCOUNTER — Emergency Department (HOSPITAL_COMMUNITY)
Admission: EM | Admit: 2023-06-18 | Discharge: 2023-06-18 | Disposition: A | Discharge status: Home / Self Care | Payer: No Typology Code available for payment source | Attending: Emergency Medicine | Admitting: Emergency Medicine

## 2023-06-18 ENCOUNTER — Emergency Department (HOSPITAL_COMMUNITY): Payer: No Typology Code available for payment source

## 2023-06-18 ENCOUNTER — Encounter (HOSPITAL_COMMUNITY): Payer: Self-pay | Admitting: Emergency Medicine

## 2023-06-18 ENCOUNTER — Other Ambulatory Visit: Payer: Self-pay

## 2023-06-18 DIAGNOSIS — I509 Heart failure, unspecified: Secondary | ICD-10-CM | POA: Diagnosis not present

## 2023-06-18 DIAGNOSIS — R079 Chest pain, unspecified: Secondary | ICD-10-CM | POA: Diagnosis present

## 2023-06-18 DIAGNOSIS — I2510 Atherosclerotic heart disease of native coronary artery without angina pectoris: Secondary | ICD-10-CM | POA: Insufficient documentation

## 2023-06-18 DIAGNOSIS — R202 Paresthesia of skin: Secondary | ICD-10-CM | POA: Insufficient documentation

## 2023-06-18 DIAGNOSIS — W19XXXA Unspecified fall, initial encounter: Secondary | ICD-10-CM | POA: Diagnosis not present

## 2023-06-18 DIAGNOSIS — I2584 Coronary atherosclerosis due to calcified coronary lesion: Secondary | ICD-10-CM | POA: Insufficient documentation

## 2023-06-18 DIAGNOSIS — J449 Chronic obstructive pulmonary disease, unspecified: Secondary | ICD-10-CM | POA: Insufficient documentation

## 2023-06-18 DIAGNOSIS — I110 Hypertensive heart disease with heart failure: Secondary | ICD-10-CM | POA: Insufficient documentation

## 2023-06-18 DIAGNOSIS — M542 Cervicalgia: Secondary | ICD-10-CM

## 2023-06-18 DIAGNOSIS — R0789 Other chest pain: Secondary | ICD-10-CM

## 2023-06-18 LAB — CBC WITH DIFFERENTIAL/PLATELET
Abs Immature Granulocytes: 0.11 10*3/uL — ABNORMAL HIGH (ref 0.00–0.07)
Basophils Absolute: 0.1 10*3/uL (ref 0.0–0.1)
Basophils Relative: 1 %
Eosinophils Absolute: 0.4 10*3/uL (ref 0.0–0.5)
Eosinophils Relative: 3 %
HCT: 40.2 % (ref 36.0–46.0)
Hemoglobin: 12.6 g/dL (ref 12.0–15.0)
Immature Granulocytes: 1 %
Lymphocytes Relative: 17 %
Lymphs Abs: 2.6 10*3/uL (ref 0.7–4.0)
MCH: 26.4 pg (ref 26.0–34.0)
MCHC: 31.3 g/dL (ref 30.0–36.0)
MCV: 84.1 fL (ref 80.0–100.0)
Monocytes Absolute: 1 10*3/uL (ref 0.1–1.0)
Monocytes Relative: 7 %
Neutro Abs: 11.4 10*3/uL — ABNORMAL HIGH (ref 1.7–7.7)
Neutrophils Relative %: 71 %
Platelets: 465 10*3/uL — ABNORMAL HIGH (ref 150–400)
RBC: 4.78 MIL/uL (ref 3.87–5.11)
RDW: 15.1 % (ref 11.5–15.5)
WBC: 15.6 10*3/uL — ABNORMAL HIGH (ref 4.0–10.5)
nRBC: 0 % (ref 0.0–0.2)

## 2023-06-18 LAB — BASIC METABOLIC PANEL
Anion gap: 11 (ref 5–15)
BUN: 13 mg/dL (ref 8–23)
CO2: 23 mmol/L (ref 22–32)
Calcium: 9.3 mg/dL (ref 8.9–10.3)
Chloride: 105 mmol/L (ref 98–111)
Creatinine, Ser: 0.86 mg/dL (ref 0.44–1.00)
GFR, Estimated: >60 mL/min (ref >60)
Glucose, Bld: 119 mg/dL — ABNORMAL HIGH (ref 70–99)
Potassium: 3.7 mmol/L (ref 3.5–5.1)
Sodium: 139 mmol/L (ref 135–145)

## 2023-06-18 MED ORDER — IOHEXOL 350 MG/ML SOLN
75.0000 mL | Freq: Once | INTRAVENOUS | Status: AC | PRN
  Administered 2023-06-18: 75 mL via INTRAVENOUS

## 2023-06-18 NOTE — Discharge Instructions (Addendum)
 Your CT scans and MRI did not show a stroke or broken ribs. Please take your home medication for pain and follow with your Cardiologist in the coming week about the calcium buildup in the arteries of the heart.

## 2023-06-18 NOTE — ED Provider Triage Note (Signed)
 Emergency Medicine Provider Triage Evaluation Note  Brenda [name] , a 63 y.o. female  was evaluated in triage.  Pt complains of multiple complaints.  Complains of facial numbness to the right side.  Now resolved.  Also complains of right-sided neck mass that has been there for the past 2 to 3 days that she thinks is progressively worsening.  No difficulty swallowing.  She did have a fall few weeks ago.  She fractured her right proximal humerus.  During the fall she states she injured her right rib cage.  Review of Systems  Positive: As above Negative: As above  Physical Exam  BP 117/61 (BP Location: Left Arm)   Pulse 73   Temp 98.4 F (36.9 C)   Resp 18   SpO2 95%  Gen:   Awake, no distress   Resp:  Normal effort  MSK:   Moves extremities without difficulty  Other:    Medical Decision Making  Medically screening exam initiated at 3:47 PM.  Appropriate orders placed.  OTTIS SARNOWSKI was informed that the remainder of the evaluation will be completed by another provider, this initial triage assessment does not replace that evaluation, and the importance of remaining in the ED until their evaluation is complete.     Hildegard Loge, PA-C 06/18/23 [phone]

## 2023-06-18 NOTE — ED Provider Notes (Signed)
 Emergency Department Provider Note   I have reviewed the triage vital signs and the nursing notes.   HISTORY  Chief Complaint Neck Pain and Facial numbness   HPI Brenda [name] is a 63 y.o. female with past history of below presents to the emergency department with numbness to the right cheek.  Patient tells me that 2 weeks ago she had a fall landing on both elbows.  She had recently had right shoulder surgery and so was trying to protect that shoulder during the fall.  She noticed some discomfort in her neck, shoulder, right ribs and abdomen.  She thinks that around this time she developed the numbness to the right cheek and right lateral eye.  No vision changes or speech disturbance.  No severe headaches.  No numbness into the arm/leg.  No weakness or gait instability.    Past Medical History:  Diagnosis Date   Anemia    Arthritis    CAD (coronary artery disease)    Cardiomyopathy    Carotid artery occlusion    CHF (congestive heart failure) (HCC) 2007   COPD (chronic obstructive pulmonary disease) (HCC)    not on home o2   History of kidney stones    HTN (hypertension)    Hypercholesteremia    Pre-diabetes    Pulmonary nodule    PVD (peripheral vascular disease) (HCC)    Renal artery stenosis (HCC)     Review of Systems  Constitutional: No fever/chills Cardiovascular: Denies chest pain. Respiratory: Denies shortness of breath. Gastrointestinal: No abdominal pain.  No nausea, no vomiting.   Musculoskeletal: Negative for back pain. Positive right shoulder and chest wall pain.  Skin: Negative for rash. Neurological: Negative for headaches or weakness. Positive right face numbness.    ____________________________________________   PHYSICAL EXAM:  VITAL SIGNS: ED Triage Vitals  Encounter Vitals Group     BP 06/18/23 1539 117/61     Pulse Rate 06/18/23 1539 73     Resp 06/18/23 1539 18     Temp 06/18/23 1539 98.4 F (36.9 C)     Temp src --      SpO2  06/18/23 1539 95 %   Constitutional: Alert and oriented. Well appearing and in no acute distress. Eyes: Conjunctivae are normal. PERRL. Head: Atraumatic. Nose: No congestion/rhinnorhea. Mouth/Throat: Mucous membranes are moist.   Neck: No stridor.   Cardiovascular: Normal rate, regular rhythm. Good peripheral circulation. Grossly normal heart sounds.   Respiratory: Normal respiratory effort.  No retractions. Lungs CTAB. Gastrointestinal: Soft and nontender. No distention.  Musculoskeletal: No lower extremity tenderness nor edema. No gross deformities of extremities.  Pain with range of motion of the right shoulder status post surgery.  Incision is well-appearing without large effusion or warmth to touch.  Neurologic:  Normal speech and language.  No facial asymmetry.  Slight decrease sensation to the right cheek compared to the left.  Sensation intact over the right forehead and jaw.  5/5 strength in the bilateral upper and lower extremities with normal sensation throughout. Skin:  Skin is warm, dry and intact. No rash noted.  ____________________________________________   LABS (all labs ordered are listed, but only abnormal results are displayed)  Labs Reviewed  CBC WITH DIFFERENTIAL/PLATELET - Abnormal; Notable for the following components:      Result Value   WBC 15.6 (*)    Platelets 465 (*)    Neutro Abs 11.4 (*)    Abs Immature Granulocytes 0.11 (*)    All other components within  normal limits  BASIC METABOLIC PANEL - Abnormal; Notable for the following components:   Glucose, Bld 119 (*)    All other components within normal limits   ____________________________________________  RADIOLOGY  MR BRAIN WO CONTRAST Result Date: 06/18/2023 CLINICAL DATA:  Initial evaluation for acute neuro deficit, stroke suspected. EXAM: MRI HEAD WITHOUT CONTRAST TECHNIQUE: Multiplanar, multiecho pulse sequences of the brain and surrounding structures were obtained without intravenous contrast.  COMPARISON:  CT from earlier the same day. FINDINGS: Brain: Cerebral volume within normal limits. Mild chronic microischemic disease. Small remote right cerebellar infarct noted. No acute or subacute ischemia. Gray-white matter differentiation maintained. No acute or chronic intracranial blood products. No mass lesion, midline shift or mass effect. No hydrocephalus or extra-axial fluid collection. Pituitary gland within normal limits. Vascular: Major intracranial vascular flow voids are maintained. Skull and upper cervical spine: Craniocervical junction within normal limits. Bone marrow signal intensity normal. No scalp soft tissue abnormality. Sinuses/Orbits: Globes orbital soft tissues within normal limits. Paranasal sinuses are clear. Small right mastoid effusion noted, of doubtful significance. Other: None. IMPRESSION: 1. No acute intracranial abnormality. 2. Mild chronic microvascular ischemic disease with small remote right cerebellar infarct. Electronically Signed   By: Morene Hoard M.D.   On: 06/18/2023 21:42   CT Head Wo Contrast Result Date: 06/18/2023 CLINICAL DATA:  Transient ischemic attack.  Non pulsatile neck mass. EXAM: CT HEAD WITHOUT CONTRAST TECHNIQUE: Contiguous axial images were obtained from the base of the skull through the vertex without intravenous contrast. RADIATION DOSE REDUCTION: This exam was performed according to the departmental dose-optimization program which includes automated exposure control, adjustment of the mA and/or kV according to patient size and/or use of iterative reconstruction technique. COMPARISON:  CT head 06/29/2018 FINDINGS: Brain: No intracranial hemorrhage, mass effect, or evidence of acute infarct. No hydrocephalus. No extra-axial fluid collection. Vascular: No hyperdense vessel or unexpected calcification. Skull: No fracture or focal lesion. Sinuses/Orbits: No acute finding. Other: None. IMPRESSION: No acute intracranial abnormality. Electronically  Signed   By: Norman Gatlin M.D.   On: 06/18/2023 18:59   CT Soft Tissue Neck W Contrast Result Date: 06/18/2023 CLINICAL DATA:  Non pulsatile neck mass EXAM: CT NECK WITH CONTRAST TECHNIQUE: Multidetector CT imaging of the neck was performed using the standard protocol following the bolus administration of intravenous contrast. RADIATION DOSE REDUCTION: This exam was performed according to the departmental dose-optimization program which includes automated exposure control, adjustment of the mA and/or kV according to patient size and/or use of iterative reconstruction technique. CONTRAST:  75mL OMNIPAQUE  IOHEXOL  350 MG/ML SOLN COMPARISON:  07/09/2006 CTA head and neck, no prior CT neck available FINDINGS: Pharynx and larynx: Evaluation is somewhat limited by closed glottis. Within this limitation, no acute finding. No evidence of mass or swelling. Salivary glands: No inflammation, mass, or stone. The right submandibular gland is somewhat larger than the left submandibular gland, but these are within normal limits. Thyroid: Normal. Lymph nodes: None enlarged or abnormal density. Vascular: Aortic atherosclerosis. 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 Atherosclerotic disease in the carotid arteries. Limited intracranial: No acute finding. Visualized orbits: Negative. Mastoids and visualized paranasal sinuses: Clear. Skeleton: No acute or aggressive process. Upper chest: For findings in the thorax, please see same day CT chest. Other: No radiopaque marker is seen to denote the area of interest. No abnormality is seen in the superficial soft tissues of the neck. IMPRESSION: 1. No  radiopaque marker is seen to denote the area of interest. No abnormality is seen in the superficial soft tissues of the neck. 2. No acute process in the neck. 3. Aortic atherosclerosis. Aortic Atherosclerosis (ICD10-I70.0).  Electronically Signed   By: Donald Campion M.D.   On: 06/18/2023 18:56   CT Chest W Contrast Result Date: 06/18/2023 CLINICAL DATA:  Blunt chest trauma. EXAM: CT CHEST WITH CONTRAST TECHNIQUE: Multidetector CT imaging of the chest was performed during intravenous contrast administration. RADIATION DOSE REDUCTION: This exam was performed according to the departmental dose-optimization program which includes automated exposure control, adjustment of the mA and/or kV according to patient size and/or use of iterative reconstruction technique. CONTRAST:  75mL OMNIPAQUE  IOHEXOL  350 MG/ML SOLN COMPARISON:  CTA 03/19/2023.  X-ray earlier 06/18/2023. FINDINGS: Cardiovascular: Heart is nonenlarged. No pericardial effusion. Coronary artery calcifications are seen. The thoracic aorta has a normal course and caliber with some calcified plaque. 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 Calcifications also seen along the course of the great vessels. Poor visualization of the left vertebral artery. Please correlate for vessel occlusion. Please see separate CT scan of the neck. Mediastinum/Nodes: Preserved thyroid gland. Normal caliber thoracic esophagus. Slight wall thickening along the course of the distal thoracic esophagus, unchanged from previous. Nonspecific. No discrete abnormal lymph node enlargement identified in the axillary regions, hilum or mediastinum. Lungs/Pleura: Advanced centrilobular and paraseptal emphysematous changes. Breathing motion. No consolidation, pneumothorax or effusion. Bilateral apical pleural thickening identified. There is a spiculated nodule right lower lobe measuring 6 mm on series 4, image 100. This has likely present in retrospect on the previous examination but that study had significant motion. Going back to an older study from 2021 abdomen pelvis CT nodule is present measuring 5 x 3 mm at this time in this location. Slowly increasing in size favoring more of a benign process with the slow  interval growth. Upper Abdomen: Fatty liver infiltration seen in the upper abdomen. The adrenal glands are preserved. Musculoskeletal: Scattered mild degenerative changes along the spine. There is streak artifact related to the fixation hardware about the right proximal humerus. IMPRESSION: Advanced emphysematous lung changes. No consolidation, pneumothorax or effusion. No obvious rib fracture. Coronary artery calcifications are seen. Please correlate for other coronary risk factors. Of note the left vertebral artery is not well seen and could be occluded. Please see separate neck CT examination. 6 mm right lower lobe lung nodule. Smaller on the study of 2021. Recommend simple follow up in 6-12 months. Aortic Atherosclerosis (ICD10-I70.0) and Emphysema (ICD10-J43.9). Electronically Signed   By: Ranell Bring M.D.   On: 06/18/2023 18:41   DG Ribs Unilateral W/Chest Right Result Date: 06/18/2023 CLINICAL DATA:  Fall right-sided rib pain EXAM: RIGHT RIBS AND CHEST - 3+ VIEW COMPARISON:  07/09/2019 FINDINGS: Single-view chest demonstrates emphysema and diffuse chronic bronchitic change. No pleural effusion or pneumothorax. Stable cardiomediastinal silhouette with aortic atherosclerosis. Hardware in the right humerus. Right rib series demonstrates no definite acute displaced right rib fracture IMPRESSION: No definite acute displaced right rib fracture. Emphysema and chronic bronchitic change. Electronically Signed   By: Luke Bun M.D.   On: 06/18/2023 16:20    ____________________________________________   PROCEDURES  Procedure(s) performed:   Procedures  None  ____________________________________________   INITIAL IMPRESSION / ASSESSMENT AND PLAN / ED COURSE  Pertinent labs & imaging results that were available during my care of the patient were reviewed by me and considered in my medical decision making (see chart for details).  This patient is Presenting for Evaluation of numbness, which does  require a range of treatment options, and is a complaint that involves a high risk of morbidity and mortality.  The Differential Diagnoses include CVA, contusion, fracture, dislocation, septic joint, etc.  Critical Interventions-    Medications  iohexol  (OMNIPAQUE ) 350 MG/ML injection 75 mL (75 mLs Intravenous Contrast Given 06/18/23 1825)    Reassessment after intervention: symptoms improved.    Clinical Laboratory Tests Ordered, included CBC with leukocytosis to 15.6 without clear source of infection.  No anemia.  Basic metabolic panel shows normal creatinine.   Radiologic Tests Ordered, included CT head, neck and chest. I independently interpreted the images and agree with radiology interpretation.   Cardiac Monitor Tracing which shows NSR.    Social Determinants of Health Risk patient is not an active smoker.   Medical Decision Making: Summary:  Patient presents to the emergency department valuation of pain after fall but also numbness to the right cheek.  She is outside any window for stroke intervention or to activate a code stroke.  Small patch of face seems affected with subjective symptoms.  Will follow the CT imaging ordered from triage.  Have added the CT of the right chest with some continued rib pain after fall.  No fracture or pneumothorax seen on x-ray.  Reevaluation with update and discussion with patient.  CT with no acute abnormality.  The possible vascular abnormality in the neck seen on CT chest shows an anomalous origin of the artery on neck CT. No CP symptoms to prompt ACS workup. Any CP on exam is reproducible to palpation.   Considered admission but no evidence of acute traumatic injury or CVA. Plan for discharge with close PCP and Neurology follow up.   Patient's presentation is most consistent with acute presentation with potential threat to life or bodily function.   Disposition: discharge  ____________________________________________  FINAL CLINICAL  IMPRESSION(S) / ED DIAGNOSES  Final diagnoses:  Paresthesias  Neck pain  Chest wall pain  Coronary artery calcification    Note:  This document was prepared using Dragon voice recognition software and may include unintentional dictation errors.  Fonda Law, MD, [facility]    Ainsley Sanguinetti, Fonda MATSU, MD 06/19/23 [phone]

## 2023-06-18 NOTE — ED Notes (Signed)
 Pt returned from CT

## 2023-06-18 NOTE — ED Triage Notes (Signed)
 Pt states she broke her right shoulder a month ago, 2 weeks ago she fell on her bilateral elbows and her right side.  For several days she has had right side pain and swelling in her right neck with facial numbness on that side.  Pt states she was seen at urgent care and had an abdominal x-ray and was told to come to the ED if the pain did not resolve.

## 2023-06-27 NOTE — Progress Notes (Unsigned)
Cardiology Office Note:    Date:  07/09/2023   ID:  Brenda [name], DOB 1961/05/11, MRN [id number]  PCP:  [facility]  Cardiologist:  Lorine Bears, MD     Referring MD: [facility]   Chief Complaint: follow-up of CAD, CHF, and PAD  History of Present Illness:    Brenda [name] is a 63 y.o. female with a history of mild non-obstructive CAD on remote cardiac catheterization in 05/2006, non-ischemic cardiomyopathy/ chronic HFrEF with EF as low as 35-40% in the remote past with subsequent normalization, extensive PAD with known renal artery stenosis (s/p stenting of right renal artery in 07/2006 complicated by wire perforation which was treated with coil embolization and then stenting of left renal artery and angioplasty of right renal artery in 09/2007 ), carotid/ subclavian artery stenosis (s/p right CEA in 07/2006, left CEA in 05/2007, and right carotid to subclavian artery bypass in 05/2008), and iliac/ SFA disease (s/p kissing balloon bifurcation stenting of right iliac artery in 12/2009 and stenting bilateral common iliac arteries in 05/2019), CVA, COPD, hypertension, hyperlipidemia, prediabetes, and anemia who is followed by Dr. Kirke Corin and presents today for routine follow-up.   Patient has a complex history as detailed above. He was admitted in 05/2006 with acute CHF and was found to have an EF of 35-40%. LHC at that time showed mild non-obstructive disease. EF subsequently normalized. He also has a history of extensive PAD involving the carotid/ subclavian arteries, renal arteries, and lower extremity arteries. He has undergone multiple interventions in the past as detailed above. It does not look like he has had any ischemic evaluation since the heart cath in 2007. Last renal artery ultrasound in 02/2021 showed >60% stenosis of right renal artery, 1-59% stenosis of left rena artery, and 70-99% stenosis of superior mesenteric artery. Last carotid  ultrasound in 07/2022 showed patent bilateral ICAs without evidence of significant stenosis as well as a stenotic right subclavian artery and turbulent flow in left subclavian artery with known left subclavian steal (30 mmHg pressure difference between the arms). Last lower extremity artery ultrasounds in 02/2023 showed stable >50% stenosis of bilateral common and external iliac arteries. ABIs were indicative of moderate disease on the right  (0.74) and mild disease on the left (0.94).   She was last seen in 10/2021 at which time she was doing well with no chest pain or shortness of breath and only minimal claudication.  She was admitted in 03/2023 for acute hypoxic respiratory failure following anesthesia during shoulder surgery. She initially required 10L of O2 via nasal cannula. Chest CTA was negative for PE and did not show any findings consistent with pneumonia or CHF. Echo showed LVEF of 60-65% with mild LVH, normal RV size and function, mildly elevated PASP of 44.2 mmHg, and mild MR. Etiology of hypoxia was unclear but it is worth nothing she has had the same thing happen in the past after anesthesia. She was discharged on 3-4L of O2.  She was recently seen in the ED on 06/18/2023 for neck pain and facial numbness. Head/neck CT and brain MRI showed no acute findings but brain MRI did showed a small remote right cerebellar infarct. Patient was felt to be safe for discharge but was instructed to follow-up with PCP and Neurology.  Patient presents today for follow-up.  She is doing well from a cardiac standpoint.  She denies any chest pain.  She describes some mild shortness of breath if she is  walking quickly but this is not new and is stable.  No other shortness of breath.  No orthopnea, PND, or edema.  She is still on 2 L of O2 at night after hospitalization in October.  She states she has previously been tested for sleep apnea but this was several years ago.  She does report snoring and some daytime  fatigue.  She denies any palpitations.  She notes mild lightheadedness/dizziness if she stands up quickly but this resolves quickly.  No falls or syncope.  She describes some claudication if she walks quickly but this is stable.  EKGs/Labs/Other Studies Reviewed:    The following studies were reviewed:  Renal Artery Dopplers 02/12/2021: Summary:  Renal:  - Right: Evidence of a > 60% stenosis of the right renal artery, low-end of range based on RAR. RRV flow present. Normal size right kidney. Abnormal right Resistive Index. Normal cortical  thickness of right kidney.  - Left:  1-59% stenosis of the left renal artery. LRV flow present. Normal size of left kidney. Abnormal left Resisitve Index. Normal cortical thickness of the left kidney.   Mesenteric:  - Normal Celiac artery findings.  - 70 to 99% stenosis in the superior mesenteric artery.  _______________  Aorta/ IVC/ Iliac Ultrasounds and ABI/ TBI 02/18/2023: Summary: Unable to accurately visualize stent struts, therefore all velocities are  listed in the native table. Essentially stable >50% stenosis of the  bilateral common and external iliac arteries.   ABI/ TBI Summary: Right: Resting right ankle-brachial index indicates moderate right lower  extremity arterial disease. The right toe-brachial index is abnormal.   Left: Resting left ankle-brachial index indicates mild left lower  extremity arterial disease. The left toe-brachial index is normal.  _______________  Echocardiogram 03/20/2023: Impressions: 1. Left ventricular ejection fraction, by estimation, is 60 to 65%. The  left ventricle has normal function. The left ventricle has no regional  wall motion abnormalities. There is mild left ventricular hypertrophy.  Left ventricular diastolic parameters  were normal.   2. Right ventricular systolic function is normal. The right ventricular  size is normal. There is mildly elevated pulmonary artery systolic  pressure. The  estimated right ventricular systolic pressure is 44.2 mmHg.   3. The mitral valve is degenerative. Mild mitral valve regurgitation. No  evidence of mitral stenosis.   4. The aortic valve is grossly normal. Aortic valve regurgitation is not  visualized. Aortic valve sclerosis is present, with no evidence of aortic  valve stenosis.   5. Aortic Proximal ascending aorta is within normal limits.   6. The inferior vena cava is dilated in size with <50% respiratory  variability, suggesting right atrial pressure of 15 mmHg.   EKG:  EKG not ordered today.  Recent Labs: 03/19/2023: ALT 13; B Natriuretic Peptide 65.6 06/18/2023: BUN 13; Creatinine, Ser 0.86; Hemoglobin 12.6; Platelets 465; Potassium 3.7; Sodium 139  Recent Lipid Panel    Component Value Date/Time   CHOL 135 01/05/2020 0000   TRIG 247 (H) 01/05/2020 0000   HDL 42 01/05/2020 0000   CHOLHDL 3.2 01/05/2020 0000   CHOLHDL 3.8 03/04/2016 0959   VLDL 32 (H) 03/04/2016 0959   LDLCALC 54 01/05/2020 0000    Physical Exam:    Vital Signs: BP 102/88 (BP Location: Left Arm, Patient Position: Sitting, Cuff Size: Normal)   Pulse 85   Ht 5\' 5"  (1.651 m)   Wt 193 lb (87.5 kg)   SpO2 91%   BMI 32.12 kg/m     Wt Readings  from Last 3 Encounters:  07/09/23 193 lb (87.5 kg)  03/19/23 198 lb (89.8 kg)  03/19/23 198 lb (89.8 kg)     General: 63 y.o. Caucasian female in no acute distress. HEENT: Normocephalic and atraumatic. Sclera clear.  Neck: Supple. Bilateral carotid bruits (right > left). No JVD. Heart:  RRR. Soft I-II/VI systolic murmur.  Lungs: No increased work of breathing. Clear to ausculation bilaterally. No wheezes, rhonchi, or rales.  Abdomen: Soft, non-distended, and non-tender to palpation.  Extremities: No lower extremity edema.  Radial and posterior tibial pulses 2+ and equal bilaterally. Skin: Warm and dry. Neuro: No focal deficits. Psych: Normal affect. Responds appropriately.   Assessment:    1. Coronary artery  disease involving native coronary artery of native heart without angina pectoris   2. Chronic HFrEF with recovered EF   3. Non-ischemic cardiomyopathy (HCC)   4. Mitral valve insufficiency, unspecified etiology   5. PAD (peripheral artery disease) (HCC)   6. Primary hypertension   7. Hyperlipidemia, unspecified hyperlipidemia type     Plan:    Non-Obstructive CAD Noted on remote cardiac catheterization in 05/2006. - No chest pain.  - Currently on DAPT with Aspirin 81mg  daily and Brilinta 60mg  twice daily for extensive PAD. - Continue statin.  Chronic HFrEF with Recovered EF Non-Ischemic Cardiomyopathy Initially diagnosed in 2007. EF was 35-40% at that time but has subsequently normalized. Last Echo in 03/2023 showed LVEF of 60-65% with mild LVH, normal RV size and function, mildly elevated PASP of 44.2 mmHg, and mild MR. - Euvolemic on exam.  - Continue Candesartan 16mg  daily. - Continue Coreg 6.25mg  twice daily.   Mild Mitral Regurgitation Noted on Echo in 03/2023.  - Can continue routine surveillance. Consider repeat Echo in 3 years (or sooner based on symptoms).   PAD Patient has extensive PAD involving the carotid/ subclavian arteries, renal arteries, and iliac/ SFA arteries and has had multiple interventions as detailed in HPI.  - Continue DAPT with Aspirin 81mg  daily and Brilinta 60mg  twice daily. - Continue statin.  - Plan is for repeat carotid ultrasounds in 07/2023 and repeat aorta/IVC/iliac arteries and ABIs in 02/2024. These have already been ordered. Will have her scheduled carotid doppler when she leaves the office today. - Will also order repeat renal artery dopplers as she has not had this done since 2022.   Hypertension BP well controlled.  - Continue medications for CHF as above.  Hyperlipidemia Lipid panel in 01/2023: Total Cholesterol 145, Triglycerides 137, HDL 51, LDL 70. LDL goal <55 given CAD and extensive PAD. - Will increase Crestor from 20mg  to 40mg   daily. - Will repeat lipid panel and LFTs in 2-3 months.   Disposition: Follow-up with Dr. Kirke Corin in 6 months.    Signed, Brenda Parker, Brenda [name]  07/09/2023 1:14 PM    [facility]

## 2023-07-09 ENCOUNTER — Encounter: Payer: Self-pay | Admitting: Student

## 2023-07-09 ENCOUNTER — Ambulatory Visit: Payer: No Typology Code available for payment source | Attending: Student | Admitting: Student

## 2023-07-09 VITALS — BP 102/88 | HR 85 | Ht 65.0 in | Wt 193.0 lb

## 2023-07-09 DIAGNOSIS — I739 Peripheral vascular disease, unspecified: Secondary | ICD-10-CM

## 2023-07-09 DIAGNOSIS — I2510 Atherosclerotic heart disease of native coronary artery without angina pectoris: Secondary | ICD-10-CM | POA: Diagnosis not present

## 2023-07-09 DIAGNOSIS — I10 Essential (primary) hypertension: Secondary | ICD-10-CM

## 2023-07-09 DIAGNOSIS — I5022 Chronic systolic (congestive) heart failure: Secondary | ICD-10-CM | POA: Diagnosis not present

## 2023-07-09 DIAGNOSIS — E785 Hyperlipidemia, unspecified: Secondary | ICD-10-CM

## 2023-07-09 DIAGNOSIS — I428 Other cardiomyopathies: Secondary | ICD-10-CM | POA: Diagnosis not present

## 2023-07-09 DIAGNOSIS — I340 Nonrheumatic mitral (valve) insufficiency: Secondary | ICD-10-CM

## 2023-07-09 MED ORDER — ROSUVASTATIN CALCIUM 40 MG PO TABS: 40.0000 mg | ORAL_TABLET | Freq: Every day | ORAL | 6 refills | Status: DC

## 2023-07-09 NOTE — Patient Instructions (Signed)
Medication Instructions:  INCREASE CRESTOR 40MG  *If you need a refill on your cardiac medications before your next appointment, please call your pharmacy*  Lab Work: FASTING LIPID AND LFT IN 3-2 MONTHS If you have labs (blood work) drawn today and your tests are completely normal, you will receive your results only by:  MyChart Message (if you have MyChart) OR  A paper copy in the mail If you have any lab test that is abnormal or we need to change your treatment, we will call you to review the results.  Testing/Procedures: SCHEDULE YOUR CAROTID ULTRASOUND AND RENAL ULTRASOUND READ [facility] INSTRUCTIONS BELOW-SLEEP TEST   Follow-Up: At [facility], you and your health needs are our priority.  As part of our continuing mission to provide you with exceptional heart care, we have created designated Provider Care Teams.  These Care Teams include your primary Cardiologist (physician) and Advanced Practice Providers (APPs -  Physician Assistants and Nurse Practitioners) who all work together to provide you with the care you need, when you need it.  Your next appointment:   6 month(s)  Provider:   Lorine Bears, MD     Other Instructions WatchPAT?  Is a FDA cleared portable home sleep study test that uses a watch and 3 points of contact to monitor 7 different channels, including your heart rate, oxygen saturations, body position, snoring, and chest motion.  The study is easy to use from the comfort of your own home and accurately detect sleep apnea.  Before bed, you attach the chest sensor, attached the sleep apnea bracelet to your nondominant hand, and attach the finger probe.  After the study, the raw data is downloaded from the watch and scored for apnea events.   For more information: [email or web address]  Patient Testing Instructions:  Do not put battery into the device until bedtime when you are ready to begin the test. Please call the support number if you  need assistance after following the instructions below: 24 hour support line- [phone] or [facility] support at [phone] (option 2)  Download the IntelWatchPAT One" app through the google play store or App Store  Be sure to turn on or enable access to bluetooth in settlings on your smartphone/ device  Make sure no other bluetooth devices are on and within the vicinity of your smartphone/ device and WatchPAT watch during testing.  Make sure to leave your smart phone/ device plugged in and charging all night.  When ready for bed:  Follow the instructions step by step in the WatchPAT One App to activate the testing device. For additional instructions, including video instruction, visit the WatchPAT One video on Youtube. You can search for WatchPat One within Youtube (video is 4 minutes and 18 seconds) or enter: https://youtube/[email or web address]?[email or web address] Please note: You will be prompted to enter a Pin to connect via bluetooth when starting the test. The PIN will be assigned to you when you receive the test.  The device is disposable, but it recommended that you retain the device until you receive a call letting you know the study has been received and the results have been interpreted.  We will let you know if the study did not transmit to [place] properly after the test is completed. You do not need to call us to confirm the receipt of the test.  Please complete the test within 48 hours of receiving PIN.   Frequently Asked Questions:  What is Watch Dennie Bible one?  A single use fully disposable home sleep apnea  testing device and will not need to be returned after completion.  What are the requirements to use WatchPAT one?  The be able to have a successful watchpat one sleep study, you should have your Watch pat one device, your smart phone, watch pat one app, your PIN number and Internet access What type of phone do I need?  You should have a smart phone that uses Android 5.1 and above or any Iphone with IOS 10  and above How can I download the WatchPAT one app?  Based on your device type search for WatchPAT one app either in google play for android devices or APP store for Iphone's Where will I get my PIN for the study?  Your PIN will be provided by your physician's office. It is used for authentication and if you lose/forget your PIN, please reach out to your providers office.  I do not have Internet at home. Can I do WatchPAT one study?  WatchPAT One needs Internet connection throughout the night to be able to transmit the sleep data. You can use your home/local internet or your cellular's data package. However, it is always recommended to use home/local Internet. It is estimated that between 20MB-30MB will be used with each study.However, the application will be looking for space in the phone to start the study.  What happens if I lose internet or bluetooth connection?  During the internet disconnection, your phone will not be able to transmit the sleep data. All the data, will be stored in your phone. As soon as the internet connection is back on, the phone will being sending the sleep data. During the bluetooth disconnection, WatchPAT one will not be able to to send the sleep data to your phone. Data will be kept in the [facility] one until two devices have bluetooth connection back on. As soon as the connection is back on, WatchPAT one will send the sleep data to the phone.  How long do I need to wear the WatchPAT one?  After you start the study, you should wear the device at least 6 hours.  How far should I keep my phone from the device?  During the night, your phone should be within 15 feet.  What happens if I leave the room for restroom or other reasons?  Leaving the room for any reason will not cause any problem. As soon as your get back to the room, both devices will reconnect and will continue to send the sleep data. Can I use my phone during the sleep study?  Yes, you can use your phone as  usual during the study. But it is recommended to put your watchpat one on when you are ready to go to bed.  How will I get my study results?  A soon as you completed your study, your sleep data will be sent to the provider. They will then share the results with you when they are ready.

## 2023-07-20 ENCOUNTER — Other Ambulatory Visit (HOSPITAL_BASED_OUTPATIENT_CLINIC_OR_DEPARTMENT_OTHER): Payer: Self-pay | Admitting: Cardiovascular Disease

## 2023-07-23 ENCOUNTER — Other Ambulatory Visit (HOSPITAL_COMMUNITY): Payer: Self-pay | Admitting: Cardiovascular Disease

## 2023-07-23 DIAGNOSIS — I6522 Occlusion and stenosis of left carotid artery: Secondary | ICD-10-CM

## 2023-08-03 ENCOUNTER — Ambulatory Visit (HOSPITAL_COMMUNITY)
Admission: RE | Admit: 2023-08-03 | Discharge: 2023-08-03 | Disposition: A | Discharge status: Home / Self Care | Payer: No Typology Code available for payment source | Source: Ambulatory Visit | Attending: Cardiovascular Disease | Admitting: Cardiovascular Disease

## 2023-08-03 DIAGNOSIS — I6522 Occlusion and stenosis of left carotid artery: Secondary | ICD-10-CM | POA: Diagnosis not present

## 2023-08-05 ENCOUNTER — Other Ambulatory Visit (HOSPITAL_COMMUNITY): Payer: Self-pay | Admitting: *Deleted

## 2023-08-05 DIAGNOSIS — I6522 Occlusion and stenosis of left carotid artery: Secondary | ICD-10-CM

## 2023-08-06 ENCOUNTER — Ambulatory Visit (HOSPITAL_COMMUNITY)
Admission: RE | Admit: 2023-08-06 | Discharge: 2023-08-06 | Disposition: A | Discharge status: Home / Self Care | Payer: No Typology Code available for payment source | Source: Ambulatory Visit | Attending: Cardiology | Admitting: Cardiology

## 2023-08-06 DIAGNOSIS — I10 Essential (primary) hypertension: Secondary | ICD-10-CM

## 2023-08-06 DIAGNOSIS — I739 Peripheral vascular disease, unspecified: Secondary | ICD-10-CM

## 2023-08-18 ENCOUNTER — Ambulatory Visit
Payer: No Typology Code available for payment source | Attending: Cardiovascular Disease | Admitting: Cardiovascular Disease

## 2023-08-19 ENCOUNTER — Encounter: Payer: Self-pay | Admitting: Cardiovascular Disease

## 2023-10-29 ENCOUNTER — Encounter: Payer: Self-pay | Admitting: Cardiovascular Disease

## 2024-01-04 ENCOUNTER — Other Ambulatory Visit (HOSPITAL_BASED_OUTPATIENT_CLINIC_OR_DEPARTMENT_OTHER): Payer: Self-pay | Admitting: Cardiovascular Disease

## 2024-01-19 ENCOUNTER — Encounter (HOSPITAL_COMMUNITY): Payer: Self-pay | Admitting: Cardiovascular Disease

## 2024-02-17 ENCOUNTER — Other Ambulatory Visit: Payer: Self-pay | Admitting: Student

## 2024-02-17 DIAGNOSIS — I739 Peripheral vascular disease, unspecified: Secondary | ICD-10-CM

## 2024-03-10 NOTE — Progress Notes (Signed)
 Cardiology Office Note:    Date:  03/22/2024   ID:  Brenda [name], DOB 1960/10/29, MRN [id number]  PCP:  [facility]  Cardiologist:  Deatrice Cage, MD     Referring MD: [facility]   Chief Complaint: routine follow-up of CAD, CHF, and PAD  History of Present Illness:    Brenda [name] is a 63 y.o. female with a history of mild non-obstructive CAD on remote cardiac catheterization in 05/2006, non-ischemic cardiomyopathy/ chronic HFrEF with EF as low as 35-40% in the remote past with subsequent normalization, extensive PAD with known renal artery stenosis (s/p stenting of right renal artery in 07/2006 complicated by wire perforation which was treated with coil embolization and then stenting of left renal artery and angioplasty of right renal artery in 09/2007 ), carotid/ subclavian artery stenosis (s/p right CEA in 07/2006, left CEA in 05/2007, and right carotid to subclavian artery bypass in 05/2008), and iliac/ SFA disease (s/p kissing balloon bifurcation stenting of right iliac artery in 12/2009 and stenting bilateral common iliac arteries in 05/2019), CVA, COPD, hypertension, hyperlipidemia, prediabetes, and anemia who is followed by Dr. Cage and presents today for routine follow-up.   Patient has a complex history as detailed above. He was admitted in 05/2006 with acute CHF and was found to have an EF of 35-40%. LHC at that time showed mild non-obstructive disease. EF subsequently normalized. He also has a history of extensive PAD involving the carotid/ subclavian arteries, renal arteries, and lower extremity arteries. He has undergone multiple interventions in the past as detailed above. It does not look like he has had any ischemic evaluation since cath in 2007. Last Echo in 03/2023 showed LVEF of 60-65% with mild LVH, normal RV size and function, mildly elevated PASP of 44.2 mmHg, and mild MR. Last lower extremity artery ultrasounds in 02/2023  showed stable >50% stenosis of bilateral common and external iliac arteries. ABIs were indicative of moderate disease on the right  (0.74) and mild disease on the left (0.94).  Last renal  dopplers in 07/2023 showed showed 1-59% stenosis of bilateral renal arteries  with patent stents with mild to moderate restenosis (unchanged from prior imaging). Last carotid dopplers in 07/2023 showed no evidence of stenosis in bilateral ICAs with patent right carotid endarterectomy and right carotid to subclavian bypass but did show significant stenosis of the left subclavian artery as well as right subclavian stenosis (stable from prior imaging).  Patient was last seen by me in 06/2023 at which time she was stable from a cardiac standpoint. Crestor  was increased for better cholesterol control. Repeat carotid and renal dopplers were ordered for routine monitoring and results are documented above.   Claudication if walk really long or fast in right but is not new and stable.   Patient presents today for follow-up. She had COVID last month She did have some shortness of breath with this and used some of the supplemental O2 that she still has at home. However, she has recovered well from this.  She denies any cardiac complaints. She denies any chest pain, shortness of breath (other than when she had COVID), orthopnea, or PND. She states she occasionally has some mild swelling in her ankles if she is on her feet a lot but nothing significant. No palpitations, lightheadedness/ dizziness, or syncope. She describes some claudication in her right leg when she walking for long distances or walks fast but this is not new and is stable. No  symptoms of subclavian steal syndrome on the left. She bruises easily on her arms but no abnormal bleeding in urine or stools.   EKGs/Labs/Other Studies Reviewed:    The following studies were reviewed:  Aorta/ IVC/ Iliac Ultrasounds and ABI/ TBI 02/18/2023: Summary: Unable to accurately  visualize stent struts, therefore all velocities are  listed in the native table. Essentially stable >50% stenosis of the  bilateral common and external iliac arteries.    ABI/ TBI Summary: Right: Resting right ankle-brachial index indicates moderate right lower  extremity arterial disease. The right toe-brachial index is abnormal.   Left: Resting left ankle-brachial index indicates mild left lower  extremity arterial disease. The left toe-brachial index is normal.  _______________   Echocardiogram 03/20/2023: Impressions: 1. Left ventricular ejection fraction, by estimation, is 60 to 65%. The  left ventricle has normal function. The left ventricle has no regional  wall motion abnormalities. There is mild left ventricular hypertrophy.  Left ventricular diastolic parameters  were normal.   2. Right ventricular systolic function is normal. The right ventricular  size is normal. There is mildly elevated pulmonary artery systolic  pressure. The estimated right ventricular systolic pressure is 44.2 mmHg.   3. The mitral valve is degenerative. Mild mitral valve regurgitation. No  evidence of mitral stenosis.   4. The aortic valve is grossly normal. Aortic valve regurgitation is not  visualized. Aortic valve sclerosis is present, with no evidence of aortic  valve stenosis.   5. Aortic Proximal ascending aorta is within normal limits.   6. The inferior vena cava is dilated in size with <50% respiratory  variability, suggesting right atrial pressure of 15 mmHg.  _______________  Carotid Dopplers 08/03/2023: Summary: - Right Carotid: There is no evidence of stenosis in the right ICA. Study  suggests  patent right carotid endaterectomy (2009) and patent right carotid to subclavian bypass. Right Graft:(s) Elevated velocities in subclavian and proximal anastamosis of BPG. Otherwise, patent BPG.  - Left Carotid: There is no evidence of stenosis in the left ICA. Hemodynamically significant plaque  >50% visualized in the CCA. Study suggests patent left carotid endarterectomy (2008). There is significant stenosis of the left subclavian artery. Overall, no significant change from 07/28/2022.   - Subclavians: Right subclavian artery was stenotic. Abnormal left subclavian arterial flow with retrograde vertebral artery. This most  likely  represents left subclavian steal syndrome.  _______________  Renal Dopplers 08/06/2023: Summary: Largest Aortic Diameter: 2.5 cm    Renal:  - Right: Normal size right kidney. Abnormal right Resistive Index. Normal cortical thickness of right kidney. RRV flow present. 1-59% stenosis of the right renal artery. Although stent struts not well seen, appears patent with mild to moderate restenosis.stent.  - Left:  Normal size of left kidney. Abnormal left Resisitve Index.  Normal cortical thickness of the left kidney. LRV flow present. 1-59% stenosis of the left renal artery. Although stent struts not well seen, stent appears patent with mild to moderate restenosis.    Essentially unchanged from prior exam in 02/2021. No change in renal size.   Mesenteric:  - Normal Celiac artery findings. 70 to 99% stenosis in the superior mesenteric artery.    EKG:  EKG ordered today.   EKG Interpretation Date/Time:  Tuesday March 22 2024 09:06:41 EDT Ventricular Rate:  75 PR Interval:  202 QRS Duration:  136 QT Interval:  436 QTC Calculation: 486 R Axis:   11  Text Interpretation: Normal sinus rhythm  LBBBB Cannot rule out Septal infarct ,  age undetermined No significant change since last tracing Confirmed by Tnia Anglada [phone]) on 03/22/2024 9:17:03 AM   Recent Labs: 06/18/2023: BUN 13; Creatinine, Ser 0.86; Hemoglobin 12.6; Platelets 465; Potassium 3.7; Sodium 139  Recent Lipid Panel    Component Value Date/Time   CHOL 135 01/05/2020 0000   TRIG 247 (H) 01/05/2020 0000   HDL 42 01/05/2020 0000   CHOLHDL 3.2 01/05/2020 0000   CHOLHDL 3.8 03/04/2016 0959    VLDL 32 (H) 03/04/2016 0959   LDLCALC 54 01/05/2020 0000    Physical Exam:    Vital Signs: BP (!) 130/58 (BP Location: Right Arm, Patient Position: Sitting)   Pulse 75   Ht 5' 4 (1.626 m)   Wt 197 lb (89.4 kg)   SpO2 93%   BMI 33.81 kg/m     Wt Readings from Last 3 Encounters:  03/22/24 197 lb (89.4 kg)  07/09/23 193 lb (87.5 kg)  03/19/23 198 lb (89.8 kg)     General: 63 y.o. female in no acute distress. HEENT: Normocephalic and atraumatic. Sclera clear.  Neck: Supple. Soft carotid bruits bilaterally.  No JVD. Heart: RRR. II- III/VI systolic murmur.  Lungs: No increased work of breathing. Clear to ausculation bilaterally. No wheezes, rhonchi, or rales.  Extremities: No lower extremity edema.   Skin: Warm and dry. Neuro: No focal deficits. Psych: Normal affect. Responds appropriately.  Assessment:    1. Non-obstructive CAD   2. Chronic HFrEF (heart failure with reduced ejection fraction) (HCC)   3. Non-ischemic cardiomyopathy (HCC)   4. Mild mitral regurgitation   5. PAD (peripheral artery disease)   6. Bilateral carotid artery stenosis   7. Subclavian artery disease   8. Renal artery stenosis   9. Primary hypertension   10. Hyperlipidemia, unspecified hyperlipidemia type   11. Medication management     Plan:    Non-Obstructive CAD Noted on remote cardiac catheterization in 05/2006. - No chest pain.  - Currently on DAPT with Aspirin  81mg  daily and Brilinta  60mg  twice daily for extensive PAD. - Continue high-sensitivity statin. - Will check CBC when she comes back in for fasting labs since she is on DAPT and has not had this checked since 06/2023.    Chronic HFrEF with Recovered EF Non-Ischemic Cardiomyopathy Initially diagnosed in 2007. EF was 35-40% at that time but has subsequently normalized. Last Echo in 03/2023 showed LVEF of 60-65% with mild LVH, normal RV size and function, mildly elevated PASP of 44.2 mmHg, and mild MR. - Euvolemic on exam.  -  Continue Candesartan  16mg  daily. - Continue Coreg  6.25mg  twice daily.    Mild Mitral Regurgitation Noted on Echo in 03/2023.  - Can continue routine surveillance. Consider repeat Echo in 03/2026 (or sooner based on symptoms).    PAD Carotid/ Subclavian Artery Disease Renal Artery Stenosis  Patient has extensive PAD involving the carotid/ subclavian arteries, renal arteries, and iliac/ SFA arteries and has had multiple interventions as detailed in HPI.  - She describes stable claudication in right left. - No symptoms of left subclavian steal syndrome. BP is much lower in the left arm. Recommended she check BP in the right arm going forward. - Continue DAPT with Aspirin  81mg  daily and Brilinta  60mg  twice daily. - Continue high-sensitivity statin.  - Most recent carotid dopplers and renal dopplers in 07/2023 showed stable disease. She is overdue for ultrasound of aorta/ IVC/ Iliacs and ABIs/ TBIs. Will order these today.    Hypertension BP well controlled.  - Continue current medications:  Amlodipine  10mg  daily, Candesartan  16mg  daily, and Coreg  6.25mg  twice daily.    Hyperlipidemia Lipid panel in 01/2023: Total Cholesterol 145, Triglycerides 137, HDL 51, LDL 70. LDL goal <55 given CAD and extensive PAD. - Crestor  was increased to 40mg  daily at last visit in 06/2023. Continue. - She is not fasting today. We will check fasting lipid panel and CMET when she comes in for lower extremity dopplers.   Disposition: Follow up in 6 months.    Signed, Aline FORBES Door, PA-C  03/22/2024 9:57 AM    [facility]

## 2024-03-22 ENCOUNTER — Ambulatory Visit: Attending: Student | Admitting: Student

## 2024-03-22 ENCOUNTER — Encounter: Payer: Self-pay | Admitting: Student

## 2024-03-22 VITALS — BP 130/58 | HR 75 | Ht 64.0 in | Wt 197.0 lb

## 2024-03-22 DIAGNOSIS — I5022 Chronic systolic (congestive) heart failure: Secondary | ICD-10-CM | POA: Diagnosis not present

## 2024-03-22 DIAGNOSIS — I6523 Occlusion and stenosis of bilateral carotid arteries: Secondary | ICD-10-CM

## 2024-03-22 DIAGNOSIS — I701 Atherosclerosis of renal artery: Secondary | ICD-10-CM

## 2024-03-22 DIAGNOSIS — I739 Peripheral vascular disease, unspecified: Secondary | ICD-10-CM

## 2024-03-22 DIAGNOSIS — I340 Nonrheumatic mitral (valve) insufficiency: Secondary | ICD-10-CM

## 2024-03-22 DIAGNOSIS — I428 Other cardiomyopathies: Secondary | ICD-10-CM | POA: Diagnosis not present

## 2024-03-22 DIAGNOSIS — Z79899 Other long term (current) drug therapy: Secondary | ICD-10-CM

## 2024-03-22 DIAGNOSIS — I2510 Atherosclerotic heart disease of native coronary artery without angina pectoris: Secondary | ICD-10-CM | POA: Diagnosis not present

## 2024-03-22 DIAGNOSIS — E785 Hyperlipidemia, unspecified: Secondary | ICD-10-CM

## 2024-03-22 DIAGNOSIS — I10 Essential (primary) hypertension: Secondary | ICD-10-CM

## 2024-03-22 NOTE — Patient Instructions (Addendum)
 Thank you for choosing [facility]!     Medication Instructions:  No medication changes were made during today's visit.  *If you need a refill on your cardiac medications before your next appointment, please call your pharmacy*   Lab Work: Return fasting the morning of the ultrasound for lab draw If you have labs (blood work) drawn today and your tests are completely normal, you will receive your results only by: MyChart Message (if you have MyChart) OR A paper copy in the mail If you have any lab test that is abnormal or we need to change your treatment, we will call you to review the results.   Testing/Procedures: Your physician has requested that you have an ankle brachial index (ABI). During this test an ultrasound and blood pressure cuff are used to evaluate the arteries that supply the arms and legs with blood. Allow thirty minutes for this exam. There are no restrictions or special instructions.   Your physician has requested that you have an abdominal aorta duplex. During this test, an ultrasound is used to evaluate the aorta. Allow 30 minutes for this exam. Do not eat after midnight the day before and avoid carbonated beverages.  Please note: We ask at that you not bring children with you during ultrasound (echo/ vascular) testing. Due to room size and safety concerns, children are not allowed in the ultrasound rooms during exams. Our front office staff cannot provide observation of children in our lobby area while testing is being conducted. An adult accompanying a patient to their appointment will only be allowed in the ultrasound room at the discretion of the ultrasound technician under special circumstances. We apologize for any inconvenience.     Your next appointment:   6 month(s)   Provider:   Deatrice Cage, MD or Callie Goddrich    Follow-Up: At [facility], you and your health needs are our priority.  As part of our continuing mission to  provide you with exceptional heart care, we have created designated Provider Care Teams.  These Care Teams include your primary Cardiologist (physician) and Advanced Practice Providers (APPs -  Physician Assistants and Nurse Practitioners) who all work together to provide you with the care you need, when you need it. We recommend signing up for the patient portal called MyChart.  Sign up information is provided on this After Visit Summary.  MyChart is used to connect with patients for Virtual Visits (Telemedicine).  Patients are able to view lab/test results, encounter notes, upcoming appointments, etc.  Non-urgent messages can be sent to your provider as well.   To learn more about what you can do with MyChart, go to [email or web address].

## 2024-03-22 NOTE — Addendum Note (Signed)
 Addended by: MALVINA CHARLENA CROME on: 03/22/2024 09:44 AM   Modules accepted: Orders

## 2024-04-21 ENCOUNTER — Other Ambulatory Visit: Payer: Self-pay | Admitting: *Deleted

## 2024-04-21 ENCOUNTER — Ambulatory Visit (HOSPITAL_COMMUNITY)
Admission: RE | Admit: 2024-04-21 | Discharge: 2024-04-21 | Disposition: A | Source: Ambulatory Visit | Attending: Student

## 2024-04-21 ENCOUNTER — Ambulatory Visit (HOSPITAL_COMMUNITY)
Admission: RE | Admit: 2024-04-21 | Discharge: 2024-04-21 | Disposition: A | Discharge status: Home / Self Care | Source: Ambulatory Visit | Attending: Student | Admitting: Student

## 2024-04-21 DIAGNOSIS — I739 Peripheral vascular disease, unspecified: Secondary | ICD-10-CM | POA: Insufficient documentation

## 2024-04-21 DIAGNOSIS — E785 Hyperlipidemia, unspecified: Secondary | ICD-10-CM

## 2024-04-21 DIAGNOSIS — Z95828 Presence of other vascular implants and grafts: Secondary | ICD-10-CM | POA: Diagnosis not present

## 2024-04-21 DIAGNOSIS — I6523 Occlusion and stenosis of bilateral carotid arteries: Secondary | ICD-10-CM

## 2024-04-21 DIAGNOSIS — I2510 Atherosclerotic heart disease of native coronary artery without angina pectoris: Secondary | ICD-10-CM

## 2024-04-21 LAB — COMPREHENSIVE METABOLIC PANEL WITH GFR
ALT: 11 IU/L (ref 0–32)
AST: 16 IU/L (ref 0–40)
Albumin: 4.3 g/dL (ref 3.9–4.9)
Alkaline Phosphatase: 89 IU/L (ref 49–135)
BUN/Creatinine Ratio: 9 — ABNORMAL LOW (ref 12–28)
BUN: 10 mg/dL (ref 8–27)
Bilirubin Total: 0.4 mg/dL (ref 0.0–1.2)
CO2: 21 mmol/L (ref 20–29)
Calcium: 9.8 mg/dL (ref 8.7–10.3)
Chloride: 105 mmol/L (ref 96–106)
Creatinine, Ser: 1.1 mg/dL — ABNORMAL HIGH (ref 0.57–1.00)
Globulin, Total: 2.7 g/dL (ref 1.5–4.5)
Glucose: 104 mg/dL — ABNORMAL HIGH (ref 70–99)
Potassium: 4.3 mmol/L (ref 3.5–5.2)
Sodium: 142 mmol/L (ref 134–144)
Total Protein: 7 g/dL (ref 6.0–8.5)
eGFR: 57 mL/min/1.73 — ABNORMAL LOW (ref >59)

## 2024-04-21 LAB — CBC
Hematocrit: 39.2 % (ref 34.0–46.6)
Hemoglobin: 11.7 g/dL (ref 11.1–15.9)
MCH: 23.4 pg — ABNORMAL LOW (ref 26.6–33.0)
MCHC: 29.8 g/dL — ABNORMAL LOW (ref 31.5–35.7)
MCV: 78 fL — ABNORMAL LOW (ref 79–97)
Platelets: 410 x10E3/uL (ref 150–450)
RBC: 5.01 x10E6/uL (ref 3.77–5.28)
RDW: 15.1 % (ref 11.7–15.4)
WBC: 11.4 x10E3/uL — ABNORMAL HIGH (ref 3.4–10.8)

## 2024-04-21 LAB — LIPID PANEL
Chol/HDL Ratio: 2.6 ratio (ref 0.0–4.4)
Cholesterol, Total: 116 mg/dL (ref 100–199)
HDL: 45 mg/dL (ref >39)
LDL Chol Calc (NIH): 50 mg/dL (ref 0–99)
Triglycerides: 115 mg/dL (ref 0–149)
VLDL Cholesterol Cal: 21 mg/dL (ref 5–40)

## 2024-04-21 LAB — VAS US ABI WITH/WO TBI
Left ABI: 0.92
Right ABI: 0.64

## 2024-04-24 ENCOUNTER — Ambulatory Visit: Payer: Self-pay | Admitting: Student

## 2024-05-02 NOTE — Progress Notes (Signed)
 Patient has been notified directly; all questions, if any, were answered. Patient voiced understanding.  Patient will follow up with pcp regarding wbc's.

## 2024-06-28 ENCOUNTER — Encounter (HOSPITAL_COMMUNITY): Payer: Self-pay

## 2024-06-29 ENCOUNTER — Other Ambulatory Visit (HOSPITAL_BASED_OUTPATIENT_CLINIC_OR_DEPARTMENT_OTHER): Payer: Self-pay | Admitting: Cardiovascular Disease

## 2024-07-13 ENCOUNTER — Ambulatory Visit (HOSPITAL_COMMUNITY)
Admission: RE | Admit: 2024-07-13 | Discharge: 2024-07-13 | Attending: Cardiovascular Disease | Admitting: Cardiovascular Disease

## 2024-07-13 DIAGNOSIS — I6522 Occlusion and stenosis of left carotid artery: Secondary | ICD-10-CM

## 2024-07-14 ENCOUNTER — Ambulatory Visit: Payer: Self-pay | Admitting: Cardiovascular Disease

## 2024-09-22 ENCOUNTER — Ambulatory Visit: Payer: Self-pay | Admitting: Student
